# Patient Record
Sex: Female | Born: 1965 | Race: White | Hispanic: No | Marital: Married | Smoking: Former smoker
Health system: Southern US, Community
[De-identification: ages and names within clinical notes are randomized; demographics above are authoritative.]

## PROBLEM LIST (undated history)

## (undated) DIAGNOSIS — Z973 Presence of spectacles and contact lenses: Secondary | ICD-10-CM

## (undated) DIAGNOSIS — G4730 Sleep apnea, unspecified: Secondary | ICD-10-CM

## (undated) DIAGNOSIS — M545 Low back pain: Secondary | ICD-10-CM

## (undated) DIAGNOSIS — N200 Calculus of kidney: Secondary | ICD-10-CM

## (undated) DIAGNOSIS — Z87442 Personal history of urinary calculi: Secondary | ICD-10-CM

## (undated) DIAGNOSIS — K219 Gastro-esophageal reflux disease without esophagitis: Secondary | ICD-10-CM

## (undated) DIAGNOSIS — N209 Urinary calculus, unspecified: Secondary | ICD-10-CM

## (undated) DIAGNOSIS — M1990 Unspecified osteoarthritis, unspecified site: Secondary | ICD-10-CM

## (undated) DIAGNOSIS — K8020 Calculus of gallbladder without cholecystitis without obstruction: Secondary | ICD-10-CM

## (undated) HISTORY — DX: Low back pain: M54.5

## (undated) HISTORY — DX: Calculus of kidney: N20.0

## (undated) HISTORY — PX: DILATION AND CURETTAGE OF UTERUS: SHX78

## (undated) HISTORY — PX: BREAST SURGERY: SHX581

## (undated) HISTORY — PX: TUBAL LIGATION: SHX77

## (undated) HISTORY — DX: Urinary calculus, unspecified: N20.9

---
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
## 1995-05-21 HISTORY — PX: LUMBAR DISC SURGERY: SHX700

## 2000-05-20 HISTORY — PX: OTHER SURGICAL HISTORY: SHX169

## 2004-06-06 ENCOUNTER — Ambulatory Visit: Payer: Self-pay | Admitting: Internal Medicine

## 2004-07-17 ENCOUNTER — Ambulatory Visit: Payer: Self-pay | Admitting: Internal Medicine

## 2004-08-27 ENCOUNTER — Emergency Department: Payer: Self-pay | Admitting: Emergency Medicine

## 2005-02-01 ENCOUNTER — Ambulatory Visit: Payer: Self-pay | Admitting: Family Medicine

## 2005-04-25 ENCOUNTER — Other Ambulatory Visit: Admission: RE | Admit: 2005-04-25 | Discharge: 2005-04-25 | Payer: Self-pay | Admitting: Obstetrics and Gynecology

## 2006-02-17 LAB — HM MAMMOGRAPHY: HM Mammogram: NORMAL

## 2006-03-04 ENCOUNTER — Encounter: Admission: RE | Admit: 2006-03-04 | Discharge: 2006-03-04 | Payer: Self-pay | Admitting: Obstetrics and Gynecology

## 2006-04-17 ENCOUNTER — Other Ambulatory Visit: Admission: RE | Admit: 2006-04-17 | Discharge: 2006-04-17 | Payer: Self-pay | Admitting: Obstetrics and Gynecology

## 2006-05-20 HISTORY — PX: BLADDER SUSPENSION: SHX72

## 2006-08-18 ENCOUNTER — Ambulatory Visit (HOSPITAL_COMMUNITY): Admission: RE | Admit: 2006-08-18 | Discharge: 2006-08-19 | Payer: Self-pay | Admitting: Obstetrics and Gynecology

## 2006-10-15 ENCOUNTER — Encounter: Payer: Self-pay | Admitting: Family Medicine

## 2007-02-25 ENCOUNTER — Ambulatory Visit: Payer: Self-pay | Admitting: Family Medicine

## 2007-02-25 DIAGNOSIS — M542 Cervicalgia: Secondary | ICD-10-CM | POA: Insufficient documentation

## 2007-02-25 DIAGNOSIS — N209 Urinary calculus, unspecified: Secondary | ICD-10-CM | POA: Insufficient documentation

## 2007-02-25 DIAGNOSIS — R0789 Other chest pain: Secondary | ICD-10-CM

## 2007-02-26 ENCOUNTER — Encounter: Payer: Self-pay | Admitting: Family Medicine

## 2007-03-02 ENCOUNTER — Ambulatory Visit: Payer: Self-pay | Admitting: Family Medicine

## 2007-03-03 LAB — CONVERTED CEMR LAB
Bilirubin, Direct: 0.1 mg/dL (ref 0.0–0.3)
Calcium: 9 mg/dL (ref 8.4–10.5)
Cholesterol: 178 mg/dL (ref 0–200)
GFR calc Af Amer: 89 mL/min
GFR calc non Af Amer: 74 mL/min
Glucose, Bld: 88 mg/dL (ref 70–99)
LDL Cholesterol: 101 mg/dL — ABNORMAL HIGH (ref 0–99)
Potassium: 4.7 meq/L (ref 3.5–5.1)
Sodium: 145 meq/L (ref 135–145)
Total CHOL/HDL Ratio: 3.7

## 2007-04-07 ENCOUNTER — Encounter: Admission: RE | Admit: 2007-04-07 | Discharge: 2007-04-07 | Payer: Self-pay | Admitting: Obstetrics and Gynecology

## 2007-04-29 DIAGNOSIS — N3000 Acute cystitis without hematuria: Secondary | ICD-10-CM | POA: Insufficient documentation

## 2007-05-02 ENCOUNTER — Encounter: Payer: Self-pay | Admitting: Family Medicine

## 2007-05-02 ENCOUNTER — Ambulatory Visit: Payer: Self-pay | Admitting: Family Medicine

## 2007-05-02 LAB — CONVERTED CEMR LAB
Bilirubin Urine: NEGATIVE
Blood in Urine, dipstick: NEGATIVE
Glucose, Urine, Semiquant: NEGATIVE
Ketones, urine, test strip: NEGATIVE
Specific Gravity, Urine: <1.005
pH: 7

## 2007-05-30 ENCOUNTER — Ambulatory Visit: Payer: Self-pay | Admitting: Family Medicine

## 2007-05-30 DIAGNOSIS — G4730 Sleep apnea, unspecified: Secondary | ICD-10-CM | POA: Insufficient documentation

## 2007-05-30 DIAGNOSIS — J069 Acute upper respiratory infection, unspecified: Secondary | ICD-10-CM | POA: Insufficient documentation

## 2007-06-03 ENCOUNTER — Ambulatory Visit: Payer: Self-pay | Admitting: Family Medicine

## 2007-10-07 ENCOUNTER — Emergency Department (HOSPITAL_COMMUNITY): Admission: EM | Admit: 2007-10-07 | Discharge: 2007-10-07 | Payer: Self-pay | Admitting: Family Medicine

## 2007-10-07 ENCOUNTER — Encounter: Admission: RE | Admit: 2007-10-07 | Discharge: 2007-10-07 | Payer: Self-pay | Admitting: Obstetrics and Gynecology

## 2008-02-17 ENCOUNTER — Ambulatory Visit: Payer: Self-pay | Admitting: Family Medicine

## 2008-02-17 DIAGNOSIS — S139XXA Sprain of joints and ligaments of unspecified parts of neck, initial encounter: Secondary | ICD-10-CM

## 2008-04-25 ENCOUNTER — Encounter: Admission: RE | Admit: 2008-04-25 | Discharge: 2008-04-25 | Payer: Self-pay | Admitting: Obstetrics and Gynecology

## 2008-05-23 ENCOUNTER — Ambulatory Visit: Payer: Self-pay | Admitting: Family Medicine

## 2008-05-23 DIAGNOSIS — Z87442 Personal history of urinary calculi: Secondary | ICD-10-CM

## 2008-05-23 DIAGNOSIS — R1011 Right upper quadrant pain: Secondary | ICD-10-CM

## 2008-05-23 LAB — CONVERTED CEMR LAB
ALT: 48 units/L — ABNORMAL HIGH (ref 0–35)
AST: 40 units/L — ABNORMAL HIGH (ref 0–37)
Basophils Relative: 0.2 % (ref 0.0–3.0)
Bilirubin, Direct: 0.1 mg/dL (ref 0.0–0.3)
CO2: 27 meq/L (ref 19–32)
Calcium: 9.6 mg/dL (ref 8.4–10.5)
Chloride: 105 meq/L (ref 96–112)
Creatinine, Ser: 0.8 mg/dL (ref 0.4–1.2)
Glucose, Bld: 88 mg/dL (ref 70–99)
Hemoglobin: 15.9 g/dL — ABNORMAL HIGH (ref 12.0–15.0)
Lymphocytes Relative: 25.4 % (ref 12.0–46.0)
Monocytes Relative: 13.9 % — ABNORMAL HIGH (ref 3.0–12.0)
Neutro Abs: 5.8 10*3/uL (ref 1.4–7.7)
Neutrophils Relative %: 58.3 % (ref 43.0–77.0)
RBC: 4.92 M/uL (ref 3.87–5.11)
Sodium: 139 meq/L (ref 135–145)
Total Protein: 7.3 g/dL (ref 6.0–8.3)

## 2008-05-24 ENCOUNTER — Encounter: Payer: Self-pay | Admitting: Family Medicine

## 2008-05-24 ENCOUNTER — Encounter: Admission: RE | Admit: 2008-05-24 | Discharge: 2008-05-24 | Payer: Self-pay | Admitting: Family Medicine

## 2008-05-27 ENCOUNTER — Ambulatory Visit: Payer: Self-pay | Admitting: Family Medicine

## 2008-05-27 DIAGNOSIS — R740 Nonspecific elevation of levels of transaminase and lactic acid dehydrogenase [LDH]: Secondary | ICD-10-CM

## 2008-06-10 ENCOUNTER — Ambulatory Visit: Payer: Self-pay | Admitting: Family Medicine

## 2008-06-14 LAB — CONVERTED CEMR LAB
AST: 15 units/L (ref 0–37)
Albumin: 4.2 g/dL (ref 3.5–5.2)
Alkaline Phosphatase: 91 units/L (ref 39–117)
HCV Ab: NEGATIVE
Hepatitis B Surface Ag: NEGATIVE
Indirect Bilirubin: 0.2 mg/dL (ref 0.0–0.9)
Total Bilirubin: 0.3 mg/dL (ref 0.3–1.2)

## 2009-04-27 ENCOUNTER — Ambulatory Visit: Payer: Self-pay | Admitting: Family Medicine

## 2009-04-27 DIAGNOSIS — R197 Diarrhea, unspecified: Secondary | ICD-10-CM

## 2009-04-27 DIAGNOSIS — R109 Unspecified abdominal pain: Secondary | ICD-10-CM | POA: Insufficient documentation

## 2009-04-27 LAB — CONVERTED CEMR LAB
Ketones, urine, test strip: NEGATIVE
Nitrite: NEGATIVE
Protein, U semiquant: NEGATIVE
Specific Gravity, Urine: 1.01
Urobilinogen, UA: 0.2
WBC Urine, dipstick: NEGATIVE

## 2009-04-28 LAB — CONVERTED CEMR LAB
ALT: 28 units/L (ref 0–35)
BUN: 10 mg/dL (ref 6–23)
Bilirubin, Direct: 0 mg/dL (ref 0.0–0.3)
CO2: 27 meq/L (ref 19–32)
Chloride: 104 meq/L (ref 96–112)
Creatinine, Ser: 0.9 mg/dL (ref 0.4–1.2)
Eosinophils Absolute: 0.2 10*3/uL (ref 0.0–0.7)
Eosinophils Relative: 2 % (ref 0.0–5.0)
Glucose, Bld: 83 mg/dL (ref 70–99)
HCT: 44.1 % (ref 36.0–46.0)
Lipase: 12 units/L (ref 11.0–59.0)
Lymphs Abs: 2.5 10*3/uL (ref 0.7–4.0)
MCHC: 33.9 g/dL (ref 30.0–36.0)
MCV: 94.7 fL (ref 78.0–100.0)
Monocytes Absolute: 1 10*3/uL (ref 0.1–1.0)
Neutrophils Relative %: 60.4 % (ref 43.0–77.0)
Platelets: 340 10*3/uL (ref 150.0–400.0)
Potassium: 4.5 meq/L (ref 3.5–5.1)
TSH: 1.83 microintl units/mL (ref 0.35–5.50)
Total Bilirubin: 0.8 mg/dL (ref 0.3–1.2)
Total Protein: 7 g/dL (ref 6.0–8.3)

## 2009-06-19 ENCOUNTER — Encounter: Admission: RE | Admit: 2009-06-19 | Discharge: 2009-06-19 | Payer: Self-pay | Admitting: Obstetrics and Gynecology

## 2009-11-14 ENCOUNTER — Telehealth: Payer: Self-pay | Admitting: Family Medicine

## 2010-06-09 ENCOUNTER — Other Ambulatory Visit: Payer: Self-pay | Admitting: Obstetrics and Gynecology

## 2010-06-09 DIAGNOSIS — Z1239 Encounter for other screening for malignant neoplasm of breast: Secondary | ICD-10-CM

## 2010-06-19 NOTE — Progress Notes (Signed)
Summary: abdominal pain, nausea  Phone Note Call from Patient Call back at Work Phone [phone]   Caller: Patient Call For: Kerby Nora MD Summary of Call: Patient called asking for an appt for today. She is having abdominal pain that radiates to her side, nausea, and lots of burping. She says that it is the same pain she was having on 04-27-09 when she was seen. Can you see her today? Initial call taken by: Melody Comas,  November 14, 2009 9:36 AM  Follow-up for Phone Call        Add her on to today's schedule..see if she can come early and wait as we try to work her in.  Follow-up by: Kerby Nora MD,  November 14, 2009 1:09 PM  Additional Follow-up for Phone Call Additional follow up Details #1::        Tried calling patient at work number that she left me, they said seh had left for the day. Looked in ID[employer] for another contact, found cell number and got no answer. Will try again later.  Melody Comas  November 14, 2009 1:17 PM   Tried calling again, left message w/ daughter for her to return my call.  Additional Follow-up by: Melody Comas,  November 14, 2009 2:00 PM

## 2010-06-25 ENCOUNTER — Ambulatory Visit
Admission: RE | Admit: 2010-06-25 | Discharge: 2010-06-25 | Disposition: A | Discharge status: Home / Self Care | Payer: 59 | Source: Ambulatory Visit | Attending: Obstetrics and Gynecology | Admitting: Obstetrics and Gynecology

## 2010-06-25 DIAGNOSIS — Z1239 Encounter for other screening for malignant neoplasm of breast: Secondary | ICD-10-CM

## 2010-10-05 NOTE — Op Note (Signed)
Kelsey Carlson, Kelsey Carlson             ACCOUNT NO.:  [id number]   MEDICAL RECORD NO.:  [id number]          PATIENT TYPE:  OIB   LOCATION:  9316                          FACILITY:  [facility]   PHYSICIAN:  Osborn Coho, M.D.   DATE OF BIRTH:  [birth year]   DATE OF PROCEDURE:  08/18/2006  DATE OF DISCHARGE:  08/19/2006                               OPERATIVE REPORT   PREOPERATIVE DIAGNOSIS:  Stress urinary incontinence.   POSTOPERATIVE DIAGNOSIS:  Stress urinary incontinence.   PROCEDURE:  1. TVT.  2. Cystoscopy.   ATTENDING:  Osborn Coho, M.D.   ASSISTANT:  Dois Davenport A. Rivard, M.D.   ANESTHESIA:  General with endotracheal tube.   FLUIDS:  1000 mL.   URINE OUTPUT:  50 mL.   ESTIMATED BLOOD LOSS:  50 mL   COMPLICATIONS:  None.   PROCEDURE:  The patient is taken to the operating room after risks,  benefits, alternatives reviewed with the patient.  The patient  verbalized understanding and consent signed and witnessed.  The patient  was placed under general anesthesia and prepped and draped in normal  sterile fashion in the dorsal lithotomy position.  The anterior vaginal  wall was injected with Pitressin and a weighted speculum placed.  The  dilute Pitressin was injected at the level of the mid urethra in the  anterior vaginal wall.  The mons pubis was identified and two  fingerbreadths from the midline bilaterally at the upper edge of the  symphysis pubis two 5 mm incisions were made.  An incision was made as  well for approximately a centimeter along the mid urethra.  The anterior  vaginal wall was dissected away from the underlying tissue bilaterally  to the lower border of the symphysis pubis.  The bladder was emptied  completely and a rigid urethral catheter guide and an 18-French Foley  was deflected to the patient's right and the transabdominal guide passed  from the mons pubis incision on the ipsilateral side through the  anterior vaginal wall where the dissected path was  created.  The same  was done on the contralateral side.  Cystoscopy was performed and no  inadvertent bladder injury was noted.  The mesh was then going to be  attached to the transabdominal guide on the right however, it was noted  that the guide had punctured through the anterior vaginal wall therefore  the transabdominal guide was pulled back just a little bit and placed  through the appropriate path and the mesh was attached.  The bladder was  emptied completely and again the rigid urethral guide in the 18-French  Foley was deflected to the patient's right and mesh elevated through the  space of Retzius and out through the incision on the mons pubis.  This  was done on the contralateral side as well.  Cystoscopy was performed  once again and no inadvertent bladder injury was noted.  The ureters  were noted to efflux bilaterally after administration of indigo carmine.  A large Jenaveve was placed beneath the urethra and between the urethra and  mesh in order to keep it somewhat slack beneath the  mid urethra and the  plastic sheath was removed bilaterally.  The Foley was then placed into  the bladder; however, prior to leaving the Foley in, cystoscopy was  performed once again and no bladder injury noted.  The Foley was then  placed in the bladder and attached to the Foley bag to gravity.  Each  time there was entry into the bladder with either the catheter or  cystoscope, the catheter and cystoscope was prepped with Betadine as  well as the urethra.  The mesh was then cut flush with the incision  bilaterally at the mons pubis and the anterior vaginal wall was repaired  with 2-0 Vicryl via a running interlocking stitch.  The puncture site in  the right anterior vaginal wall was repaired with 2-0 Vicryl via a  running interlocking stitch as well.  The vagina was packed with a 1-  inch packing soaked with estrogen and the mons pubis incisions were  repaired with Dermabond.  Sponge, lap and  needle count was correct.  The  patient tolerated procedure well and was returned to recovery room in  good condition.      Osborn Coho, M.D.  Electronically Signed     AR/MEDQ  D:  08/20/2006  T:  08/20/2006  Job:  [id number]

## 2010-10-05 NOTE — Discharge Summary (Signed)
Kelsey Carlson, Kelsey Carlson             ACCOUNT NO.:  [id number]   MEDICAL RECORD NO.:  [id number]          PATIENT TYPE:  OIB   LOCATION:  9316                          FACILITY:  [facility]   PHYSICIAN:  Osborn Coho, M.D.   DATE OF BIRTH:  [birth year]   DATE OF ADMISSION:  08/18/2006  DATE OF DISCHARGE:  08/19/2006                               DISCHARGE SUMMARY   DISCHARGE DIAGNOSIS:  Stress urinary incontinence.   OPERATION:  On the day of admission the patient underwent the placement  of tension-free vaginal tape and cystoscopy tolerating procedure well.   HISTORY OF PRESENT ILLNESS:  Kelsey Carlson is a 45 year old female  gravida 3, para 2 who presents with symptoms of stress urinary  incontinence requesting surgical intervention.  Please see the patient's  dictated history and physical examination for details.  General exam is  within normal limits.  Pelvic exam external genitalia within normal  limits.  There is no obvious urethrocele however, a small cystocele is  beginning.  There is no rectocele.  Uterus is normal size, nontender  without any palpable adnexal masses.   HOSPITAL COURSE:  On the day of admission the patient underwent  aforementioned procedures tolerating them all well.  By postop day #1  the patient had resumed bowel and bladder function and was therefore  deemed ready for discharge home.   DISCHARGE MEDICATIONS:  Motrin (over-the-counter) as needed and as  directed.  Vicodin 1-2 tablets every 4 hours as needed for breakthrough  bleeding, amoxicillin 300 mg three times daily for 5 days.   FOLLOW UP:  The patient is scheduled for 6 weeks postoperative visit  with Dr. Osborn Coho.  She is to call with [facility] at  [phone] to schedule that appointment.   DISCHARGE INSTRUCTIONS:  The patient was given a copy of [facility] postoperative instruction sheet.  She was further  advised to avoid driving for 24 hours, lifting for 6 weeks  anything  greater than 10-15 pounds, intercourse for 6 weeks, that she may shower.  She should increase her activities slowly.  Her diet was without  restriction.      Kelsey Carlson.      Osborn Coho, M.D.  Electronically Signed    EJP/MEDQ  D:  09/19/2006  T:  09/19/2006  Job:  [id number]

## 2010-10-05 NOTE — H&P (Signed)
Kelsey Carlson, Kelsey Carlson             ACCOUNT NO.:  [id number]   MEDICAL RECORD NO.:  [id number]          PATIENT TYPE:  OIB   LOCATION:  9316                          FACILITY:  [facility]   PHYSICIAN:  Osborn Coho, M.D.   DATE OF BIRTH:  1965/07/05   DATE OF ADMISSION:  08/18/2006  DATE OF DISCHARGE:  08/19/2006                              HISTORY & PHYSICAL   CHIEF COMPLAINT:  Leaking urine with activity, coughing, laughing and  sneezing.   HISTORY:  Ms. Maul is a 45 year old gravida 3, para 2, with last  menstrual period of August 06, 2006, with complaints of leaking urine  with activity, coughing, laughing and sneezing.  Options were discussed  and the patient would like to proceed with surgical management.   PAST OBSTETRICAL HISTORY:  1. Normal spontaneous vaginal delivery full-term x2.  2. Spontaneous abortion x1, first trimester.   PAST GYNECOLOGIC HISTORY:  History of regular menses, denies gonorrhea,  chlamydia.  Reports a positive history of yeast infections and UTIs.  Denies history of abnormal Pap smear.   PAST MEDICAL HISTORY:  Denies.   PAST SURGICAL HISTORY:  1. 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  2. Back surgery secondary to herniated disk.  3. 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  4. Wisdom teeth extraction.  5. D&C.   MEDICATIONS:  Mircette and Vicodin p.r.n.   ALLERGIES:  BACTRIM (upset stomach).   SOCIAL HISTORY:  Denies cigarette use, rare alcohol use, and no drug  use.  Lives with husband and two children and brother.   FAMILY HISTORY:  Mother died of breast cancer at 41 years old.   REVIEW OF SYSTEMS:  Denies chest pain or shortness of breath with  activity.  Denies any fever, chills, nausea or vomiting, GI or GU  problems except for what was mentioned above.   PHYSICAL EXAMINATION:  HEENT:  Within normal limits.  NECK:  Thyroid not enlarged.  CARDIAC:  Heart rate and rhythm are regular.  CHEST:  Clear to auscultation  bilaterally.  BREASTS:  No masses, discharge, skin changes or nipple retraction.  BACK:  No CVA tenderness.  ABDOMEN:  Soft and nontender.  EXTREMITIES:  Within normal limits.  PELVIC:  External genitalia within normal limits.  No obvious  urethrocele, however a small cystocele beginning.  No rectocele.  Uterus  normal size, nontender, no palpable adnexal masses.   Office cystometrics:  PVR less than 5 mL.  Q-Tip baseline 30 mL and  going to 60 degrees in addition to the 30, leak with cough at 60 mL.  Bladder capacity greater than 300 mL.   ASSESSMENT AND PLAN:  Ms. Buxbaum is a 45 year old gravida 3, para 2,  with stress urinary incontinence desiring surgical management.  The  risks, benefits, and alternatives have been discussed with the patient  including but not limited to bleeding, infection, injury to adjacent  organs, erosion of mesh, and urinary retention.  The patient verbalized  understanding, questions have been answered, and consent to be signed  and witnessed the day of procedure in hospital.      Osborn Coho,  M.D.  Electronically Signed     AR/MEDQ  D:  08/20/2006  T:  08/20/2006  Job:  [id number]

## 2010-10-22 ENCOUNTER — Ambulatory Visit (INDEPENDENT_AMBULATORY_CARE_PROVIDER_SITE_OTHER): Payer: Self-pay | Admitting: Family Medicine

## 2010-10-22 ENCOUNTER — Encounter: Payer: Self-pay | Admitting: Family Medicine

## 2010-10-22 DIAGNOSIS — R0789 Other chest pain: Secondary | ICD-10-CM

## 2010-10-22 DIAGNOSIS — R079 Chest pain, unspecified: Secondary | ICD-10-CM

## 2010-10-22 DIAGNOSIS — R5383 Other fatigue: Secondary | ICD-10-CM

## 2010-10-22 DIAGNOSIS — Z136 Encounter for screening for cardiovascular disorders: Secondary | ICD-10-CM

## 2010-10-22 LAB — COMPREHENSIVE METABOLIC PANEL
ALT: 18 U/L (ref 0–35)
AST: 17 U/L (ref 0–37)
Alkaline Phosphatase: 61 U/L (ref 39–117)
BUN: 11 mg/dL (ref 6–23)
Calcium: 9.3 mg/dL (ref 8.4–10.5)
Chloride: 107 mEq/L (ref 96–112)
Creatinine, Ser: 0.9 mg/dL (ref 0.4–1.2)
GFR: 69.56 mL/min (ref >60.00)
Potassium: 4.6 mEq/L (ref 3.5–5.1)

## 2010-10-22 LAB — CBC WITH DIFFERENTIAL/PLATELET
Basophils Absolute: 0 10*3/uL (ref 0.0–0.1)
Hemoglobin: 14.8 g/dL (ref 12.0–15.0)
Lymphocytes Relative: 27 % (ref 12.0–46.0)
Monocytes Relative: 9.6 % (ref 3.0–12.0)
Neutrophils Relative %: 61.2 % (ref 43.0–77.0)
Platelets: 350 10*3/uL (ref 150.0–400.0)
RDW: 12.3 % (ref 11.5–14.6)

## 2010-10-22 LAB — TSH: TSH: 1.96 u[IU]/mL (ref 0.35–5.50)

## 2010-10-22 LAB — VITAMIN B12: Vitamin B-12: 193 pg/mL — ABNORMAL LOW (ref 211–911)

## 2010-10-22 NOTE — Progress Notes (Signed)
Subjective:    Patient ID: Kelsey Carlson, female    DOB: [birth year], 45 y.o.   MRN: [id number]  Chest Pain  This is a new (HAs been tired for 3 weeks, had noticed lid puffiness of ankles) problem. The current episode started yesterday. The onset quality is gradual (left chest achiness). The problem occurs intermittently. The problem has been unchanged. The pain is present in the substernal region. The pain is mild. Quality: achiness lasts several hours. The pain radiates to the left arm (feels pressure). Associated symptoms include back pain, dizziness, nausea and shortness of breath. Pertinent negatives include no cough, exertional chest pressure, fever, headaches, near-syncope, numbness, orthopnea, palpitations, syncope, vomiting or weakness. Associated symptoms comments: Has recently been on estrogen patch  No association with exertion or eating. Woke up this AM with achiness in chest already present Feels at little anxious today, but no recent anxiety or depression. The pain is aggravated by nothing. She has tried nothing for the symptoms. Risk factors include obesity, smoking/tobacco exposure, sedentary lifestyle and oral contraceptive use (was on estrogen patch for menstrual migrane last week).  Pertinent negatives for past medical history include no CAD, no COPD, no diabetes, no hyperlipidemia, no hypertension, no MI, no PE, no PVD, no thyroid problem and no TIA.  Her family medical history is significant for hyperlipidemia in family and hypertension in family.  Pertinent negatives for family medical history include: no CAD in family, no diabetes in family, no early MI in family, no PE in family and no PVD in family.      Review of Systems  Constitutional: Negative for fever.  HENT: Negative for congestion and postnasal drip.   Respiratory: Positive for shortness of breath. Negative for cough.   Cardiovascular: Positive for chest pain. Negative for palpitations, orthopnea, syncope and  near-syncope.  Gastrointestinal: Positive for nausea. Negative for vomiting, diarrhea and constipation.       No GERD, occ has gallbladder pain  Genitourinary: Negative for dysuria.  Musculoskeletal: Positive for back pain.  Neurological: Positive for dizziness. Negative for weakness, numbness and headaches.       Objective:   Physical Exam  Constitutional: Vital signs are normal. She appears well-developed and well-nourished. She is cooperative.  Non-toxic appearance. She does not appear ill. No distress.  HENT:  Head: Normocephalic.  Right Ear: Hearing, tympanic membrane, external ear and ear canal normal. Tympanic membrane is not erythematous, not retracted and not bulging.  Left Ear: Hearing, tympanic membrane, external ear and ear canal normal. Tympanic membrane is not erythematous, not retracted and not bulging.  Nose: No mucosal edema or rhinorrhea. Right sinus exhibits no maxillary sinus tenderness and no frontal sinus tenderness. Left sinus exhibits no maxillary sinus tenderness and no frontal sinus tenderness.  Mouth/Throat: Uvula is midline, oropharynx is clear and moist and mucous membranes are normal.  Eyes: Conjunctivae, EOM and lids are normal. Pupils are equal, round, and reactive to light. No foreign bodies found.  Neck: Trachea normal and normal range of motion. Neck supple. Carotid bruit is not present. No mass and no thyromegaly present.  Cardiovascular: Normal rate, regular rhythm, S1 normal, S2 normal, normal heart sounds, intact distal pulses and normal pulses.  Exam reveals no gallop and no friction rub.   No murmur heard. Pulmonary/Chest: Effort normal and breath sounds normal. Not tachypneic. No respiratory distress. She has no decreased breath sounds. She has no wheezes. She has no rhonchi. She has no rales.  Abdominal: Soft. Normal appearance and bowel sounds are  normal. There is no splenomegaly or hepatomegaly. There is tenderness in the right upper quadrant and  epigastric area.  Neurological: She is alert.  Skin: Skin is warm, dry and intact. No rash noted.  Psychiatric: Her speech is normal and behavior is normal. Judgment and thought content normal. Her mood appears not anxious. Cognition and memory are normal. She does not exhibit a depressed mood.          Assessment & Plan:

## 2010-10-22 NOTE — Patient Instructions (Signed)
If severe shortness of breath or chest pain, go to ER. We will call you with lab results.  Start prilosec 40 mg daily for possible GI symptoms. Call if symptoms not improving in next 1-2 weeks for further evaluation.

## 2010-10-22 NOTE — Assessment & Plan Note (Addendum)
Lower risk for CAD (no fam hx MI, no DM, no high chol, former smoker, no HTN, overweight) Description of pain is non exertional, but she is also having some shortness of breath with exertion and fatigue in last few weeks. On exam only finding is mild epigastric ttp. EKG showed NSR, no ST changes, no Q waves, no LVH.  Will eval fatigue/SOB with labs.  Start on prilosec 40 mg daily for possible GI source of symptoms.

## 2011-01-03 ENCOUNTER — Other Ambulatory Visit: Payer: Self-pay | Admitting: Obstetrics and Gynecology

## 2011-01-03 DIAGNOSIS — N63 Unspecified lump in unspecified breast: Secondary | ICD-10-CM

## 2011-01-09 ENCOUNTER — Ambulatory Visit
Admission: RE | Admit: 2011-01-09 | Discharge: 2011-01-09 | Disposition: A | Discharge status: Home / Self Care | Payer: 59 | Source: Ambulatory Visit | Attending: Obstetrics and Gynecology | Admitting: Obstetrics and Gynecology

## 2011-01-09 DIAGNOSIS — N63 Unspecified lump in unspecified breast: Secondary | ICD-10-CM

## 2011-02-13 LAB — POCT URINALYSIS DIP (DEVICE)
Bilirubin Urine: NEGATIVE
Glucose, UA: NEGATIVE
Nitrite: POSITIVE — AB
Operator id: 247071
Urobilinogen, UA: 0.2

## 2011-02-13 LAB — URINE CULTURE: Colony Count: >=100000

## 2011-07-11 ENCOUNTER — Other Ambulatory Visit: Payer: Self-pay | Admitting: Obstetrics and Gynecology

## 2011-07-11 DIAGNOSIS — Z1231 Encounter for screening mammogram for malignant neoplasm of breast: Secondary | ICD-10-CM

## 2011-07-24 ENCOUNTER — Ambulatory Visit
Admission: RE | Admit: 2011-07-24 | Discharge: 2011-07-24 | Disposition: A | Discharge status: Home / Self Care | Payer: 59 | Source: Ambulatory Visit | Attending: Obstetrics and Gynecology | Admitting: Obstetrics and Gynecology

## 2011-07-24 DIAGNOSIS — Z1231 Encounter for screening mammogram for malignant neoplasm of breast: Secondary | ICD-10-CM

## 2011-09-19 ENCOUNTER — Ambulatory Visit: Payer: Self-pay | Admitting: Obstetrics and Gynecology

## 2011-10-02 ENCOUNTER — Encounter: Payer: Self-pay | Admitting: Family Medicine

## 2011-10-02 ENCOUNTER — Ambulatory Visit (INDEPENDENT_AMBULATORY_CARE_PROVIDER_SITE_OTHER): Payer: 59 | Admitting: Family Medicine

## 2011-10-02 VITALS — BP 110/70 | HR 90 | Temp 98.3°F | Ht 64.5 in | Wt 187.8 lb

## 2011-10-02 DIAGNOSIS — J069 Acute upper respiratory infection, unspecified: Secondary | ICD-10-CM

## 2011-10-02 MED ORDER — HYDROCODONE-HOMATROPINE 5-1.5 MG/5ML PO SYRP
ORAL_SOLUTION | ORAL | Status: AC
Start: 2011-10-02 — End: 2011-10-12

## 2011-10-02 NOTE — Progress Notes (Signed)
  Patient Name: Kelsey Carlson Date of Birth: 1965-07-27 Age: 46 y.o. Medical Record Number: [id number] Gender: female Date of Encounter: 10/02/2011  History of Present Illness:  Kelsey Carlson is a 46 y.o. very pleasant female patient who presents with the following:  Chest is tight and cannot breathe. Nonproductive   No asthma history. No smoker. Quit smoking in 20's Sick for almost a week  Pleasant lady with a history of a nonproductive cough. Some chest tightness. No significant fever. Minimal rhinorrhea. No significant sore throat or earache. No nausea, vomiting, or diarrhea.  Past Medical History, Surgical History, Social History, Family History, Problem List, Medications, and Allergies have been reviewed and updated if relevant.  Review of Systems: ROS: GEN: Acute illness details above GI: Tolerating PO intake GU: maintaining adequate hydration and urination Pulm: No SOB Interactive and getting along well at home.  Otherwise, ROS is as per the HPI.   Physical Examination: Filed Vitals:   10/02/11 1144  BP: 110/70  Pulse: 90  Temp: 98.3 F (36.8 C)  TempSrc: Oral  Height: 5' 4.5" (1.638 m)  Weight: 187 lb 12.8 oz (85.186 kg)  SpO2: 90%   Pulse ox 97%  Kelsey Schwall, MD 10/02/2011, 12:31 PM   Body mass index is 31.74 kg/(m^2).   Gen: WDWN, NAD; A & O x3, cooperative. Pleasant.Globally Non-toxic HEENT: Normocephalic and atraumatic. Throat clear, w/o exudate, R TM clear, L TM - good landmarks, No fluid present. rhinnorhea.  MMM Frontal sinuses: NT Max sinuses: NT NECK: Anterior cervical  LAD is absent CV: RRR, No M/G/R, cap refill <2 sec PULM: Breathing comfortably in no respiratory distress. no wheezing, crackles, rhonchi EXT: No c/c/e PSYCH: Friendly, good eye contact MSK: Nml gait    Assessment and Plan:  URI I discussed upper respiratory tract infections with the patient and explained viral infections in general.  Recommended plenty of  sleep. Symptomatic care with pushing fluids. Oral acetaminophen or NSAIDs as tolerated for body aches, chills, fevers.  follow-up if acutely worsens  Hycodan prn at night

## 2011-10-08 ENCOUNTER — Encounter: Payer: Self-pay | Admitting: Obstetrics and Gynecology

## 2011-10-08 ENCOUNTER — Ambulatory Visit (INDEPENDENT_AMBULATORY_CARE_PROVIDER_SITE_OTHER): Payer: 59 | Admitting: Obstetrics and Gynecology

## 2011-10-08 VITALS — BP 110/58 | Ht 64.0 in | Wt 189.0 lb

## 2011-10-08 DIAGNOSIS — Z124 Encounter for screening for malignant neoplasm of cervix: Secondary | ICD-10-CM

## 2011-10-08 DIAGNOSIS — Z01419 Encounter for gynecological examination (general) (routine) without abnormal findings: Secondary | ICD-10-CM

## 2011-10-08 NOTE — Progress Notes (Signed)
The patient reports:normal menses, no abnormal bleeding, pelvic pain or discharge  Contraception:bilateral tubal ligation  Last mammogram: was normal March 2013 Last pap: was normal May  2012  GC/Chlamydia cultures offered: declined HIV/RPR/HbsAg offered:  declined HSV 1 and 2 glycoprotein offered: declined  Menstrual cycle regular and monthly: Yes Menstrual flow normal: Yes  Urinary symptoms: none Normal bowel movements: Yes Reports abuse at home: No  Subjective:    Kelsey Carlson is a 46 y.o. female, G3P3, who presents for an annual exam. Reports menstrual migraines are well-controlled with Climara on menstrual week. Patient has no complaints today.    History   Social History  . Marital Status: Married    Spouse Name: N/A    Number of Children: N/A  . Years of Education: N/A   Occupational History  .  [employer]    Mailroom   Social History Main Topics  . Smoking status: Former Games developer  . Smokeless tobacco: Never Used  . Alcohol Use: Yes  . Drug Use: No  . Sexually Active: Yes    Birth Control/ Protection: Surgical     Essure BTL   Other Topics Concern  . None   Social History Narrative   Regular exercise- yes 3 x a weekDiet- fruits and veggies    Menstrual cycle:   LMP: Patient's last menstrual period was 09/15/2011.           Cycle: normal  The following portions of the patient's history were reviewed and updated as appropriate: allergies, current medications, past family history, past medical history, past social history, past surgical history and problem list.  Review of Systems Pertinent items are noted in HPI. Breast:Negative for breast lump,nipple discharge or nipple retraction Gastrointestinal: Negative for abdominal pain, change in bowel habits or rectal bleeding Urinary:negative   Objective:    BP 110/58  Ht 5\' 4"  (1.626 m)  Wt 189 lb (85.73 kg)  BMI 32.44 kg/m2  LMP 09/15/2011    Weight:  Wt Readings from Last 1 Encounters:    10/08/11 189 lb (85.73 kg)          BMI: Body mass index is 32.44 kg/(m^2).  General Appearance: Alert, appropriate appearance for age. No acute distress HEENT: Grossly normal Neck / Thyroid: Supple, no masses, nodes or enlargement Lungs: clear to auscultation bilaterally Back: No CVA tenderness Breast Exam: No masses or nodes.No dimpling, nipple retraction or discharge. Cardiovascular: Regular rate and rhythm. S1, S2, no murmur Gastrointestinal: Soft, non-tender, no masses or organomegaly Pelvic Exam: Vulva and vagina appear normal. Bimanual exam reveals normal uterus and adnexa. Rectovaginal: normal rectal, no masses Lymphatic Exam: Non-palpable nodes in neck, clavicular, axillary, or inguinal regions Skin: no rash or abnormalities Neurologic: Normal gait and speech, no tremor  Psychiatric: Alert and oriented, appropriate affect.     Assessment:    Normal gyn exam    Plan:    mammogram pap smear return annually or prn STD screening: declined Contraception:Essure      Crystian Frith AMD

## 2011-10-11 LAB — PAP IG W/ RFLX HPV ASCU

## 2012-01-09 ENCOUNTER — Other Ambulatory Visit (INDEPENDENT_AMBULATORY_CARE_PROVIDER_SITE_OTHER): Payer: 59

## 2012-01-09 ENCOUNTER — Telehealth: Payer: Self-pay | Admitting: Family Medicine

## 2012-01-09 DIAGNOSIS — Z13220 Encounter for screening for lipoid disorders: Secondary | ICD-10-CM

## 2012-01-09 LAB — COMPREHENSIVE METABOLIC PANEL
ALT: 17 U/L (ref 0–35)
Albumin: 4 g/dL (ref 3.5–5.2)
Alkaline Phosphatase: 59 U/L (ref 39–117)
CO2: 26 mEq/L (ref 19–32)
Glucose, Bld: 81 mg/dL (ref 70–99)
Potassium: 4.1 mEq/L (ref 3.5–5.1)
Sodium: 139 mEq/L (ref 135–145)
Total Protein: 7.1 g/dL (ref 6.0–8.3)

## 2012-01-09 LAB — LIPID PANEL
Cholesterol: 200 mg/dL (ref 0–200)
LDL Cholesterol: 121 mg/dL — ABNORMAL HIGH (ref 0–99)
VLDL: 24.4 mg/dL (ref 0.0–40.0)

## 2012-01-09 NOTE — Telephone Encounter (Signed)
Message copied by Excell Seltzer on Thu Jan 09, 2012  1:12 AM ------      Message from: Alvina Chou      Created: Fri Jan 03, 2012  9:59 AM      Regarding: Lab orders for Thursday, 8.22.13       Patient is scheduled for CPX labs, please order future labs, Thanks , Camelia Eng

## 2012-01-16 ENCOUNTER — Encounter: Payer: Self-pay | Admitting: Family Medicine

## 2012-01-16 ENCOUNTER — Ambulatory Visit (INDEPENDENT_AMBULATORY_CARE_PROVIDER_SITE_OTHER): Payer: 59 | Admitting: Family Medicine

## 2012-01-16 VITALS — BP 120/76 | HR 82 | Temp 98.5°F | Ht 64.0 in | Wt 190.5 lb

## 2012-01-16 DIAGNOSIS — R1030 Lower abdominal pain, unspecified: Secondary | ICD-10-CM | POA: Insufficient documentation

## 2012-01-16 DIAGNOSIS — R109 Unspecified abdominal pain: Secondary | ICD-10-CM

## 2012-01-16 DIAGNOSIS — F329 Major depressive disorder, single episode, unspecified: Secondary | ICD-10-CM

## 2012-01-16 DIAGNOSIS — F3340 Major depressive disorder, recurrent, in remission, unspecified: Secondary | ICD-10-CM | POA: Insufficient documentation

## 2012-01-16 DIAGNOSIS — Z23 Encounter for immunization: Secondary | ICD-10-CM

## 2012-01-16 DIAGNOSIS — H60399 Other infective otitis externa, unspecified ear: Secondary | ICD-10-CM

## 2012-01-16 LAB — POCT URINALYSIS DIPSTICK
Bilirubin, UA: NEGATIVE
Glucose, UA: NEGATIVE
Ketones, UA: NEGATIVE
Protein, UA: NEGATIVE
Spec Grav, UA: 1.015

## 2012-01-16 LAB — POCT UA - MICROSCOPIC ONLY

## 2012-01-16 MED ORDER — CIPROFLOXACIN-HYDROCORTISONE 0.2-1 % OT SUSP: 3.0000 [drp] | Freq: Two times a day (BID) | OTIC | Status: AC

## 2012-01-16 NOTE — Patient Instructions (Addendum)
Ear drops for right external ear infection. Call if not improving. Do not use q tips. We will call with lab results. Start to decrease fatty foods, decrease stress, increase slowly fiber in diet and water.   Stop at front desk to set up referral.   Follow up in 3-4 weeks  30 min OV to discuss mood and possible IBS.

## 2012-01-16 NOTE — Assessment & Plan Note (Addendum)
UA contaminated.. But send for culture to determine if infection.. Doubt given no specific urinary symtpoms. 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  Will eval cbc and TSH, CMET nml. Start increasing fiber and water.  If not improving consider further eval with imaging vs referral to GI.  May need to eval HIDA scan given some element of RUQ pain as well. nml RUQ [place] in pastper pt.

## 2012-01-16 NOTE — Assessment & Plan Note (Signed)
Treat with antibiotic drops.

## 2012-01-16 NOTE — Progress Notes (Signed)
Subjective:    Patient ID: Kelsey Carlson, female    DOB: 1965-09-12, 46 y.o.   MRN: [id number]  HPI  46 year old female presents for annual evaluation of chronic medical problems.  She had her CPX, pelvic and breast exam done at [facility] in 09/2011.  She wants to mention the following:  Abdominal pain B, low: ongoing for past few years, worse in last few months After meals, relief with BM, gurgling a lot. Lasts 1 hour at a time. Alternate constipation and diarrhea off and on in last few weeks. No blood in stool.  On no meds. Tried align in past. No diet change, does not eat greasy foods. Some increase in stress lately.   Family Hx: no bowel issues.  Has been having some increase in stress. Has felt depressed x 1 year.One panic attack few months ago. Good sleep at night.  Taking Yoga.  No SI, no HI. Mother with history of depression.  Seen at Urgent care for right shoulder pain, intermittant. Better with ibuprofen, has not taken the vicodin, muscle relaxant.  Right ear pain and itchy x 24 hours.  Recently tubing on [facility].  Reviewed labs in detail.  Cholesterol at goal. Lab Results  Component Value Date   CHOL 200 01/09/2012   HDL 54.40 01/09/2012   LDLCALC 121* 01/09/2012   TRIG 122.0 01/09/2012   CHOLHDL 4 01/09/2012    Review of Systems  Constitutional: Negative for fever, fatigue and unexpected weight change.  HENT: Negative for ear pain, congestion, sore throat, sneezing, trouble swallowing and sinus pressure.   Eyes: Negative for pain and itching.  Respiratory: Negative for cough, shortness of breath and wheezing.   Cardiovascular: Negative for chest pain, palpitations and leg swelling.  Gastrointestinal: Positive for abdominal pain, diarrhea and constipation. Negative for nausea and blood in stool.  Genitourinary: Negative for dysuria, hematuria, vaginal discharge, difficulty urinating and menstrual problem.  Skin: Negative for rash.  Neurological: Negative for  syncope, weakness, light-headedness, numbness and headaches.  Psychiatric/Behavioral: Positive for dysphoric mood. Negative for confusion. The patient is not nervous/anxious.        Objective:   Physical Exam  Constitutional: Vital signs are normal. She appears well-developed and well-nourished. She is cooperative.  Non-toxic appearance. She does not appear ill. No distress.  HENT:  Head: Normocephalic.  Right Ear: Hearing, tympanic membrane, external ear and ear canal normal. Tympanic membrane is not erythematous, not retracted and not bulging.  Left Ear: Hearing, tympanic membrane, external ear and ear canal normal. Tympanic membrane is not erythematous, not retracted and not bulging.  Nose: No mucosal edema or rhinorrhea. Right sinus exhibits no maxillary sinus tenderness and no frontal sinus tenderness. Left sinus exhibits no maxillary sinus tenderness and no frontal sinus tenderness.  Mouth/Throat: Uvula is midline, oropharynx is clear and moist and mucous membranes are normal.  Eyes: Conjunctivae, EOM and lids are normal. Pupils are equal, round, and reactive to light. No foreign bodies found.  Neck: Trachea normal and normal range of motion. Neck supple. Carotid bruit is not present. No mass and no thyromegaly present.  Cardiovascular: Normal rate, regular rhythm, S1 normal, S2 normal, normal heart sounds, intact distal pulses and normal pulses.  Exam reveals no gallop and no friction rub.   No murmur heard. Pulmonary/Chest: Effort normal and breath sounds normal. Not tachypneic. No respiratory distress. She has no decreased breath sounds. She has no wheezes. She has no rhonchi. She has no rales.  Abdominal: Soft. Normal appearance and  bowel sounds are normal. There is no hepatosplenomegaly. There is tenderness in the right upper quadrant, right lower quadrant and left lower quadrant. There is positive Murphy's sign. There is no CVA tenderness. No hernia. Hernia confirmed negative in the  ventral area.  Neurological: She is alert.  Skin: Skin is warm, dry and intact. No rash noted.  Psychiatric: Her speech is normal and behavior is normal. Judgment and thought content normal. Her mood appears not anxious. Cognition and memory are normal. She does not exhibit a depressed mood.          Assessment & Plan:  The patient's preventative maintenance and recommended screening tests for an annual wellness exam were reviewed in full today. Brought up to date unless services declined.  Counselled on the importance of diet, exercise, and its role in overall health and mortality. The patient's FH and SH was reviewed, including their home life, tobacco status, and drug and alcohol status.   Pap/DVE, breast: nml 5/2013at [facility]YN Mammo: nml 09/2011 Vaccines: td due today, will do TDap

## 2012-01-16 NOTE — Assessment & Plan Note (Signed)
Mild. Discussed medication vs. Counseling. Will start with counseling . Follow up in 3-4 weeks.

## 2012-01-17 LAB — CBC WITH DIFFERENTIAL/PLATELET
Basophils Relative: 1 % (ref 0.0–3.0)
Eosinophils Absolute: 0.2 10*3/uL (ref 0.0–0.7)
Eosinophils Relative: 1.9 % (ref 0.0–5.0)
Hemoglobin: 14.6 g/dL (ref 12.0–15.0)
Lymphocytes Relative: 23.8 % (ref 12.0–46.0)
MCHC: 33.3 g/dL (ref 30.0–36.0)
Monocytes Relative: 9.5 % (ref 3.0–12.0)
Neutro Abs: 6.2 10*3/uL (ref 1.4–7.7)
Neutrophils Relative %: 63.8 % (ref 43.0–77.0)
RBC: 4.63 Mil/uL (ref 3.87–5.11)
WBC: 9.8 10*3/uL (ref 4.5–10.5)

## 2012-01-19 LAB — URINE CULTURE: Colony Count: >=100000

## 2012-01-29 ENCOUNTER — Ambulatory Visit (INDEPENDENT_AMBULATORY_CARE_PROVIDER_SITE_OTHER): Payer: 59 | Admitting: Psychology

## 2012-01-29 DIAGNOSIS — F331 Major depressive disorder, recurrent, moderate: Secondary | ICD-10-CM

## 2012-02-06 ENCOUNTER — Ambulatory Visit (INDEPENDENT_AMBULATORY_CARE_PROVIDER_SITE_OTHER): Payer: 59 | Admitting: Psychology

## 2012-02-06 DIAGNOSIS — F4323 Adjustment disorder with mixed anxiety and depressed mood: Secondary | ICD-10-CM

## 2012-02-13 ENCOUNTER — Ambulatory Visit: Payer: 59 | Admitting: Family Medicine

## 2012-02-18 ENCOUNTER — Ambulatory Visit (INDEPENDENT_AMBULATORY_CARE_PROVIDER_SITE_OTHER): Payer: 59 | Admitting: Family Medicine

## 2012-02-18 ENCOUNTER — Encounter: Payer: Self-pay | Admitting: Family Medicine

## 2012-02-18 VITALS — BP 110/72 | HR 70 | Temp 98.3°F | Resp 20 | Ht 64.0 in | Wt 191.5 lb

## 2012-02-18 DIAGNOSIS — F329 Major depressive disorder, single episode, unspecified: Secondary | ICD-10-CM

## 2012-02-18 DIAGNOSIS — G4700 Insomnia, unspecified: Secondary | ICD-10-CM | POA: Insufficient documentation

## 2012-02-18 MED ORDER — TRAZODONE HCL 50 MG PO TABS: 25.0000 mg | ORAL_TABLET | Freq: Every evening | ORAL | Status: DC | PRN

## 2012-02-18 NOTE — Assessment & Plan Note (Signed)
Early morning waking may be due to depression. Discussed healthy sleep hygeine. Trial of trazodone prn.

## 2012-02-18 NOTE — Assessment & Plan Note (Signed)
Improved with counseling. No medication needed.

## 2012-02-18 NOTE — Patient Instructions (Addendum)
Trial of trazodone for sleep. Also increase exercise.  Do not skip meals.  Work on lower carb diet: try [employer] or [employer].

## 2012-02-18 NOTE — Progress Notes (Signed)
  Subjective:    Patient ID: Kelsey Carlson, female    DOB: 30-Jun-1965, 46 y.o.   MRN: [id number]  HPI 46 year old female presents for 1 month follow up mood.  She had been trying counseling.Marland Kitchen Has seen counselor twice and has pastor support.  No SI.  Stress is improved. Does not need a medication at this time. She is able to fall asleep but wakes up at 2-3 AM. Mind is not racing. No nocturia. Nothing else  Waking her up.  Has not tried any medication in past.  She is concerned today because of weight gain and difficulty sleeping.  Nutrition not covered by insurance.  Working on walking 3 tinmes a week. Has "get moving " program at work. Often skips meals, snacks a lot. Review of Systems  Constitutional: Negative for fever and fatigue.  HENT: Negative for ear pain.   Eyes: Negative for pain.  Respiratory: Negative for chest tightness and shortness of breath.   Cardiovascular: Negative for chest pain, palpitations and leg swelling.  Gastrointestinal: Negative for abdominal pain.  Genitourinary: Negative for dysuria.       Objective:   Physical Exam  Constitutional: Vital signs are normal. She appears well-developed and well-nourished. She is cooperative.  Non-toxic appearance. She does not appear ill. No distress.  HENT:  Head: Normocephalic.  Right Ear: Hearing, tympanic membrane, external ear and ear canal normal. Tympanic membrane is not erythematous, not retracted and not bulging.  Left Ear: Hearing, tympanic membrane, external ear and ear canal normal. Tympanic membrane is not erythematous, not retracted and not bulging.  Nose: No mucosal edema or rhinorrhea. Right sinus exhibits no maxillary sinus tenderness and no frontal sinus tenderness. Left sinus exhibits no maxillary sinus tenderness and no frontal sinus tenderness.  Mouth/Throat: Uvula is midline, oropharynx is clear and moist and mucous membranes are normal.  Eyes: Conjunctivae normal, EOM and lids are normal. Pupils  are equal, round, and reactive to light. No foreign bodies found.  Neck: Trachea normal and normal range of motion. Neck supple. Carotid bruit is not present. No mass and no thyromegaly present.  Cardiovascular: Normal rate, regular rhythm, S1 normal, S2 normal, normal heart sounds, intact distal pulses and normal pulses.  Exam reveals no gallop and no friction rub.   No murmur heard. Pulmonary/Chest: Effort normal and breath sounds normal. Not tachypneic. No respiratory distress. She has no decreased breath sounds. She has no wheezes. She has no rhonchi. She has no rales.  Abdominal: Soft. Normal appearance and bowel sounds are normal. There is no tenderness.  Neurological: She is alert.  Skin: Skin is warm, dry and intact. No rash noted.  Psychiatric: Her speech is normal and behavior is normal. Judgment and thought content normal. Her mood appears not anxious. Cognition and memory are normal. She does not exhibit a depressed mood.          Assessment & Plan:   Spent 10 min counseling on weight loss and healthy eating. Recommended exercise increase.

## 2012-02-20 ENCOUNTER — Ambulatory Visit (INDEPENDENT_AMBULATORY_CARE_PROVIDER_SITE_OTHER): Payer: 59 | Admitting: Psychology

## 2012-02-20 DIAGNOSIS — F4323 Adjustment disorder with mixed anxiety and depressed mood: Secondary | ICD-10-CM

## 2012-03-26 ENCOUNTER — Ambulatory Visit: Payer: Self-pay | Admitting: Psychology

## 2012-04-04 ENCOUNTER — Ambulatory Visit: Payer: Self-pay | Admitting: Psychology

## 2012-04-04 ENCOUNTER — Ambulatory Visit (INDEPENDENT_AMBULATORY_CARE_PROVIDER_SITE_OTHER): Payer: 59 | Admitting: Psychology

## 2012-04-04 DIAGNOSIS — F4323 Adjustment disorder with mixed anxiety and depressed mood: Secondary | ICD-10-CM

## 2012-04-11 ENCOUNTER — Ambulatory Visit: Payer: 59 | Admitting: Psychology

## 2012-05-20 HISTORY — PX: BREAST EXCISIONAL BIOPSY: SUR124

## 2012-06-08 ENCOUNTER — Encounter: Payer: Self-pay | Admitting: Family Medicine

## 2012-06-08 ENCOUNTER — Ambulatory Visit (INDEPENDENT_AMBULATORY_CARE_PROVIDER_SITE_OTHER): Payer: 59 | Admitting: Family Medicine

## 2012-06-08 VITALS — BP 120/64 | HR 73 | Temp 98.1°F | Ht 64.0 in | Wt 195.0 lb

## 2012-06-08 DIAGNOSIS — M549 Dorsalgia, unspecified: Secondary | ICD-10-CM

## 2012-06-08 MED ORDER — TRAMADOL HCL 50 MG PO TABS: 50.0000 mg | ORAL_TABLET | Freq: Four times a day (QID) | ORAL | Status: DC | PRN

## 2012-06-08 MED ORDER — DICLOFENAC SODIUM 75 MG PO TBEC: 75.0000 mg | DELAYED_RELEASE_TABLET | Freq: Two times a day (BID) | ORAL | Status: DC

## 2012-06-08 NOTE — Progress Notes (Signed)
    Patient Name: Kelsey Carlson Date of Birth: 13-Apr-1966 Medical Record Number: [id number] Gender: female  PCP: Kerby Nora, MD  History of Present Illness:  Kelsey Carlson is a 47 y.o. very pleasant female patient who presents with the following: Back Pain  ongoing for approximately: 4-5 days The patient has had back pain before. The back pain is localized into the lumbar spine area on the L. They also describe occ L radiculopathy. Low back, now different and now on the left side. Feels swollen and hurting to touch. Usually 4-5 days and will get better. Took some pain meds.   Lumbar surgery, 1996. Partial discectomy.  No bowel or bladder incontinence. No focal weakness. Prior interventions: above, motrin, vicodin Physical therapy: No Chiropractic manipulations: No Acupuncture: No Osteopathic manipulation: No Heat or cold: Minimal effect  Past Medical History, Surgical History, Family History, Medications, Allergies have been reviewed and updated if relevant.  Review of Systems  GEN: No fevers, chills. Nontoxic. Primarily MSK c/o today. MSK: Detailed in the HPI GI: tolerating PO intake without difficulty Neuro: As above  Otherwise the pertinent positives of the ROS are noted above.    Physical Exam  Filed Vitals:   06/08/12 1500  BP: 120/64  Pulse: 73  Temp: 98.1 F (36.7 C)  TempSrc: Oral  Height: 5\' 4"  (1.626 m)  Weight: 195 lb (88.451 kg)  SpO2: 98%    Gen: Well-developed,well-nourished,in no acute distress; alert,appropriate and cooperative throughout examination HEENT: Normocephalic and atraumatic without obvious abnormalities.  Ears, externally no deformities Pulm: Breathing comfortably in no respiratory distress Range of motion at  the waist: Flexion, rotation and lateral bending: difficulty ext, normal flexion and lateral bending  No echymosis or edema Rises to examination table with no difficulty Gait: minimally antalgic  Inspection/Deformity:  No abnormality Paraspinus T:  Mild-mod around L5 on L  B Ankle Dorsiflexion (L5,4): 5/5 B Great Toe Dorsiflexion (L5,4): 5/5 Heel Walk (L5): WNL Toe Walk (S1): WNL Rise/Squat (L4): WNL, mild pain  SENSORY B Medial Foot (L4): WNL B Dorsum (L5): WNL B Lateral (S1): WNL Light Touch: WNL Pinprick: WNL  REFLEXES Knee (L4): 3+ b Ankle (S1): 2+  B SLR, seated: neg B SLR, supine: neg B FABER: neg B Reverse FABER: neg B Greater Troch: NT B Log Roll: neg B Stork: NT B Sciatic Notch: NT   Assessment and Plan:  Back pain  I reviewed with the patient the structures involved and how they related to diagnosis.   Conservative algorithms for acute back pain generally begin with the following: NSAIDS Muscle Relaxants Mild pain medication Start with medications, core rehab, and progress from there following low back pain algorithm. No red flags are present. [facility] back program  Meds ordered this encounter  Medications  . diclofenac (VOLTAREN) 75 MG EC tablet    Sig: Take 1 tablet (75 mg total) by mouth 2 (two) times daily.    Dispense:  60 tablet    Refill:  3  . traMADol (ULTRAM) 50 MG tablet    Sig: Take 1 tablet (50 mg total) by mouth every 6 (six) hours as needed for pain.    Dispense:  50 tablet    Refill:  2

## 2012-06-10 ENCOUNTER — Other Ambulatory Visit: Payer: Self-pay | Admitting: Obstetrics and Gynecology

## 2012-06-10 MED ORDER — ESTRADIOL 0.025 MG/24HR TD PTWK: 1.0000 | MEDICATED_PATCH | TRANSDERMAL | Status: DC

## 2012-09-11 ENCOUNTER — Other Ambulatory Visit: Payer: Self-pay

## 2012-09-11 DIAGNOSIS — Z1231 Encounter for screening mammogram for malignant neoplasm of breast: Secondary | ICD-10-CM

## 2012-09-28 ENCOUNTER — Telehealth: Payer: Self-pay

## 2012-09-28 NOTE — Telephone Encounter (Signed)
Pt request dates of OV for back pain; advised 06/08/12. Pt voiced understanding.

## 2012-09-29 ENCOUNTER — Ambulatory Visit
Admission: RE | Admit: 2012-09-29 | Discharge: 2012-09-29 | Disposition: A | Discharge status: Home / Self Care | Payer: 59 | Source: Ambulatory Visit

## 2012-09-29 DIAGNOSIS — Z1231 Encounter for screening mammogram for malignant neoplasm of breast: Secondary | ICD-10-CM

## 2013-01-22 ENCOUNTER — Encounter (INDEPENDENT_AMBULATORY_CARE_PROVIDER_SITE_OTHER): Payer: Self-pay | Admitting: Surgery

## 2013-01-22 ENCOUNTER — Ambulatory Visit (INDEPENDENT_AMBULATORY_CARE_PROVIDER_SITE_OTHER): Payer: 59 | Admitting: Surgery

## 2013-01-22 VITALS — BP 114/66 | HR 80 | Temp 98.4°F | Resp 14 | Ht 64.0 in | Wt 188.2 lb

## 2013-01-22 DIAGNOSIS — N6320 Unspecified lump in the left breast, unspecified quadrant: Secondary | ICD-10-CM

## 2013-01-22 DIAGNOSIS — N63 Unspecified lump in unspecified breast: Secondary | ICD-10-CM

## 2013-01-22 NOTE — Progress Notes (Signed)
Patient ID: Kelsey Carlson, female   DOB: 1965-05-28, 47 y.o.   MRN: [id number]  Chief Complaint  Patient presents with  . New Evaluation    eval LFT breast mass    HPI Kelsey Carlson is a 47 y.o. female.  Patient sent at request of Dr.Rivard for left breast mass. It has been present for 2 years and is getting larger. Mammogram in May of this year was normal. Mass is located in the lateral left breast. It is not tender. It is increasing in size slowly. No evidence of nipple drainage or other mass lesion. Family history of breast cancer. Previous breast biopsies in the past which were all benign. HPI  Past Medical History  Diagnosis Date  . Nephrolithiasis   . Urolithiasis     Past Surgical History  Procedure Laterality Date  . Lumbar disc surgery    . Breast biopsy    . Bladder suspension    . Tubal ligation      Essure  . Breast surgery      Family History  Problem Relation Age of Onset  . Cancer Mother 26    breast  . Hypertension Father     Social History History  Substance Use Topics  . Smoking status: Former Smoker    Quit date: 05/20/1994  . Smokeless tobacco: Never Used  . Alcohol Use: Yes     Comment: socially    No Known Allergies  Current Outpatient Prescriptions  Medication Sig Dispense Refill  . acetaminophen (TYLENOL) 325 MG tablet Take 650 mg by mouth every 6 (six) hours as needed for pain (prn).      Marland Kitchen estradiol (CLIMARA) 0.025 mg/24hr Place 1 patch (0.025 mg total) onto the skin every 30 (thirty) days.  4 patch  1  . ibuprofen (ADVIL,MOTRIN) 200 MG tablet Take 200 mg by mouth every 6 (six) hours as needed for pain (prn).       No current facility-administered medications for this visit.    Review of Systems Review of Systems  Constitutional: Negative for fever, chills and unexpected weight change.  HENT: Negative for hearing loss, congestion, sore throat, trouble swallowing and voice change.   Eyes: Negative for visual disturbance.    Respiratory: Negative for cough and wheezing.   Cardiovascular: Negative for chest pain, palpitations and leg swelling.  Gastrointestinal: Negative for nausea, vomiting, abdominal pain, diarrhea, constipation, blood in stool, abdominal distention and anal bleeding.  Genitourinary: Negative for hematuria, vaginal bleeding and difficulty urinating.  Musculoskeletal: Negative for arthralgias.  Skin: Negative for rash and wound.  Neurological: Negative for seizures, syncope and headaches.  Hematological: Negative for adenopathy. Does not bruise/bleed easily.  Psychiatric/Behavioral: Negative for confusion.    Blood pressure 114/66, pulse 80, temperature 98.4 F (36.9 C), temperature source Temporal, resp. rate 14, height 5\' 4"  (1.626 m), weight 188 lb 3.2 oz (85.367 kg).  Physical Exam Physical Exam  Constitutional: She is oriented to person, place, and time. She appears well-developed and well-nourished.  HENT:  Head: Normocephalic and atraumatic.  Eyes: EOM are normal. Pupils are equal, round, and reactive to light.  Neck: Normal range of motion. Neck supple.  Pulmonary/Chest: Right breast exhibits no inverted nipple, no mass, no nipple discharge, no skin change and no tenderness. Left breast exhibits mass. Left breast exhibits no inverted nipple, no nipple discharge, no skin change and no tenderness. Breasts are symmetrical.    Musculoskeletal: Normal range of motion.  Neurological: She is alert and oriented to person, place, and  time.  Skin: Skin is warm and dry.  Psychiatric: She has a normal mood and affect. Her behavior is normal. Judgment and thought content normal.    Data Reviewed Clinical Data: Screening.  DIGITAL BILATERAL SCREENING MAMMOGRAM WITH CAD  DIGITAL BREAST TOMOSYNTHESIS  Digital breast tomosynthesis images are acquired in two  projections. These images are reviewed in combination with the  digital mammogram, confirming the findings below.  Comparison:  03/04/2006  FINDINGS:  ACR Breast Density Category 2: There are scattered fibroglandular  densities.  There is no suspicious dominant mass, architectural distortion, or  calcification to suggest malignancy. A fluctuating oval  circumscribed mass in the left upper outer quadrant posteriorly  that has been shown to represent a cyst on prior studies is again  noted.  Images were processed with CAD.  IMPRESSION:  No mammographic evidence of malignancy.  A result letter of this screening mammogram will be mailed directly  to the patient.  RECOMMENDATION:  Screening mammogram in one year. (Code:SM-B-01Y)  BI-RADS CATEGORY 1: Negative.  Original Report Authenticated By: Cain Saupe, M.D.    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 I also discussed observation with close followup as well. She wants  proceed with left breast excision biopsy of mass in tail of left breast.The procedure has been discussed with the patient. Alternatives to surgery have been discussed with the patient.  Risks of surgery include bleeding,  Infection,  Seroma formation, death,  and the need for further surgery.   The patient understands and wishes to proceed.       Mirta Mally A. 01/22/2013, 12:29 PM

## 2013-01-22 NOTE — Patient Instructions (Signed)
Lumpectomy, Breast Conserving Surgery A lumpectomy is breast surgery that removes only part of the breast. Another name used may be partial mastectomy. The amount removed varies. Make sure you understand how much of your breast will be removed. Reasons for a lumpectomy:  Any solid breast mass.  Grouped significant nodularity that may be confused with a solitary breast mass. Lumpectomy is the most common form of breast cancer surgery today. The surgeon removes the portion of your breast which contains the tumor (cancer). This is the lump. Some normal tissue around the lump is also removed to be sure that all the tumor has been removed.  If cancer cells are found in the margins where the breast tissue was removed, your surgeon will do more surgery to remove the remaining cancer tissue. This is called re-excision surgery. Radiation and/or chemotherapy treatments are often given following a lumpectomy to kill any cancer cells that could possibly remain.  REASONS YOU MAY NOT BE ABLE TO HAVE BREAST CONSERVING SURGERY:  The tumor is located in more than one place.  Your breast is small and the tumor is large so the breast would be disfigured.  The entire tumor removal is not successful with a lumpectomy.  You cannot commit to a full course of chemotherapy, radiation therapy or are pregnant and cannot have radiation.  You have previously had radiation to the breast to treat cancer. HOW A LUMPECTOMY IS PERFORMED If overnight nursing is not required following a biopsy, a lumpectomy can be performed as a same-day surgery. This can be done in a hospital, clinic, or surgical center. The anesthesia used will depend on your surgeon. They will discuss this with you. A general anesthetic keeps you sleeping through the procedure. LET YOUR CAREGIVERS KNOW ABOUT THE FOLLOWING:  Allergies  Medications taken including herbs, eye drops, over the counter medications, and creams.  Use of steroids (by mouth or  creams)  Previous problems with anesthetics or Novocaine.  Possibility of pregnancy, if this applies  History of blood clots (thrombophlebitis)  History of bleeding or blood problems.  Previous surgery  Other health problems BEFORE THE PROCEDURE You should be present one hour prior to your procedure unless directed otherwise.  AFTER THE PROCEDURE  After surgery, you will be taken to the recovery area where a nurse will watch and check your progress. Once you're awake, stable, and taking fluids well, barring other problems you will be allowed to go home.  Ice packs applied to your operative site may help with discomfort and keep the swelling down.  A small rubber drain may be placed in the breast for a couple of days to prevent a hematoma from developing in the breast.  A pressure dressing may be applied for 24 to 48 hours to prevent bleeding.  Keep the wound dry.  You may resume a normal diet and activities as directed. Avoid strenuous activities affecting the arm on the side of the biopsy site such as tennis, swimming, heavy lifting (more than 10 pounds) or pulling.  Bruising in the breast is normal following this procedure.  Wearing a bra - even to bed - may be more comfortable and also help keep the dressing on.  Change dressings as directed.  Only take over-the-counter or prescription medicines for pain, discomfort, or fever as directed by your caregiver. Call for your results as instructed by your surgeon. Remember it is your responsibility to get the results of your lumpectomy if your surgeon asked you to follow-up. Do not assume   everything is fine if you have not heard from your caregiver. SEEK MEDICAL CARE IF:   There is increased bleeding (more than a small spot) from the wound.  You notice redness, swelling, or increasing pain in the wound.  Pus is coming from wound.  An unexplained oral temperature above 102 F (38.9 C) develops.  You notice a foul smell  coming from the wound or dressing. SEEK IMMEDIATE MEDICAL CARE IF:   You develop a rash.  You have difficulty breathing.  You have any allergic problems. Document Released: 06/17/2006 Document Revised: 07/29/2011 Document Reviewed: 09/18/2006 [employer] Patient Information 2014 [employer].  

## 2013-02-12 ENCOUNTER — Encounter (HOSPITAL_BASED_OUTPATIENT_CLINIC_OR_DEPARTMENT_OTHER): Payer: Self-pay | Admitting: *Deleted

## 2013-02-12 NOTE — Progress Notes (Signed)
No labs needed

## 2013-02-19 ENCOUNTER — Encounter (HOSPITAL_BASED_OUTPATIENT_CLINIC_OR_DEPARTMENT_OTHER)
Admission: RE | Disposition: A | Discharge status: Home / Self Care | Payer: Self-pay | Source: Ambulatory Visit | Attending: Surgery

## 2013-02-19 ENCOUNTER — Ambulatory Visit (HOSPITAL_BASED_OUTPATIENT_CLINIC_OR_DEPARTMENT_OTHER): Payer: 59 | Admitting: Anesthesiology

## 2013-02-19 ENCOUNTER — Ambulatory Visit (HOSPITAL_BASED_OUTPATIENT_CLINIC_OR_DEPARTMENT_OTHER)
Admission: RE | Admit: 2013-02-19 | Discharge: 2013-02-19 | Disposition: A | Discharge status: Home / Self Care | Payer: 59 | Source: Ambulatory Visit | Attending: Surgery | Admitting: Surgery

## 2013-02-19 ENCOUNTER — Encounter (HOSPITAL_BASED_OUTPATIENT_CLINIC_OR_DEPARTMENT_OTHER): Payer: Self-pay | Admitting: Anesthesiology

## 2013-02-19 ENCOUNTER — Encounter (HOSPITAL_BASED_OUTPATIENT_CLINIC_OR_DEPARTMENT_OTHER): Payer: Self-pay | Admitting: *Deleted

## 2013-02-19 DIAGNOSIS — M542 Cervicalgia: Secondary | ICD-10-CM

## 2013-02-19 DIAGNOSIS — S139XXS Sprain of joints and ligaments of unspecified parts of neck, sequela: Secondary | ICD-10-CM

## 2013-02-19 DIAGNOSIS — D249 Benign neoplasm of unspecified breast: Secondary | ICD-10-CM

## 2013-02-19 DIAGNOSIS — N209 Urinary calculus, unspecified: Secondary | ICD-10-CM

## 2013-02-19 DIAGNOSIS — G4700 Insomnia, unspecified: Secondary | ICD-10-CM

## 2013-02-19 DIAGNOSIS — Z87891 Personal history of nicotine dependence: Secondary | ICD-10-CM | POA: Insufficient documentation

## 2013-02-19 DIAGNOSIS — F329 Major depressive disorder, single episode, unspecified: Secondary | ICD-10-CM

## 2013-02-19 DIAGNOSIS — N6320 Unspecified lump in the left breast, unspecified quadrant: Secondary | ICD-10-CM

## 2013-02-19 DIAGNOSIS — Z87442 Personal history of urinary calculi: Secondary | ICD-10-CM

## 2013-02-19 DIAGNOSIS — G4730 Sleep apnea, unspecified: Secondary | ICD-10-CM

## 2013-02-19 HISTORY — PX: BREAST CYST EXCISION: SHX579

## 2013-02-19 HISTORY — DX: Presence of spectacles and contact lenses: Z97.3

## 2013-02-19 LAB — POCT HEMOGLOBIN-HEMACUE: Hemoglobin: 14.8 g/dL (ref 12.0–15.0)

## 2013-02-19 SURGERY — EXCISION, CYST, BREAST
Anesthesia: General | Site: Breast | Laterality: Left | Wound class: Clean

## 2013-02-19 MED ORDER — KETOROLAC TROMETHAMINE 30 MG/ML IJ SOLN
INTRAMUSCULAR | Status: DC | PRN
  Administered 2013-02-19: 30 mg via INTRAVENOUS

## 2013-02-19 MED ORDER — OXYCODONE HCL 5 MG PO TABS
5.0000 mg | ORAL_TABLET | Freq: Once | ORAL | Status: AC | PRN
  Administered 2013-02-19: 5 mg via ORAL

## 2013-02-19 MED ORDER — DEXTROSE 5 % IV SOLN
3.0000 g | INTRAVENOUS | Status: AC
  Administered 2013-02-19: 2 g via INTRAVENOUS

## 2013-02-19 MED ORDER — CHLORHEXIDINE GLUCONATE 4 % EX LIQD: 1.0000 "application " | Freq: Once | CUTANEOUS | Status: DC

## 2013-02-19 MED ORDER — MIDAZOLAM HCL 5 MG/5ML IJ SOLN
INTRAMUSCULAR | Status: DC | PRN
  Administered 2013-02-19: 2 mg via INTRAVENOUS

## 2013-02-19 MED ORDER — OXYCODONE-ACETAMINOPHEN 5-325 MG PO TABS: 1.0000 | ORAL_TABLET | ORAL | Status: DC | PRN

## 2013-02-19 MED ORDER — DEXAMETHASONE SODIUM PHOSPHATE 4 MG/ML IJ SOLN
INTRAMUSCULAR | Status: DC | PRN
  Administered 2013-02-19: 10 mg via INTRAVENOUS

## 2013-02-19 MED ORDER — OXYCODONE HCL 5 MG/5ML PO SOLN: 5.0000 mg | Freq: Once | ORAL | Status: AC | PRN

## 2013-02-19 MED ORDER — ONDANSETRON HCL 4 MG/2ML IJ SOLN
INTRAMUSCULAR | Status: DC | PRN
  Administered 2013-02-19: 4 mg via INTRAVENOUS

## 2013-02-19 MED ORDER — BUPIVACAINE-EPINEPHRINE 0.25% -1:200000 IJ SOLN
INTRAMUSCULAR | Status: DC | PRN
  Administered 2013-02-19: 20 mL

## 2013-02-19 MED ORDER — PROPOFOL 10 MG/ML IV BOLUS
INTRAVENOUS | Status: DC | PRN
  Administered 2013-02-19: 200 mg via INTRAVENOUS

## 2013-02-19 MED ORDER — LACTATED RINGERS IV SOLN
INTRAVENOUS | Status: DC
  Administered 2013-02-19: 20 mL/h via INTRAVENOUS
  Administered 2013-02-19: 14:00:00 via INTRAVENOUS

## 2013-02-19 MED ORDER — HYDROMORPHONE HCL PF 1 MG/ML IJ SOLN
0.2500 mg | INTRAMUSCULAR | Status: DC | PRN
  Administered 2013-02-19 (×2): 0.5 mg via INTRAVENOUS

## 2013-02-19 MED ORDER — LIDOCAINE HCL (CARDIAC) 20 MG/ML IV SOLN
INTRAVENOUS | Status: DC | PRN
  Administered 2013-02-19: 80 mg via INTRAVENOUS

## 2013-02-19 MED ORDER — PROMETHAZINE HCL 25 MG/ML IJ SOLN: 6.2500 mg | INTRAMUSCULAR | Status: DC | PRN

## 2013-02-19 MED ORDER — FENTANYL CITRATE 0.05 MG/ML IJ SOLN
INTRAMUSCULAR | Status: DC | PRN
  Administered 2013-02-19 (×2): 50 ug via INTRAVENOUS

## 2013-02-19 SURGICAL SUPPLY — 47 items
ADH SKN CLS APL DERMABOND .7 (GAUZE/BANDAGES/DRESSINGS) ×1
BINDER BREAST LRG (GAUZE/BANDAGES/DRESSINGS) IMPLANT
BINDER BREAST MEDIUM (GAUZE/BANDAGES/DRESSINGS) IMPLANT
BINDER BREAST XLRG (GAUZE/BANDAGES/DRESSINGS) IMPLANT
BINDER BREAST XXLRG (GAUZE/BANDAGES/DRESSINGS) IMPLANT
BLADE SURG 15 STRL LF DISP TIS (BLADE) ×1 IMPLANT
BLADE SURG 15 STRL SS (BLADE) ×2
CANISTER SUCTION 1200CC (MISCELLANEOUS) ×2 IMPLANT
CHLORAPREP W/TINT 26ML (MISCELLANEOUS) ×2 IMPLANT
CLIP TI WIDE RED SMALL 6 (CLIP) IMPLANT
CLOTH BEACON ORANGE TIMEOUT ST (SAFETY) ×2 IMPLANT
COVER MAYO STAND STRL (DRAPES) ×2 IMPLANT
COVER TABLE BACK 60X90 (DRAPES) ×2 IMPLANT
DECANTER SPIKE VIAL GLASS SM (MISCELLANEOUS) IMPLANT
DERMABOND ADVANCED (GAUZE/BANDAGES/DRESSINGS) ×1
DERMABOND ADVANCED .7 DNX12 (GAUZE/BANDAGES/DRESSINGS) ×1 IMPLANT
DEVICE DUBIN W/COMP PLATE 8390 (MISCELLANEOUS) IMPLANT
DRAPE LAPAROSCOPIC ABDOMINAL (DRAPES) IMPLANT
DRAPE PED LAPAROTOMY (DRAPES) ×2 IMPLANT
DRAPE UTILITY XL STRL (DRAPES) ×2 IMPLANT
ELECT COATED BLADE 2.86 ST (ELECTRODE) ×2 IMPLANT
ELECT REM PT RETURN 9FT ADLT (ELECTROSURGICAL) ×2
ELECTRODE REM PT RTRN 9FT ADLT (ELECTROSURGICAL) ×1 IMPLANT
GLOVE BIOGEL PI IND STRL 7.0 (GLOVE) ×2 IMPLANT
GLOVE BIOGEL PI IND STRL 8 (GLOVE) ×1 IMPLANT
GLOVE BIOGEL PI INDICATOR 7.0 (GLOVE) ×2
GLOVE BIOGEL PI INDICATOR 8 (GLOVE) ×1
GLOVE ECLIPSE 6.5 STRL STRAW (GLOVE) ×4 IMPLANT
GLOVE ECLIPSE 8.0 STRL XLNG CF (GLOVE) ×2 IMPLANT
GOWN PREVENTION PLUS XLARGE (GOWN DISPOSABLE) ×6 IMPLANT
KIT MARKER MARGIN INK (KITS) IMPLANT
NEEDLE HYPO 25X1 1.5 SAFETY (NEEDLE) ×2 IMPLANT
NS IRRIG 1000ML POUR BTL (IV SOLUTION) ×2 IMPLANT
PACK BASIN DAY SURGERY FS (CUSTOM PROCEDURE TRAY) ×2 IMPLANT
PENCIL BUTTON HOLSTER BLD 10FT (ELECTRODE) ×2 IMPLANT
SLEEVE SCD COMPRESS KNEE MED (MISCELLANEOUS) ×2 IMPLANT
SPONGE LAP 4X18 X RAY DECT (DISPOSABLE) ×2 IMPLANT
STAPLER VISISTAT 35W (STAPLE) IMPLANT
SUT MON AB 4-0 PC3 18 (SUTURE) ×2 IMPLANT
SUT SILK 2 0 SH (SUTURE) IMPLANT
SUT VIC AB 3-0 SH 27 (SUTURE) ×4
SUT VIC AB 3-0 SH 27X BRD (SUTURE) ×2 IMPLANT
SYR CONTROL 10ML LL (SYRINGE) ×2 IMPLANT
TOWEL OR 17X24 6PK STRL BLUE (TOWEL DISPOSABLE) ×2 IMPLANT
TOWEL OR NON WOVEN STRL DISP B (DISPOSABLE) ×2 IMPLANT
TUBE CONNECTING 20X1/4 (TUBING) ×2 IMPLANT
YANKAUER SUCT BULB TIP NO VENT (SUCTIONS) ×2 IMPLANT

## 2013-02-19 NOTE — Interval H&P Note (Signed)
History and Physical Interval Note:  02/19/2013 2:14 PM  Keith Rake  has presented today for surgery, with the diagnosis of left breast mass  The various methods of treatment have been discussed with the patient and family. After consideration of risks, benefits and other options for treatment, the patient has consented to  Procedure(s):  EXCISION BREAST MASS (Left) as a surgical intervention .  The patient's history has been reviewed, patient examined, no change in status, stable for surgery.  I have reviewed the patient's chart and labs.  Questions were answered to the patient's satisfaction.     Esgar Barnick A.

## 2013-02-19 NOTE — Anesthesia Procedure Notes (Signed)
Procedure Name: LMA Insertion Date/Time: 02/19/2013 2:41 PM Performed by: Maris Berger T Pre-anesthesia Checklist: Patient identified, Emergency Drugs available, Suction available and Patient being monitored Patient Re-evaluated:Patient Re-evaluated prior to inductionOxygen Delivery Method: Circle System Utilized Preoxygenation: Pre-oxygenation with 100% oxygen Intubation Type: IV induction Ventilation: Mask ventilation without difficulty LMA: LMA inserted LMA Size: 4.0 Number of attempts: 1 Airway Equipment and Method: bite block Placement Confirmation: positive ETCO2 Dental Injury: Teeth and Oropharynx as per pre-operative assessment

## 2013-02-19 NOTE — H&P (View-Only) (Signed)
Patient ID: Kelsey Carlson, female   DOB: 12-13-1965, 47 y.o.   MRN: [id number]  Chief Complaint  Patient presents with  . New Evaluation    eval LFT breast mass    HPI Kelsey Carlson is a 47 y.o. female.  Patient sent at request of Dr.Rivard for left breast mass. It has been present for 2 years and is getting larger. Mammogram in May of this year was normal. Mass is located in the lateral left breast. It is not tender. It is increasing in size slowly. No evidence of nipple drainage or other mass lesion. Family history of breast cancer. Previous breast biopsies in the past which were all benign. HPI  Past Medical History  Diagnosis Date  . Nephrolithiasis   . Urolithiasis     Past Surgical History  Procedure Laterality Date  . Lumbar disc surgery    . Breast biopsy    . Bladder suspension    . Tubal ligation      Essure  . Breast surgery      Family History  Problem Relation Age of Onset  . Cancer Mother 30    breast  . Hypertension Father     Social History History  Substance Use Topics  . Smoking status: Former Smoker    Quit date: 05/20/1994  . Smokeless tobacco: Never Used  . Alcohol Use: Yes     Comment: socially    No Known Allergies  Current Outpatient Prescriptions  Medication Sig Dispense Refill  . acetaminophen (TYLENOL) 325 MG tablet Take 650 mg by mouth every 6 (six) hours as needed for pain (prn).      Marland Kitchen estradiol (CLIMARA) 0.025 mg/24hr Place 1 patch (0.025 mg total) onto the skin every 30 (thirty) days.  4 patch  1  . ibuprofen (ADVIL,MOTRIN) 200 MG tablet Take 200 mg by mouth every 6 (six) hours as needed for pain (prn).       No current facility-administered medications for this visit.    Review of Systems Review of Systems  Constitutional: Negative for fever, chills and unexpected weight change.  HENT: Negative for hearing loss, congestion, sore throat, trouble swallowing and voice change.   Eyes: Negative for visual disturbance.    Respiratory: Negative for cough and wheezing.   Cardiovascular: Negative for chest pain, palpitations and leg swelling.  Gastrointestinal: Negative for nausea, vomiting, abdominal pain, diarrhea, constipation, blood in stool, abdominal distention and anal bleeding.  Genitourinary: Negative for hematuria, vaginal bleeding and difficulty urinating.  Musculoskeletal: Negative for arthralgias.  Skin: Negative for rash and wound.  Neurological: Negative for seizures, syncope and headaches.  Hematological: Negative for adenopathy. Does not bruise/bleed easily.  Psychiatric/Behavioral: Negative for confusion.    Blood pressure 114/66, pulse 80, temperature 98.4 F (36.9 C), temperature source Temporal, resp. rate 14, height 5\' 4"  (1.626 m), weight 188 lb 3.2 oz (85.367 kg).  Physical Exam Physical Exam  Constitutional: She is oriented to person, place, and time. She appears well-developed and well-nourished.  HENT:  Head: Normocephalic and atraumatic.  Eyes: EOM are normal. Pupils are equal, round, and reactive to light.  Neck: Normal range of motion. Neck supple.  Pulmonary/Chest: Right breast exhibits no inverted nipple, no mass, no nipple discharge, no skin change and no tenderness. Left breast exhibits mass. Left breast exhibits no inverted nipple, no nipple discharge, no skin change and no tenderness. Breasts are symmetrical.    Musculoskeletal: Normal range of motion.  Neurological: She is alert and oriented to person, place, and  time.  Skin: Skin is warm and dry.  Psychiatric: She has a normal mood and affect. Her behavior is normal. Judgment and thought content normal.    Data Reviewed Clinical Data: Screening.  DIGITAL BILATERAL SCREENING MAMMOGRAM WITH CAD  DIGITAL BREAST TOMOSYNTHESIS  Digital breast tomosynthesis images are acquired in two  projections. These images are reviewed in combination with the  digital mammogram, confirming the findings below.  Comparison:  03/04/2006  FINDINGS:  ACR Breast Density Category 2: There are scattered fibroglandular  densities.  There is no suspicious dominant mass, architectural distortion, or  calcification to suggest malignancy. A fluctuating oval  circumscribed mass in the left upper outer quadrant posteriorly  that has been shown to represent a cyst on prior studies is again  noted.  Images were processed with CAD.  IMPRESSION:  No mammographic evidence of malignancy.  A result letter of this screening mammogram will be mailed directly  to the patient.  RECOMMENDATION:  Screening mammogram in one year. (Code:SM-B-01Y)  BI-RADS CATEGORY 1: Negative.  Original Report Authenticated By: Cain Saupe, M.D.    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 I also discussed observation with close followup as well. She wants  proceed with left breast excision biopsy of mass in tail of left breast.The procedure has been discussed with the patient. Alternatives to surgery have been discussed with the patient.  Risks of surgery include bleeding,  Infection,  Seroma formation, death,  and the need for further surgery.   The patient understands and wishes to proceed.       Morene Cecilio A. 01/22/2013, 12:29 PM

## 2013-02-19 NOTE — Transfer of Care (Signed)
Immediate Anesthesia Transfer of Care Note  Patient: Kelsey Carlson  Procedure(s) Performed: Procedure(s):  EXCISION BREAST MASS (Left)  Patient Location: PACU  Anesthesia Type:General  Level of Consciousness: awake, alert  and oriented  Airway & Oxygen Therapy: Patient Spontanous Breathing  Post-op Assessment: Report given to PACU RN and Post -op Vital signs reviewed and stable  Post vital signs: Reviewed and stable  Complications: No apparent anesthesia complications

## 2013-02-19 NOTE — Op Note (Signed)
Preoperative diagnosis: Left breast mass  Postoperative diagnosis: Same  Procedure: Left breast lumpectomy  Surgeon: Harriette Bouillon M.D.  Anesthesia: LMA with 0.25% Sensorcaine local with epinephrine 20 cc used  Specimen: Left breast tissue to pathology  Drains: None  IV fluids: 800 cc crystalloid  EBL: 20 cc  Indications for procedure: Patient presents to to left breast mass. Mammogram and ultrasound were normal. On examination there is a 3 cm mass in the left breast in the upper outer quadrant within the tail of Spence. The patient was concerned wished to have this removed despite negative workup.The procedure has been discussed with the patient. Alternatives to surgery have been discussed with the patient.  Risks of surgery include bleeding,  Infection,  Seroma formation, death,  and the need for further surgery.   The patient understands and wishes to proceed.  Description of procedure: Patient met in the holding area and questions are answered. The left breast mass was palpated and marked with the help of the patient. She's taken back to the operating room after questions were answered. She's placed supine where general anesthesia was initiated. Left breast is prepped and draped in a sterile fashion. Timeout was done and she received preoperative antibiotics. Curvilinear incision was made in the left rest upper quadrant over the mass. Dissection was carried down in a white fibrous mass identified and excised in its entirety. This was either a fibroadenoma or fibrocystic change look like grossly. The cavity is irrigated made hemostatic with cautery. It was closed deep layer of 3-0 Vicryl and 4-0 Monocryl subcuticular stitch. Dermabond applied. All final counts sponge, needle instrument and found to be correct this portion the case. Patient awoke extubated taken to recovery in satisfactory condition.

## 2013-02-19 NOTE — Anesthesia Preprocedure Evaluation (Signed)
Anesthesia Evaluation   Patient awake    Reviewed: Allergy & Precautions, H&P , NPO status   History of Anesthesia Complications Negative for: history of anesthetic complications  Airway   Neck ROM: Full    Dental  (+) Teeth Intact   Pulmonary sleep apnea ,  breath sounds clear to auscultation        Cardiovascular Rhythm:Regular Rate:Normal     Neuro/Psych Depression    GI/Hepatic negative GI ROS, Hx of ^ LFTs   Endo/Other  negative endocrine ROS  Renal/GU      Musculoskeletal negative musculoskeletal ROS (+)   Abdominal   Peds  Hematology   Anesthesia Other Findings   Reproductive/Obstetrics                           Anesthesia Physical Anesthesia Plan  ASA: II  Anesthesia Plan: General   Post-op Pain Management:    Induction: Intravenous  Airway Management Planned: LMA  Additional Equipment:   Intra-op Plan:   Post-operative Plan: Extubation in OR  Informed Consent: I have reviewed the patients History and Physical, chart, labs and discussed the procedure including the risks, benefits and alternatives for the proposed anesthesia with the patient or authorized representative who has indicated his/her understanding and acceptance.   Dental advisory given  Plan Discussed with: CRNA and Surgeon  Anesthesia Plan Comments:         Anesthesia Quick Evaluation

## 2013-02-19 NOTE — Anesthesia Postprocedure Evaluation (Signed)
  Anesthesia Post-op Note  Patient: Kelsey Carlson  Procedure(s) Performed: Procedure(s):  EXCISION BREAST MASS (Left)  Patient Location: PACU  Anesthesia Type:General  Level of Consciousness: awake  Airway and Oxygen Therapy: Patient Spontanous Breathing  Post-op Pain: mild  Post-op Assessment: Post-op Vital signs reviewed  Post-op Vital Signs: stable  Complications: No apparent anesthesia complications

## 2013-02-22 ENCOUNTER — Encounter (HOSPITAL_BASED_OUTPATIENT_CLINIC_OR_DEPARTMENT_OTHER): Payer: Self-pay | Admitting: Surgery

## 2013-02-23 ENCOUNTER — Telehealth (INDEPENDENT_AMBULATORY_CARE_PROVIDER_SITE_OTHER): Payer: Self-pay

## 2013-02-23 NOTE — Telephone Encounter (Signed)
Message copied by Brennan Bailey on Tue Feb 23, 2013  4:33 PM ------      Message from: Harriette Bouillon A      Created: Tue Feb 23, 2013  8:07 AM       benign ------

## 2013-02-23 NOTE — Telephone Encounter (Signed)
LMOM> path benign fibroadenoma

## 2013-02-24 NOTE — Telephone Encounter (Signed)
Patient called back and was given below message.  Patient states understanding. 

## 2013-03-08 ENCOUNTER — Encounter (INDEPENDENT_AMBULATORY_CARE_PROVIDER_SITE_OTHER): Payer: Self-pay | Admitting: Surgery

## 2013-03-08 ENCOUNTER — Encounter (INDEPENDENT_AMBULATORY_CARE_PROVIDER_SITE_OTHER): Payer: Self-pay

## 2013-03-08 ENCOUNTER — Ambulatory Visit (INDEPENDENT_AMBULATORY_CARE_PROVIDER_SITE_OTHER): Payer: 59 | Admitting: Surgery

## 2013-03-08 VITALS — BP 118/70 | HR 68 | Temp 98.2°F | Resp 14 | Ht 64.0 in | Wt 189.6 lb

## 2013-03-08 DIAGNOSIS — Z9889 Other specified postprocedural states: Secondary | ICD-10-CM

## 2013-03-08 NOTE — Patient Instructions (Signed)
Return as needed

## 2013-03-08 NOTE — Progress Notes (Signed)
Patient returns after left breast lumpectomy. 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  Return prn

## 2013-06-28 ENCOUNTER — Encounter: Payer: Self-pay | Admitting: Internal Medicine

## 2013-06-28 ENCOUNTER — Ambulatory Visit (INDEPENDENT_AMBULATORY_CARE_PROVIDER_SITE_OTHER): Payer: 59 | Admitting: Internal Medicine

## 2013-06-28 VITALS — BP 108/60 | HR 80 | Temp 98.1°F | Wt 192.0 lb

## 2013-06-28 DIAGNOSIS — M545 Low back pain, unspecified: Secondary | ICD-10-CM | POA: Insufficient documentation

## 2013-06-28 HISTORY — DX: Low back pain, unspecified: M54.50

## 2013-06-28 NOTE — Progress Notes (Signed)
   Subjective:    Patient ID: Kelsey Carlson, female    DOB: [birth year], 48 y.o.   MRN: [id number]  HPI Noticed some low back pain--with radiation to low abdomen--pelvis/ groin area Has regular back pain but this is different Awoke with the pain at 3:30AM-- or independently  Just got new bed-- this has helped the chronic pain Has pressure feeling both in back and groin  No trouble walking No weakness in legs No radiation into legs No recent injury or new activity  Hasn't used any meds It is a lot better now then this morning  Current Outpatient Prescriptions on File Prior to Visit  Medication Sig Dispense Refill  . acetaminophen (TYLENOL) 325 MG tablet Take 650 mg by mouth every 6 (six) hours as needed for pain (prn).      Marland Kitchen estradiol (CLIMARA) 0.025 mg/24hr Place 1 patch (0.025 mg total) onto the skin every 30 (thirty) days.  4 patch  1  . ibuprofen (ADVIL,MOTRIN) 200 MG tablet Take 200 mg by mouth every 6 (six) hours as needed for pain (prn).       No current facility-administered medications on file prior to visit.    No Known Allergies  Past Medical History  Diagnosis Date  . Nephrolithiasis   . Urolithiasis   . Wears glasses     Past Surgical History  Procedure Laterality Date  . Lumbar disc surgery  1997    lumb lam  . Breast biopsy  2002    lt br bx  . Bladder suspension  2008    sling-cysto  . Tubal ligation      Essure  . Breast surgery    . 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 Dilation and curettage of uterus    . Breast cyst excision Left 02/19/2013    Procedure:  EXCISION BREAST MASS;  Surgeon: Marcello Moores A. Cornett, MD;  Location: [facility];  Service: General;  Laterality: Left;    Family History  Problem Relation Age of Onset  . Cancer Mother 56    breast  . Hypertension Father     History   Social History  . Marital Status: Married    Spouse Name: N/A    Number of Children: N/A  . Years of Education: N/A    Occupational History  .  [employer] [place] History Main Topics  . Smoking status: Former Smoker    Quit date: 05/20/1994  . Smokeless tobacco: Never Used  . Alcohol Use: Yes     Comment: socially  . Drug Use: No  . Sexual Activity: Yes    Birth Control/ Protection: Surgical     Comment: Essure BTL   Other Topics Concern  . Not on file   Social History Narrative   Regular exercise- yes 3 x a week      Diet- fruits and veggies   Review of Systems No dysuria or urinary urgency No fever Troubles with IBS lately--gets severe pain and bloating and urgency. No blood. Pain is resolved after moving her bowels    Objective:   Physical Exam  Constitutional: She appears well-developed and well-nourished. No distress.  Musculoskeletal:  Tenderness laterally ~L2-3  No spine tenderness Normal back flexion  Neurological:  Normal gait Normal leg strength Reflexes are brisk but symmetric          Assessment & Plan:

## 2013-06-28 NOTE — Progress Notes (Signed)
Pre-visit discussion using our clinic review tool. No additional management support is needed unless otherwise documented below in the visit note.  

## 2013-06-28 NOTE — Assessment & Plan Note (Signed)
Seems to be muscular Reassured Discussed back extension exercises

## 2013-06-28 NOTE — Patient Instructions (Signed)
Back Exercises Back exercises help treat and prevent back injuries. The goal of back exercises is to increase the strength of your abdominal and back muscles and the flexibility of your back. These exercises should be started when you no longer have back pain. Back exercises include:  Pelvic Tilt. Lie on your back with your knees bent. Tilt your pelvis until the lower part of your back is against the floor. Hold this position 5 to 10 sec and repeat 5 to 10 times.  Knee to Chest. Pull first 1 knee up against your chest and hold for 20 to 30 seconds, repeat this with the other knee, and then both knees. This may be done with the other leg straight or bent, whichever feels better.  Sit-Ups or Curl-Ups. Bend your knees 90 degrees. Start with tilting your pelvis, and do a partial, slow sit-up, lifting your trunk only 30 to 45 degrees off the floor. Take at least 2 to 3 seconds for each sit-up. Do not do sit-ups with your knees out straight. If partial sit-ups are difficult, simply do the above but with only tightening your abdominal muscles and holding it as directed.  Hip-Lift. Lie on your back with your knees flexed 90 degrees. Push down with your feet and shoulders as you raise your hips a couple inches off the floor; hold for 10 seconds, repeat 5 to 10 times.  Back arches. Lie on your stomach, propping yourself up on bent elbows. Slowly press on your hands, causing an arch in your low back. Repeat 3 to 5 times. Any initial stiffness and discomfort should lessen with repetition over time.  Shoulder-Lifts. Lie face down with arms beside your body. Keep hips and torso pressed to floor as you slowly lift your head and shoulders off the floor. Do not overdo your exercises, especially in the beginning. Exercises may cause you some mild back discomfort which lasts for a few minutes; however, if the pain is more severe, or lasts for more than 15 minutes, do not continue exercises until you see your caregiver.  Improvement with exercise therapy for back problems is slow.  See your caregivers for assistance with developing a proper back exercise program. Document Released: 06/13/2004 Document Revised: 07/29/2011 Document Reviewed: 03/07/2011 [employer] Patient Information 2014 [employer].  

## 2013-07-13 ENCOUNTER — Encounter: Payer: Self-pay | Admitting: Family Medicine

## 2013-07-13 ENCOUNTER — Ambulatory Visit (INDEPENDENT_AMBULATORY_CARE_PROVIDER_SITE_OTHER): Payer: 59 | Admitting: Family Medicine

## 2013-07-13 VITALS — BP 112/84 | HR 76 | Temp 97.8°F | Wt 197.0 lb

## 2013-07-13 DIAGNOSIS — J209 Acute bronchitis, unspecified: Secondary | ICD-10-CM

## 2013-07-13 MED ORDER — GUAIFENESIN-CODEINE 100-10 MG/5ML PO SYRP: 5.0000 mL | ORAL_SOLUTION | Freq: Two times a day (BID) | ORAL | Status: DC | PRN

## 2013-07-13 MED ORDER — AZITHROMYCIN 250 MG PO TABS: ORAL_TABLET | ORAL | Status: DC

## 2013-07-13 NOTE — Assessment & Plan Note (Signed)
Given duration and progression of sxs, will treat with azithromycin with codeine cough syrup at night time. See pt instructions for further supportive care. Out of work note provided or today and tomorrow.

## 2013-07-13 NOTE — Progress Notes (Signed)
   BP 112/84  Pulse 76  Temp(Src) 97.8 F (36.6 C) (Oral)  Wt 197 lb (89.359 kg)  SpO2 97%  LMP 06/22/2013   CC: cough  Subjective:    Patient ID: Kelsey Carlson, female    DOB: August 01, 1965, 48 y.o.   MRN: [id number]  HPI: Kelsey Carlson is a 48 y.o. female presenting on 07/13/2013 with Cough   2 wk h/o cough and congestion.  Over last 2 days having purulent phlegm.  Some sweats at night.  Head and chest congestion.  + ST.  Post tussive emesis.  + pressure in top teeth.  + PNdrainage.  + dyspneic with walking up stairs.    No fevers/chills, ear pain.    Has tried OTC night time meds for cough with no improvement  No h/o PNA. Brother sick at home as well. Did not receive flu shot this year. No h/o asthma. exsmoker - quit 20 yrs ago.  Relevant past medical, surgical, family and social history reviewed and updated as indicated.  Allergies and medications reviewed and updated. Current Outpatient Prescriptions on File Prior to Visit  Medication Sig  . acetaminophen (TYLENOL) 325 MG tablet Take 650 mg by mouth every 6 (six) hours as needed for pain (prn).  Marland Kitchen estradiol (CLIMARA) 0.025 mg/24hr Place 1 patch (0.025 mg total) onto the skin every 30 (thirty) days.  Marland Kitchen ibuprofen (ADVIL,MOTRIN) 200 MG tablet Take 200 mg by mouth every 6 (six) hours as needed for pain (prn).   No current facility-administered medications on file prior to visit.    Review of Systems Per HPI unless specifically indicated above    Objective:    BP 112/84  Pulse 76  Temp(Src) 97.8 F (36.6 C) (Oral)  Wt 197 lb (89.359 kg)  SpO2 97%  LMP 06/22/2013  Physical Exam  Nursing note and vitals reviewed. Constitutional: She appears well-developed and well-nourished. No distress.  HENT:  Head: Normocephalic and atraumatic.  Right Ear: Hearing, tympanic membrane, external ear and ear canal normal.  Left Ear: Hearing, tympanic membrane, external ear and ear canal normal.  Nose: Mucosal edema and rhinorrhea  present. Right sinus exhibits no maxillary sinus tenderness and no frontal sinus tenderness. Left sinus exhibits no maxillary sinus tenderness and no frontal sinus tenderness.  Mouth/Throat: Uvula is midline and mucous membranes are normal. Posterior oropharyngeal erythema present. No oropharyngeal exudate, posterior oropharyngeal edema or tonsillar abscesses.  Nasal mucosal irritation with raw nares  Eyes: Conjunctivae and EOM are normal. Pupils are equal, round, and reactive to light. No scleral icterus.  Neck: Normal range of motion. Neck supple.  Cardiovascular: Normal rate, regular rhythm, normal heart sounds and intact distal pulses.   No murmur heard. Pulmonary/Chest: Effort normal and breath sounds normal. No respiratory distress. She has no wheezes. She has no rales.  Few rhonchi right upper lobe o/w clear lungs  Lymphadenopathy:    She has no cervical adenopathy.  Skin: Skin is warm and dry. No rash noted.      Assessment & Plan:   Problem List Items Addressed This Visit   Acute bronchitis - Primary     Given duration and progression of sxs, will treat with azithromycin with codeine cough syrup at night time. See pt instructions for further supportive care. Out of work note provided or today and tomorrow.        Follow up plan: Return if symptoms worsen or fail to improve.

## 2013-07-13 NOTE — Patient Instructions (Signed)
I think you have bronchitis - treat with zpack and codeine cough syrup for night time. Start plain mucinex or immediate release guaifenesin with plenty of water to help mobilize mucous. Out of work for next 1-2 days. Let us know if fever >101, worsening productive cough, or not improving as expected.

## 2013-07-13 NOTE — Progress Notes (Signed)
Pre visit review using our clinic review tool, if applicable. No additional management support is needed unless otherwise documented below in the visit note. 

## 2013-08-04 ENCOUNTER — Ambulatory Visit (INDEPENDENT_AMBULATORY_CARE_PROVIDER_SITE_OTHER): Payer: 59 | Admitting: Family Medicine

## 2013-08-04 ENCOUNTER — Encounter: Payer: Self-pay | Admitting: Family Medicine

## 2013-08-04 VITALS — BP 108/68 | HR 86 | Temp 98.3°F | Wt 198.0 lb

## 2013-08-04 DIAGNOSIS — B9789 Other viral agents as the cause of diseases classified elsewhere: Secondary | ICD-10-CM

## 2013-08-04 DIAGNOSIS — B349 Viral infection, unspecified: Secondary | ICD-10-CM | POA: Insufficient documentation

## 2013-08-04 MED ORDER — BENZONATATE 200 MG PO CAPS: 200.0000 mg | ORAL_CAPSULE | Freq: Three times a day (TID) | ORAL | Status: DC | PRN

## 2013-08-04 NOTE — Patient Instructions (Signed)
Use tessalon for cough, drink sips of fluids and try to get some rest.   Take care.   Glad to see you.

## 2013-08-04 NOTE — Assessment & Plan Note (Signed)
Likely viral process, likely note related to prev illness. D/w pt.  Okay for outpatient f/u.  Nontoxic. Tessalon for cough, rest and fluids, f/u prn.  She agrees.

## 2013-08-04 NOTE — Progress Notes (Signed)
Pre visit review using our clinic review tool, if applicable. No additional management support is needed unless otherwise documented below in the visit note.  Cough started about 6 weeks ago.  ~4 wks ago, started on zmax and cough syrup.  Cough got a lot better at that point.  Then it started getting worse in the last 4-5 days.  Fever on Monday night.  Chills.  Out of work Tuesday and today.   Some sputum, dec in oral solids today, no appetite.  Vomited from coughing today.  No diarrhea.  No ear pain.  ST, irritated.  Some rhinorrhea, less than at the also OV.  Former smoker.   Meds, vitals, and allergies reviewed.   ROS: See HPI.  Otherwise, noncontributory.  GEN: nad, alert and oriented HEENT: mucous membranes moist, tm w/o erythema, nasal exam w/o erythema, clear discharge noted,  OP with cobblestoning NECK: supple w/o LA CV: rrr.   PULM: ctab, no inc wob EXT: no edema SKIN: blanching rash on chest and upper back, developed today per patient report.

## 2013-08-25 ENCOUNTER — Other Ambulatory Visit: Payer: Self-pay

## 2013-08-25 DIAGNOSIS — Z1231 Encounter for screening mammogram for malignant neoplasm of breast: Secondary | ICD-10-CM

## 2013-09-30 ENCOUNTER — Ambulatory Visit
Admission: RE | Admit: 2013-09-30 | Discharge: 2013-09-30 | Disposition: A | Discharge status: Home / Self Care | Payer: 59 | Source: Ambulatory Visit

## 2013-09-30 ENCOUNTER — Encounter (INDEPENDENT_AMBULATORY_CARE_PROVIDER_SITE_OTHER): Payer: Self-pay

## 2013-09-30 DIAGNOSIS — Z1231 Encounter for screening mammogram for malignant neoplasm of breast: Secondary | ICD-10-CM

## 2013-10-04 ENCOUNTER — Other Ambulatory Visit: Payer: Self-pay | Admitting: Obstetrics and Gynecology

## 2013-10-04 DIAGNOSIS — R928 Other abnormal and inconclusive findings on diagnostic imaging of breast: Secondary | ICD-10-CM

## 2013-10-14 ENCOUNTER — Ambulatory Visit
Admission: RE | Admit: 2013-10-14 | Discharge: 2013-10-14 | Disposition: A | Discharge status: Home / Self Care | Payer: 59 | Source: Ambulatory Visit | Attending: Obstetrics and Gynecology | Admitting: Obstetrics and Gynecology

## 2013-10-14 ENCOUNTER — Other Ambulatory Visit: Payer: Self-pay | Admitting: Obstetrics and Gynecology

## 2013-10-14 DIAGNOSIS — R928 Other abnormal and inconclusive findings on diagnostic imaging of breast: Secondary | ICD-10-CM

## 2013-11-09 ENCOUNTER — Ambulatory Visit (INDEPENDENT_AMBULATORY_CARE_PROVIDER_SITE_OTHER): Payer: 59 | Admitting: Family Medicine

## 2013-11-09 ENCOUNTER — Encounter: Payer: Self-pay | Admitting: Family Medicine

## 2013-11-09 VITALS — BP 110/72 | HR 80 | Temp 98.3°F | Ht 64.0 in | Wt 190.5 lb

## 2013-11-09 DIAGNOSIS — R1013 Epigastric pain: Secondary | ICD-10-CM

## 2013-11-09 LAB — COMPREHENSIVE METABOLIC PANEL
ALBUMIN: 3.9 g/dL (ref 3.5–5.2)
ALT: 23 U/L (ref 0–35)
AST: 17 U/L (ref 0–37)
Alkaline Phosphatase: 69 U/L (ref 39–117)
BILIRUBIN TOTAL: 0.4 mg/dL (ref 0.2–1.2)
BUN: 11 mg/dL (ref 6–23)
CALCIUM: 9.1 mg/dL (ref 8.4–10.5)
CO2: 30 mEq/L (ref 19–32)
Chloride: 104 mEq/L (ref 96–112)
Creatinine, Ser: 0.9 mg/dL (ref 0.4–1.2)
GFR: 68.62 mL/min (ref >60.00)
GLUCOSE: 90 mg/dL (ref 70–99)
Potassium: 4.2 mEq/L (ref 3.5–5.1)
SODIUM: 137 meq/L (ref 135–145)
TOTAL PROTEIN: 7.1 g/dL (ref 6.0–8.3)

## 2013-11-09 LAB — CBC WITH DIFFERENTIAL/PLATELET
Basophils Absolute: 0 10*3/uL (ref 0.0–0.1)
Basophils Relative: 0.3 % (ref 0.0–3.0)
EOS PCT: 2.4 % (ref 0.0–5.0)
Eosinophils Absolute: 0.2 10*3/uL (ref 0.0–0.7)
HCT: 43.6 % (ref 36.0–46.0)
Hemoglobin: 14.7 g/dL (ref 12.0–15.0)
LYMPHS PCT: 24 % (ref 12.0–46.0)
Lymphs Abs: 2.3 10*3/uL (ref 0.7–4.0)
MCHC: 33.8 g/dL (ref 30.0–36.0)
MCV: 92.6 fl (ref 78.0–100.0)
MONOS PCT: 10.4 % (ref 3.0–12.0)
Monocytes Absolute: 1 10*3/uL (ref 0.1–1.0)
NEUTROS PCT: 62.9 % (ref 43.0–77.0)
Neutro Abs: 6.1 10*3/uL (ref 1.4–7.7)
PLATELETS: 403 10*3/uL — AB (ref 150.0–400.0)
RBC: 4.71 Mil/uL (ref 3.87–5.11)
RDW: 12.3 % (ref 11.5–15.5)
WBC: 9.7 10*3/uL (ref 4.0–10.5)

## 2013-11-09 LAB — LIPASE: LIPASE: 29 U/L (ref 11.0–59.0)

## 2013-11-09 NOTE — Patient Instructions (Addendum)
Stop at lab on way out.  Start prilosec 2 tabs of 20 mg daily plan 2-4 weeks. Can use tylenol for pain. Call if pain is worsening, if severe pain and cannot keep down liquids go to ER. Push fluids, avoid acid triggers.

## 2013-11-09 NOTE — Progress Notes (Signed)
   Subjective:    Patient ID: Kelsey Carlson, female    DOB: March 12, 1966, 48 y.o.   MRN: [id number]  Abdominal Pain Pertinent negatives include no dysuria or fever.    48 year old female presents with  new onset pain epigastric pain  after eating hot wings 3 days ago. Since then constant pain (4-8/10 on pain scale, some  Improvement) PAin radiates to back and to right side)  She has had some nausea and central chest pain prior after eating last week. No exertional pain, no SOB.  She is nauseous.  No fever, no diarrhea, no constipation. No blood in stool.  She feels better if she eats small amounts.  She uses ibuprofen only occasionally. She took TUMs on day pain started with no relief.    She has history of ? gallbladder disease in 2010,  She has been treating symptomatically. Pain had not been this bad in a long time.    Review of Systems  Constitutional: Negative for fever and fatigue.  HENT: Negative for ear pain.   Eyes: Negative for pain.  Respiratory: Negative for chest tightness and shortness of breath.   Cardiovascular: Negative for chest pain, palpitations and leg swelling.  Gastrointestinal: Positive for abdominal pain.  Genitourinary: Negative for dysuria.       Objective:   Physical Exam  Constitutional: Vital signs are normal. She appears well-developed and well-nourished. She is cooperative.  Non-toxic appearance. She does not appear ill. No distress.  HENT:  Head: Normocephalic.  Right Ear: Hearing, tympanic membrane, external ear and ear canal normal. Tympanic membrane is not erythematous, not retracted and not bulging.  Left Ear: Hearing, tympanic membrane, external ear and ear canal normal. Tympanic membrane is not erythematous, not retracted and not bulging.  Nose: No mucosal edema or rhinorrhea. Right sinus exhibits no maxillary sinus tenderness and no frontal sinus tenderness. Left sinus exhibits no maxillary sinus tenderness and no frontal sinus tenderness.   Mouth/Throat: Uvula is midline, oropharynx is clear and moist and mucous membranes are normal.  Eyes: Conjunctivae, EOM and lids are normal. Pupils are equal, round, and reactive to light. Lids are everted and swept, no foreign bodies found.  Neck: Trachea normal and normal range of motion. Neck supple. Carotid bruit is not present. No mass and no thyromegaly present.  Cardiovascular: Normal rate, regular rhythm, S1 normal, S2 normal, normal heart sounds, intact distal pulses and normal pulses.  Exam reveals no gallop and no friction rub.   No murmur heard. Pulmonary/Chest: Effort normal and breath sounds normal. Not tachypneic. No respiratory distress. She has no decreased breath sounds. She has no wheezes. She has no rhonchi. She has no rales. She exhibits no tenderness.  Abdominal: Soft. Normal appearance and bowel sounds are normal. There is no hepatosplenomegaly. There is tenderness in the right upper quadrant and epigastric area. There is no rebound and no CVA tenderness. No hernia.  Neurological: She is alert.  Skin: Skin is warm, dry and intact. No rash noted.  Psychiatric: Her speech is normal and behavior is normal. Judgment and thought content normal. Her mood appears not anxious. Cognition and memory are normal. She does not exhibit a depressed mood.          Assessment & Plan:

## 2013-11-09 NOTE — Assessment & Plan Note (Signed)
Gastritis vs PUD vs gallstone vs pnancreatitis.  Eval with labs.  Start PPI, tylenol for pain.  Consider [place] RUQ.

## 2013-11-09 NOTE — Progress Notes (Signed)
Pre visit review using our clinic review tool, if applicable. No additional management support is needed unless otherwise documented below in the visit note. 

## 2013-12-29 ENCOUNTER — Encounter: Payer: Self-pay | Admitting: Family Medicine

## 2013-12-29 ENCOUNTER — Ambulatory Visit (INDEPENDENT_AMBULATORY_CARE_PROVIDER_SITE_OTHER): Payer: 59 | Admitting: Family Medicine

## 2013-12-29 VITALS — BP 104/70 | HR 63 | Temp 98.1°F | Ht 64.0 in | Wt 189.8 lb

## 2013-12-29 DIAGNOSIS — M25562 Pain in left knee: Secondary | ICD-10-CM

## 2013-12-29 DIAGNOSIS — M25569 Pain in unspecified knee: Secondary | ICD-10-CM

## 2013-12-29 MED ORDER — DICLOFENAC SODIUM 75 MG PO TBEC: 75.0000 mg | DELAYED_RELEASE_TABLET | Freq: Two times a day (BID) | ORAL | Status: DC

## 2013-12-29 NOTE — Progress Notes (Signed)
Pre visit review using our clinic review tool, if applicable. No additional management support is needed unless otherwise documented below in the visit note. 

## 2013-12-29 NOTE — Progress Notes (Signed)
.     12/29/2013    ID: Kelsey Carlson   MRN: [id number]  DOB: [birth year]  Primary Physician:  Eliezer Lofts, MD  Chief Complaint: Joint Swelling  Subjective:   History of Present Illness:  This 48 y.o. female patient presents with 3 week h/o sided knee pain after bowling 3 games successively. No audible pop was heard. The patient has not had an effusion. No symptomatic giving-way. No mechanical clicking. Joint has not locked up. Patient has been able to walk but is limping. The patient does not have pain going up and down stairs or rising from a seated position.   3 weeks ago, tried to bowl 3 times. Fast Med a couple of weeks ago.  Took some nsaids, hurt with twisting.   Pain location: anteromedial Current physical activity: minimal Prior Knee Surgery: none Bracing: none Occupation or school level:  The PMH, PSH, Social History, Family History, Medications, and allergies have been reviewed in [facility], and have been updated if relevant.  ROS: no acute illness or fever MSK: above GI: tol po intake without nausea or vomitting. Neuro: no numbness, tingling, or radiculopathy O/w per hpi  Objective:   PHYSICAL EXAM  Blood pressure 104/70, pulse 63, temperature 98.1 F (36.7 C), temperature source Oral, height 5\' 4"  (1.626 m), weight 189 lb 12 oz (86.07 kg), last menstrual period 12/27/2013.  GEN: Well-developed,well-nourished,in no acute distress; alert,appropriate and cooperative throughout examination HEENT: Normocephalic and atraumatic without obvious abnormalities. Ears, externally no deformities PULM: Breathing comfortably in no respiratory distress EXT: No clubbing, cyanosis, or edema PSYCH: Normally interactive. Cooperative during the interview. Pleasant. Friendly and conversant. Not anxious or depressed appearing. Normal, full affect.  Left knee pain Gait: Normal heel toe pattern ROM: 0-110 Effusion: neg Echymosis or edema: none Patellar tendon NT Painful PLICA:  neg Patellar grind: negative Medial and lateral patellar facet loading: negative medial and lateral joint lines: medial joint line ttp Mcmurray's pos for pain Flexion-pinch pain Varus and valgus stress: stable Lachman: neg Ant and Post drawer: neg Hip abduction, IR, ER: WNL Hip flexion str: 5/5 Hip abd: 5/5 Quad: 5/5 VMO atrophy:No Hamstring concentric and eccentric: 5/5  Radiology: No results found.  Assessment & Plan:   Left knee pain  Highly suggestive of meniscal pathology vs possibly oa flare in a 48 yo. She wants to be conservative, which is reasonable. NSAID trial. RICE.  We discussed injection, advanced imaging - not interested.  Follow-up: No Follow-up on file.  New Prescriptions   DICLOFENAC (VOLTAREN) 75 MG EC TABLET    Take 1 tablet (75 mg total) by mouth 2 (two) times daily.   No orders of the defined types were placed in this encounter.    Signed,  Maud Deed. [name], MD, [facility] [place], [address] Phone: [phone] Fax: [phone]   Discontinued Medications   No medications on file   Modified Medications   No medications on file

## 2014-03-21 ENCOUNTER — Encounter: Payer: Self-pay | Admitting: Family Medicine

## 2014-08-29 ENCOUNTER — Encounter: Payer: Self-pay | Admitting: Primary Care

## 2014-08-29 ENCOUNTER — Ambulatory Visit (INDEPENDENT_AMBULATORY_CARE_PROVIDER_SITE_OTHER): Payer: Managed Care, Other (non HMO) | Admitting: Primary Care

## 2014-08-29 VITALS — BP 118/84 | HR 74 | Temp 97.5°F | Ht 64.0 in | Wt 193.4 lb

## 2014-08-29 DIAGNOSIS — M545 Low back pain, unspecified: Secondary | ICD-10-CM

## 2014-08-29 MED ORDER — CYCLOBENZAPRINE HCL 5 MG PO TABS: ORAL_TABLET | ORAL | Status: DC

## 2014-08-29 NOTE — Patient Instructions (Signed)
You may take 1/2 to 1 tablet of flexeril three times daily as needed for muscle spasms. You will be contacted regarding your referral to orthopedics. Continue ibuprofen for pain and inflammation.  Follow up with either myself or your PCP in 6 weeks.

## 2014-08-29 NOTE — Progress Notes (Signed)
Pre visit review using our clinic review tool, if applicable. No additional management support is needed unless otherwise documented below in the visit note. 

## 2014-08-29 NOTE — Progress Notes (Signed)
Subjective:    Patient ID: Kelsey Carlson, female    DOB: 1966/04/15, 49 y.o.   MRN: [id number]  HPI  Mr. Gergely is a 49 year old female who presents today with a chief complaint of chronic right and left lower back pain with a "flare up" of pain starting Saturday night this past weekend. She describes her pain as constant without radiculopathy. She did experience numbness/tingling to the plantar surface of bilateral feet on Saturday after her daughter accidentally ran into her with a shopping cart hitting her achilles tendons. The tingling to her feet quickly dissipated and her back pain does feel better today after taking ibuprofen. She also reports a "pinching" sensation to the posterior aspect of the right side of her neck since Saturday as well. Denies recent injury/trauma.  She does have a history of herniated disc with discectomy 20 years ago and a second bulging disc that (per patient) never underwent operation. Her pain is typically bilateral and to the lower back and appears when she "does too much around the house". Her symptoms will typically will go away with position change. Sitting makes her pain worse. She denies radiculopathy and will typically take advil for her pain. She will experience one flare up a week and reports it's getting to the point where she can't do certain house keeping chores without pain.   Review of Systems  Constitutional: Negative for fever and chills.  Respiratory: Negative for shortness of breath.   Cardiovascular: Negative for chest pain.  Gastrointestinal: Negative for diarrhea and constipation.  Genitourinary: Negative for difficulty urinating.       No loss of bladder control.  Musculoskeletal: Positive for myalgias, back pain and neck pain.  Neurological: Negative for dizziness and weakness.       Numbness/tingling to plantar feet Sunday. None since. No radiculopathy.       Past Medical History  Diagnosis Date  . Nephrolithiasis   .  Urolithiasis   . Wears glasses     History   Social History  . Marital Status: Married    Spouse Name: N/A  . Number of Children: N/A  . Years of Education: N/A   Occupational History  .  [employer] History Main Topics  . Smoking status: Former Smoker    Quit date: 05/20/1994  . Smokeless tobacco: Never Used  . Alcohol Use: Yes     Comment: socially  . Drug Use: No  . Sexual Activity: Yes    Birth Control/ Protection: Surgical     Comment: [facility] BTL   Other Topics Concern  . Not on file   Social History Narrative   Regular exercise- yes 3 x a week      Diet- fruits and veggies    Past Surgical History  Procedure Laterality Date  . Lumbar disc surgery  1997    lumb lam  . Breast biopsy  2002    lt br bx  . Bladder suspension  2008    sling-cysto  . Tubal ligation      Essure  . Breast surgery    . 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 Dilation and curettage of uterus    . Breast cyst excision Left 02/19/2013    Procedure:  EXCISION BREAST MASS;  Surgeon: Marcello Moores A. Cornett, MD;  Location: [facility];  Service: General;  Laterality: Left;    Family History  Problem  Relation Age of Onset  . Cancer Mother 6    breast  . Hypertension Father     No Known Allergies  Current Outpatient Prescriptions on File Prior to Visit  Medication Sig Dispense Refill  . ibuprofen (ADVIL,MOTRIN) 200 MG tablet Take 200 mg by mouth every 6 (six) hours as needed for pain (prn).    . diclofenac (VOLTAREN) 75 MG EC tablet Take 1 tablet (75 mg total) by mouth 2 (two) times daily. (Patient not taking: Reported on 08/29/2014) 60 tablet 3  . estradiol (CLIMARA) 0.025 mg/24hr Place 1 patch (0.025 mg total) onto the skin every 30 (thirty) days. (Patient not taking: Reported on 08/29/2014) 4 patch 1   No current facility-administered medications on file prior to visit.    BP 118/84 mmHg  Pulse 74  Temp(Src) 97.5 F (36.4 C)  (Oral)  Ht 5\' 4"  (1.626 m)  Wt 193 lb 6.4 oz (87.726 kg)  BMI 33.18 kg/m2  SpO2 98%  LMP 08/18/2014    Objective:   Physical Exam  Constitutional: She is oriented to person, place, and time. She appears well-developed.  Neck: Neck supple.  Cardiovascular: Normal rate and regular rhythm.   Pulmonary/Chest: Effort normal and breath sounds normal.  Musculoskeletal:       Lumbar back: She exhibits decreased range of motion and pain.  Pain with straigt leg raise on right. No radiculopathy. Pain with forward flexion and right lateral flexion. No pain upon left straight leg raise or left lateral flexion. No pain upon hip flexion bilaterally.   Lymphadenopathy:    She has no cervical adenopathy.  Neurological: She is alert and oriented to person, place, and time. She has normal reflexes. No cranial nerve deficit.  Skin: Skin is warm and dry.  Psychiatric: She has a normal mood and affect.          Assessment & Plan:

## 2014-08-29 NOTE — Assessment & Plan Note (Signed)
Appears to MSK related; however, due to longevity of symptoms, worsening of pain over the years, and inability to complete daily household chores, will refer to orthopedics for further evaluation. Ibuprofen for pain and inflammation, flexeril at bedtime for muscle spasm of right neck and lower back. Follow up in 6 weeks for re-evaluation.

## 2014-09-21 ENCOUNTER — Other Ambulatory Visit: Payer: Self-pay | Admitting: Specialist

## 2014-09-21 DIAGNOSIS — M545 Low back pain: Secondary | ICD-10-CM

## 2014-09-23 ENCOUNTER — Ambulatory Visit (INDEPENDENT_AMBULATORY_CARE_PROVIDER_SITE_OTHER): Payer: Managed Care, Other (non HMO) | Admitting: Primary Care

## 2014-09-23 ENCOUNTER — Encounter: Payer: Self-pay | Admitting: Primary Care

## 2014-09-23 VITALS — BP 116/86 | HR 94 | Temp 97.8°F | Ht 64.0 in | Wt 195.4 lb

## 2014-09-23 DIAGNOSIS — R1013 Epigastric pain: Secondary | ICD-10-CM

## 2014-09-23 LAB — CBC WITH DIFFERENTIAL/PLATELET
Basophils Absolute: 0.1 10*3/uL (ref 0.0–0.1)
Basophils Relative: 0.4 % (ref 0.0–3.0)
EOS PCT: 2.5 % (ref 0.0–5.0)
Eosinophils Absolute: 0.3 10*3/uL (ref 0.0–0.7)
HEMATOCRIT: 42.6 % (ref 36.0–46.0)
HEMOGLOBIN: 14.8 g/dL (ref 12.0–15.0)
LYMPHS ABS: 2.2 10*3/uL (ref 0.7–4.0)
Lymphocytes Relative: 17.8 % (ref 12.0–46.0)
MCHC: 34.8 g/dL (ref 30.0–36.0)
MCV: 89.5 fl (ref 78.0–100.0)
Monocytes Absolute: 1.1 10*3/uL — ABNORMAL HIGH (ref 0.1–1.0)
Monocytes Relative: 8.7 % (ref 3.0–12.0)
NEUTROS ABS: 8.7 10*3/uL — AB (ref 1.4–7.7)
Neutrophils Relative %: 70.6 % (ref 43.0–77.0)
Platelets: 369 10*3/uL (ref 150.0–400.0)
RBC: 4.76 Mil/uL (ref 3.87–5.11)
RDW: 12.4 % (ref 11.5–15.5)
WBC: 12.3 10*3/uL — ABNORMAL HIGH (ref 4.0–10.5)

## 2014-09-23 LAB — COMPREHENSIVE METABOLIC PANEL
ALT: 26 U/L (ref 0–35)
AST: 18 U/L (ref 0–37)
Albumin: 4 g/dL (ref 3.5–5.2)
Alkaline Phosphatase: 76 U/L (ref 39–117)
BUN: 10 mg/dL (ref 6–23)
CALCIUM: 9.6 mg/dL (ref 8.4–10.5)
CHLORIDE: 103 meq/L (ref 96–112)
CO2: 31 mEq/L (ref 19–32)
Creatinine, Ser: 0.82 mg/dL (ref 0.40–1.20)
GFR: 79.06 mL/min (ref >60.00)
Glucose, Bld: 105 mg/dL — ABNORMAL HIGH (ref 70–99)
Potassium: 4.1 mEq/L (ref 3.5–5.1)
SODIUM: 136 meq/L (ref 135–145)
Total Bilirubin: 0.7 mg/dL (ref 0.2–1.2)
Total Protein: 7.3 g/dL (ref 6.0–8.3)

## 2014-09-23 LAB — LIPASE: Lipase: 18 U/L (ref 11.0–59.0)

## 2014-09-23 MED ORDER — OMEPRAZOLE 20 MG PO CPDR: 20.0000 mg | DELAYED_RELEASE_CAPSULE | Freq: Every day | ORAL | Status: DC

## 2014-09-23 NOTE — Progress Notes (Signed)
Pre visit review using our clinic review tool, if applicable. No additional management support is needed unless otherwise documented below in the visit note. 

## 2014-09-23 NOTE — Assessment & Plan Note (Addendum)
Epigastric and LUQ pain. ECG unremarkable. NSR. Suspect GERD with gastritis due to symptoms, but will rule out cholelithiasis, liver involvement and pancreatitis.  CMP, CBC, Lipase today. Will call patient with results. Start omeprazole 20 mg daily. Go to ER if symptoms worsen over weekend.

## 2014-09-23 NOTE — Patient Instructions (Addendum)
Complete lab work prior to leaving today. I will notify you of your results. Your ECG does not show any cardiac abnormalities. Stop taking ibuprofen, diclofenac, motrin, advil as it will cause irritation to your stomach.  You will need to stop spicy, greasy, acidic foods for a few weeks. Start omeprazole tablets. Take one tablet by mouth daily. If you experience worsening pain, fevers, bloody stools over the weekend please go to the emergency department.  Gastritis, Adult Gastritis is soreness and swelling (inflammation) of the lining of the stomach. Gastritis can develop as a sudden onset (acute) or long-term (chronic) condition. If gastritis is not treated, it can lead to stomach bleeding and ulcers. CAUSES  Gastritis occurs when the stomach lining is weak or damaged. Digestive juices from the stomach then inflame the weakened stomach lining. The stomach lining may be weak or damaged due to viral or bacterial infections. One common bacterial infection is the Helicobacter pylori infection. Gastritis can also result from excessive alcohol consumption, taking certain medicines, or having too much acid in the stomach.  SYMPTOMS  In some cases, there are no symptoms. When symptoms are present, they may include:  Pain or a burning sensation in the upper abdomen.  Nausea.  Vomiting.  An uncomfortable feeling of fullness after eating. DIAGNOSIS  Your caregiver may suspect you have gastritis based on your symptoms and a physical exam. To determine the cause of your gastritis, your caregiver may perform the following:  Blood or stool tests to check for the H pylori bacterium.  Gastroscopy. A thin, flexible tube (endoscope) is passed down the esophagus and into the stomach. The endoscope has a light and camera on the end. Your caregiver uses the endoscope to view the inside of the stomach.  Taking a tissue sample (biopsy) from the stomach to examine under a microscope. TREATMENT  Depending on  the cause of your gastritis, medicines may be prescribed. If you have a bacterial infection, such as an H pylori infection, antibiotics may be given. If your gastritis is caused by too much acid in the stomach, H2 blockers or antacids may be given. Your caregiver may recommend that you stop taking aspirin, ibuprofen, or other nonsteroidal anti-inflammatory drugs (NSAIDs). HOME CARE INSTRUCTIONS  Only take over-the-counter or prescription medicines as directed by your caregiver.  If you were given antibiotic medicines, take them as directed. Finish them even if you start to feel better.  Drink enough fluids to keep your urine clear or pale yellow.  Avoid foods and drinks that make your symptoms worse, such as:  Caffeine or alcoholic drinks.  Chocolate.  Peppermint or mint flavorings.  Garlic and onions.  Spicy foods.  Citrus fruits, such as oranges, lemons, or limes.  Tomato-based foods such as sauce, chili, salsa, and pizza.  Fried and fatty foods.  Eat small, frequent meals instead of large meals. SEEK IMMEDIATE MEDICAL CARE IF:   You have black or dark red stools.  You vomit blood or material that looks like coffee grounds.  You are unable to keep fluids down.  Your abdominal pain gets worse.  You have a fever.  You do not feel better after 1 week.  You have any other questions or concerns. MAKE SURE YOU:  Understand these instructions.  Will watch your condition.  Will get help right away if you are not doing well or get worse. Document Released: 04/30/2001 Document Revised: 11/05/2011 Document Reviewed: 06/19/2011 [facility] Patient Information 2015 [place], [place]. This information is not intended to replace  advice given to you by your health care provider. Make sure you discuss any questions you have with your health care provider.  

## 2014-09-23 NOTE — Progress Notes (Signed)
Subjective:    Patient ID: Kelsey Carlson, female    DOB: April 01, 1966, 49 y.o.   MRN: [id number]  HPI  Kelsey Carlson is a 49 year old female who presents today with a chief complaint of epigastric pain with radiaiton through back and to the LUQ for the past 2 days. She noticed some "heartburn" 2 days ago with some epigastric pain. Yesterday she had a taco salad and experienced worsening pain and now cannot eat much without experiencing pain. Denies pain to RUQ, nausea, vomiting, fevers, chills. She also reports feeling chest tightness substernally 2 days ago which dissipated. She's been taking ibuprofen for her back pain, but also noticed GI upset when taking.   Review of Systems  Constitutional: Negative for fever and chills.  Respiratory: Negative for shortness of breath.   Cardiovascular: Positive for chest pain. Negative for palpitations.  Gastrointestinal: Positive for abdominal pain. Negative for nausea, vomiting, diarrhea, constipation and blood in stool.  Neurological: Negative for dizziness and weakness.       Past Medical History  Diagnosis Date  . Nephrolithiasis   . Urolithiasis   . Wears glasses     History   Social History  . Marital Status: Married    Spouse Name: N/A  . Number of Children: N/A  . Years of Education: N/A   Occupational History  .  [employer] History Main Topics  . Smoking status: Former Smoker    Quit date: 05/20/1994  . Smokeless tobacco: Never Used  . Alcohol Use: Yes     Comment: socially  . Drug Use: No  . Sexual Activity: Yes    Birth Control/ Protection: Surgical     Comment: Essure BTL   Other Topics Concern  . Not on file   Social History Narrative   Regular exercise- yes 3 x a week      Diet- fruits and veggies    Past Surgical History  Procedure Laterality Date  . Lumbar disc surgery  1997    lumb lam  . Breast biopsy  2002    lt br bx  . Bladder suspension  2008    sling-cysto  . Tubal  ligation      Essure  . Breast surgery    . 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 Dilation and curettage of uterus    . Breast cyst excision Left 02/19/2013    Procedure:  EXCISION BREAST MASS;  Surgeon: Marcello Moores A. Cornett, MD;  Location: [facility];  Service: General;  Laterality: Left;    Family History  Problem Relation Age of Onset  . Cancer Mother 74    breast  . Hypertension Father     No Known Allergies  Current Outpatient Prescriptions on File Prior to Visit  Medication Sig Dispense Refill  . cyclobenzaprine (FLEXERIL) 5 MG tablet Take 1/2 to 1 tablet by mouth three times daily as needed for muscle spasms. (Patient not taking: Reported on 09/23/2014) 30 tablet 0  . diclofenac (VOLTAREN) 75 MG EC tablet Take 1 tablet (75 mg total) by mouth 2 (two) times daily. (Patient not taking: Reported on 08/29/2014) 60 tablet 3  . estradiol (CLIMARA) 0.025 mg/24hr Place 1 patch (0.025 mg total) onto the skin every 30 (thirty) days. (Patient not taking: Reported on 08/29/2014) 4 patch 1  . ibuprofen (ADVIL,MOTRIN) 200 MG tablet Take 200 mg by mouth every 6 (six) hours as needed  for pain (prn).     No current facility-administered medications on file prior to visit.    BP 116/86 mmHg  Pulse 94  Temp(Src) 97.8 F (36.6 C) (Oral)  Ht 5\' 4"  (1.626 m)  Wt 195 lb 6.4 oz (88.633 kg)  BMI 33.52 kg/m2  SpO2 97%  LMP 09/09/2014    Objective:   Physical Exam  Constitutional: She is oriented to person, place, and time. She does not appear ill.  Neck: Neck supple.  Cardiovascular: Normal rate and regular rhythm.   Pulmonary/Chest: Effort normal and breath sounds normal.  Abdominal: Soft. Bowel sounds are normal. She exhibits no distension. There is no splenomegaly or hepatomegaly. There is tenderness in the epigastric area and left upper quadrant. There is no rebound, no guarding, no tenderness at McBurney's point and negative Murphy's sign.  Lymphadenopathy:     She has no cervical adenopathy.  Neurological: She is alert and oriented to person, place, and time.  Skin: Skin is warm and dry.          Assessment & Plan:  ECG: Unremarkable Regular rate and rhythm (NSR), no t-wave inversion, ST elevation.

## 2014-10-04 ENCOUNTER — Other Ambulatory Visit: Payer: Self-pay

## 2014-10-10 ENCOUNTER — Ambulatory Visit: Payer: Self-pay | Admitting: Family Medicine

## 2014-12-08 ENCOUNTER — Ambulatory Visit (INDEPENDENT_AMBULATORY_CARE_PROVIDER_SITE_OTHER): Payer: Managed Care, Other (non HMO) | Admitting: Family Medicine

## 2014-12-08 ENCOUNTER — Encounter: Payer: Self-pay | Admitting: Family Medicine

## 2014-12-08 VITALS — BP 102/74 | HR 101 | Temp 98.5°F | Ht 64.0 in | Wt 197.0 lb

## 2014-12-08 DIAGNOSIS — I83893 Varicose veins of bilateral lower extremities with other complications: Secondary | ICD-10-CM

## 2014-12-08 DIAGNOSIS — E669 Obesity, unspecified: Secondary | ICD-10-CM | POA: Insufficient documentation

## 2014-12-08 DIAGNOSIS — Z6834 Body mass index (BMI) 34.0-34.9, adult: Secondary | ICD-10-CM | POA: Insufficient documentation

## 2014-12-08 DIAGNOSIS — Z6833 Body mass index (BMI) 33.0-33.9, adult: Secondary | ICD-10-CM

## 2014-12-08 DIAGNOSIS — R609 Edema, unspecified: Secondary | ICD-10-CM

## 2014-12-08 DIAGNOSIS — R600 Localized edema: Secondary | ICD-10-CM

## 2014-12-08 NOTE — Progress Notes (Signed)
   Subjective:    Patient ID: Kelsey Carlson, female    DOB: 1966/02/18, 49 y.o.   MRN: [id number]  HPI  49 year old female presents with new onset swelling in legs.  She reports that she noted swelling in both legs, left greater than right.  Feet aching , throbbing. She has been wearing dress shoes all this week. Sitting more this week.  No increase in salt in diet.  No recent travel. 4 lbs weight gain in last 2 months. Wt Readings from Last 3 Encounters:  12/08/14 197 lb (89.359 kg)  09/23/14 195 lb 6.4 oz (88.633 kg)  08/29/14 193 lb 6.4 oz (87.726 kg)   BP Readings from Last 3 Encounters:  12/08/14 102/74  09/23/14 116/86  08/29/14 118/84     She has been tired all the time. No cold intolerance.  She has not started and new meds. No longer on NSAIDs.   Occ sharp pains in chest.. eval included Nml EKG in 09/2014. Better now, no exertional chest pain.   Social History /Family History/Past Medical History reviewed and updated if needed. Has  history of varicose vein, mild, spider veins.  Body mass index is 33.8 kg/(m^2).   Review of Systems  Constitutional: Positive for fatigue.  Respiratory: Negative for shortness of breath.   Cardiovascular: Positive for leg swelling. Negative for chest pain and palpitations.       No exertional chest pain       Objective:   Physical Exam  Constitutional: Vital signs are normal. She appears well-developed and well-nourished. She is cooperative.  Non-toxic appearance. She does not appear ill. No distress.  HENT:  Head: Normocephalic.  Right Ear: Hearing, tympanic membrane, external ear and ear canal normal. Tympanic membrane is not erythematous, not retracted and not bulging.  Left Ear: Hearing, tympanic membrane, external ear and ear canal normal. Tympanic membrane is not erythematous, not retracted and not bulging.  Nose: No mucosal edema or rhinorrhea. Right sinus exhibits no maxillary sinus tenderness and no frontal sinus  tenderness. Left sinus exhibits no maxillary sinus tenderness and no frontal sinus tenderness.  Mouth/Throat: Uvula is midline, oropharynx is clear and moist and mucous membranes are normal.  Eyes: Conjunctivae, EOM and lids are normal. Pupils are equal, round, and reactive to light. Lids are everted and swept, no foreign bodies found.  Neck: Trachea normal and normal range of motion. Neck supple. Carotid bruit is not present. No thyroid mass and no thyromegaly present.  Cardiovascular: Normal rate, regular rhythm, S1 normal, S2 normal, normal heart sounds, intact distal pulses and normal pulses.  Exam reveals no gallop and no friction rub.   No murmur heard. 1 plus pitting edema bilateral ankles  spider veins on calf and behind left knee  Pulmonary/Chest: Effort normal and breath sounds normal. No tachypnea. No respiratory distress. She has no decreased breath sounds. She has no wheezes. She has no rhonchi. She has no rales.  Abdominal: Soft. Normal appearance and bowel sounds are normal. There is no tenderness.  Neurological: She is alert.  Skin: Skin is warm, dry and intact. No rash noted.  Psychiatric: Her speech is normal and behavior is normal. Judgment and thought content normal. Her mood appears not anxious. Cognition and memory are normal. She does not exhibit a depressed mood.          Assessment & Plan:

## 2014-12-08 NOTE — Assessment & Plan Note (Signed)
Encouraged exercise, weight loss, healthy eating habits. ? ?

## 2014-12-08 NOTE — Progress Notes (Signed)
Pre visit review using our clinic review tool, if applicable. No additional management support is needed unless otherwise documented below in the visit note. 

## 2014-12-08 NOTE — Assessment & Plan Note (Signed)
Likely multifactorial due to obesity, varicose veins, heat and inactivity.  Will rule out thyroid issue. Recent EKG and CMET were normal. Elevate, work on weight loss, exercise and healthy eating.  Aoid salt.  If not improving call for diuretic or compression hose ( pt refused compression hose today)

## 2014-12-08 NOTE — Patient Instructions (Addendum)
Stop at lab on way out.  Elevate legs above heart when sitting.  Work on regular exercise, walking 30 min 3 times a week.  Work on weight loss and healthy eating.

## 2014-12-09 ENCOUNTER — Encounter: Payer: Self-pay | Admitting: *Deleted

## 2014-12-09 LAB — TSH: TSH: 2.24 u[IU]/mL (ref 0.35–4.50)

## 2016-01-04 ENCOUNTER — Telehealth: Payer: Self-pay | Admitting: Family Medicine

## 2016-01-04 ENCOUNTER — Ambulatory Visit (INDEPENDENT_AMBULATORY_CARE_PROVIDER_SITE_OTHER): Payer: Managed Care, Other (non HMO) | Admitting: Adult Health

## 2016-01-04 ENCOUNTER — Encounter: Payer: Self-pay | Admitting: Adult Health

## 2016-01-04 VITALS — BP 102/60 | Temp 98.1°F | Ht 64.0 in | Wt 187.0 lb

## 2016-01-04 DIAGNOSIS — R1013 Epigastric pain: Secondary | ICD-10-CM | POA: Diagnosis not present

## 2016-01-04 LAB — URINALYSIS, ROUTINE W REFLEX MICROSCOPIC
BILIRUBIN URINE: NEGATIVE
Ketones, ur: NEGATIVE
LEUKOCYTES UA: NEGATIVE
NITRITE: NEGATIVE
Specific Gravity, Urine: <=1.005 — AB (ref 1.000–1.030)
Total Protein, Urine: NEGATIVE
UROBILINOGEN UA: 0.2 (ref 0.0–1.0)
Urine Glucose: NEGATIVE
WBC UA: NONE SEEN (ref 0–?)
pH: 7 (ref 5.0–8.0)

## 2016-01-04 LAB — CBC WITH DIFFERENTIAL/PLATELET
Basophils Absolute: 0 10*3/uL (ref 0.0–0.1)
Basophils Relative: 0.3 % (ref 0.0–3.0)
EOS PCT: 1.4 % (ref 0.0–5.0)
Eosinophils Absolute: 0.2 10*3/uL (ref 0.0–0.7)
HCT: 41.3 % (ref 36.0–46.0)
Hemoglobin: 14.4 g/dL (ref 12.0–15.0)
Lymphocytes Relative: 14.4 % (ref 12.0–46.0)
Lymphs Abs: 1.8 10*3/uL (ref 0.7–4.0)
MCHC: 34.9 g/dL (ref 30.0–36.0)
MCV: 91.2 fl (ref 78.0–100.0)
MONO ABS: 1.1 10*3/uL — AB (ref 0.1–1.0)
Monocytes Relative: 8.7 % (ref 3.0–12.0)
Neutro Abs: 9.3 10*3/uL — ABNORMAL HIGH (ref 1.4–7.7)
Neutrophils Relative %: 75.2 % (ref 43.0–77.0)
PLATELETS: 380 10*3/uL (ref 150.0–400.0)
RBC: 4.53 Mil/uL (ref 3.87–5.11)
RDW: 12.5 % (ref 11.5–15.5)
WBC: 12.3 10*3/uL — ABNORMAL HIGH (ref 4.0–10.5)

## 2016-01-04 LAB — COMPREHENSIVE METABOLIC PANEL
ALT: 12 U/L (ref 0–35)
AST: 11 U/L (ref 0–37)
Albumin: 4.3 g/dL (ref 3.5–5.2)
Alkaline Phosphatase: 63 U/L (ref 39–117)
BUN: 6 mg/dL (ref 6–23)
CALCIUM: 9.9 mg/dL (ref 8.4–10.5)
CHLORIDE: 105 meq/L (ref 96–112)
CO2: 27 meq/L (ref 19–32)
Creatinine, Ser: 0.77 mg/dL (ref 0.40–1.20)
GFR: 84.56 mL/min (ref >60.00)
Glucose, Bld: 120 mg/dL — ABNORMAL HIGH (ref 70–99)
POTASSIUM: 3.9 meq/L (ref 3.5–5.1)
Sodium: 139 mEq/L (ref 135–145)
Total Bilirubin: 0.5 mg/dL (ref 0.2–1.2)
Total Protein: 6.8 g/dL (ref 6.0–8.3)

## 2016-01-04 LAB — H. PYLORI ANTIBODY, IGG: H PYLORI IGG: NEGATIVE

## 2016-01-04 LAB — LIPASE: Lipase: 13 U/L (ref 11.0–59.0)

## 2016-01-04 NOTE — Patient Instructions (Addendum)
It was great meeting you today   I will follow up with you regarding your blood work and imaging studies  I have included foods to avoid  Start taking prilosec daily   Foods to Avoid  While your body adjusts, it's a good idea to avoid high-fat foods for a few weeks after you have this surgery.  High-fat foods include:  Foods that are fried, like french fries and potato chips High-fat meats, such as bacon, bologna, sausage, ground beef, and ribs High-fat dairy products, such as cheese, ice cream, cream, whole milk, and sour cream Pizza Foods made with lard or butter Creamy soups or sauces Meat gravies Chocolate Oils such as palm and coconut oil Skin of chicken or [place] High-fiber and gas-producing foods can also cause some people discomfort after gallbladder surgery, so you may want to introduce them slowly back into your diet.  These include:   Cereals Whole-grain breads Nuts Seeds Legumes Brussels sprouts Broccoli Cauliflower Cabbage Spicy foods may also cause some gastrointestinal symptoms for a short time after gallbladder removal.

## 2016-01-04 NOTE — Telephone Encounter (Signed)
Patient Name: Kelsey Carlson  DOB: [birth year]    Initial Comment caller states she has sharp abd pain and nausea    Nurse Assessment  Nurse: Mallie Mussel, RN, Alveta Heimlich Date/Time Eilene Ghazi Time): 01/04/2016 11:44:03 AM  Confirm and document reason for call. If symptomatic, describe symptoms. You must click the next button to save text entered. ---Caller states that she has upper abdominal pain which began Tuesday. She rates her pain as 5 on 0-10 scale at present, but gets worse with eating. The pain is constant and has been constant for more than 4 hours. She denies vomiting and diarrhea. Denies fever.  Has the patient traveled out of the country within the last 30 days? ---No  Does the patient have any new or worsening symptoms? ---Yes  Will a triage be completed? ---Yes  Related visit to physician within the last 2 weeks? ---No  Does the PT have any chronic conditions? (i.e. diabetes, asthma, etc.) ---No  Is the patient pregnant or possibly pregnant? (Ask all females between the ages of 86-55) ---No  Is this a behavioral health or substance abuse call? ---No     Guidelines    Guideline Title Affirmed Question Affirmed Notes  Abdominal Pain - Upper [1] MILD-MODERATE pain AND [2] constant AND [3] present > 2 hours    Final Disposition User   See Physician within 4 Hours (or PCP triage) Mallie Mussel, RN, [place]    Comments  Dr. Diona Browner is not in today. No appointments at [facility]. She states that she lives between [place] and [place]. I was able to schedule her to be seen by Humberto Seals FNP at [facility] today at 2:15pm. She verbalized understanding.   Referrals  REFERRED TO PCP OFFICE   Disagree/Comply: Comply

## 2016-01-04 NOTE — Telephone Encounter (Signed)
Pt has appt to see Dorothyann Peng NP08/17/17 at 2:15.

## 2016-01-04 NOTE — Progress Notes (Signed)
Subjective:    Patient ID: Catarina Hartshorn, female    DOB: May 04, 1966, 50 y.o.   MRN: [id number]  HPI  Ms Reisdorf is a 50 year old female who presents today with a chief complaint of epigastric pain with radiaiton through back and to the RUQ. The pain is described as a " pressure like pain with episodes of shooting pain." Her symptoms include " severe" abdominal pain, especially after eating, nausea without vomiting and abdominal distension. She also reports fatigue and loss of appetite. She has had multiple instances of this pain in the past, states " I always have been given nausea medication and sent home."   She denies constipation, diarrhea or blood in stool.   She does not take prilosec daily, " only when I feel like I am going to have heart burn."    Review of Systems  Constitutional: Positive for activity change, appetite change and fatigue. Negative for chills, diaphoresis and unexpected weight change.  Respiratory: Negative.   Cardiovascular: Negative.   Gastrointestinal: Positive for abdominal distention, abdominal pain and nausea. Negative for anal bleeding, blood in stool, constipation, diarrhea, rectal pain and vomiting.  Genitourinary: Negative.   All other systems reviewed and are negative.  Past Medical History:  Diagnosis Date  . Nephrolithiasis   . Urolithiasis   . Wears glasses     Social History   Social History  . Marital status: Married    Spouse name: N/A  . Number of children: N/A  . Years of education: N/A   Occupational History  .  [employer] History Main Topics  . Smoking status: Former Smoker    Quit date: 05/20/1994  . Smokeless tobacco: Never Used  . Alcohol use Yes     Comment: socially  . Drug use: No  . Sexual activity: Yes    Birth control/ protection: Surgical     Comment: [facility]   Other Topics Concern  . Not on file   Social History Narrative   Regular exercise- yes 3 x a week      Diet-  fruits and veggies    Past Surgical History:  Procedure Laterality Date  . 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 BLADDER SUSPENSION  2008   sling-cysto  . breast biopsy  2002   lt br bx  . BREAST CYST EXCISION Left 02/19/2013   Procedure:  EXCISION BREAST MASS;  Surgeon: Marcello Moores A. Cornett, MD;  Location: [facility];  Service: General;  Laterality: Left;  . BREAST SURGERY    . DILATION AND CURETTAGE OF UTERUS    . LUMBAR DISC SURGERY  1997   lumb lam  . TUBAL LIGATION     [facility]    Family History  Problem Relation Age of Onset  . Cancer Mother 33    breast  . Hypertension Father     No Known Allergies  Current Outpatient Prescriptions on File Prior to Visit  Medication Sig Dispense Refill  . acetaminophen (TYLENOL) 325 MG tablet Take 650 mg by mouth every 6 (six) hours as needed.    Marland Kitchen omeprazole (PRILOSEC) 20 MG capsule Take 1 capsule (20 mg total) by mouth daily. 30 capsule 3   No current facility-administered medications on file prior to visit.     BP 102/60   Temp 98.1 F (36.7 C) (Oral)   Ht 5\' 4"  (1.626 m)   Wt 187 lb (84.8  kg)   BMI 32.10 kg/m       Objective:   Physical Exam  Constitutional: She is oriented to person, place, and time. She appears well-developed and well-nourished. No distress.  Cardiovascular: Normal rate, regular rhythm, normal heart sounds and intact distal pulses.  Exam reveals no gallop and no friction rub.   No murmur heard. Pulmonary/Chest: Effort normal and breath sounds normal. No respiratory distress. She has no wheezes. She has no rales. She exhibits no tenderness.  Abdominal: Soft. Normal appearance and bowel sounds are normal. She exhibits no mass. There is hepatomegaly. There is no hepatosplenomegaly or splenomegaly. There is tenderness in the right upper quadrant and epigastric area. There is no rigidity, no rebound, no guarding, no tenderness at McBurney's point and negative Murphy's sign.    Neurological: She is alert and oriented to person, place, and time.  Skin: Skin is warm and dry. No rash noted. She is not diaphoretic. No erythema. No pallor.  Psychiatric: She has a normal mood and affect. Her behavior is normal. Judgment and thought content normal.  Nursing note and vitals reviewed.     Assessment & Plan:  1. Abdominal pain, epigastric - Need to r/o  cholecystitis, gallstones, pancreatitis, and h.pylori infection.  - CBC with Differential/Platelet - H. pylori antibody, IgG - Lipase - Comprehensive metabolic panel - Urinalysis, Routine w reflex microscopic (not at [facility]) - EKG 12-Lead- NSR, Rate 90 - US Abdomen Limited RUQ; Future  Dorothyann Peng, NP

## 2016-01-08 ENCOUNTER — Telehealth: Payer: Self-pay | Admitting: Family Medicine

## 2016-01-08 NOTE — Telephone Encounter (Signed)
[facility] is calling to let Kelsey Carlson know the pt no longer needs ultrasound stat. Pt was called by [facility] and is doing better. Pt will kept the ultrasound appt and if she continue to do better will cancel

## 2016-01-12 ENCOUNTER — Emergency Department (HOSPITAL_COMMUNITY)
Admission: EM | Admit: 2016-01-12 | Discharge: 2016-01-12 | Disposition: A | Discharge status: Home / Self Care | Payer: Managed Care, Other (non HMO) | Attending: Emergency Medicine | Admitting: Emergency Medicine

## 2016-01-12 ENCOUNTER — Encounter (HOSPITAL_COMMUNITY): Payer: Self-pay

## 2016-01-12 DIAGNOSIS — Z87891 Personal history of nicotine dependence: Secondary | ICD-10-CM | POA: Insufficient documentation

## 2016-01-12 DIAGNOSIS — Z79899 Other long term (current) drug therapy: Secondary | ICD-10-CM | POA: Diagnosis not present

## 2016-01-12 DIAGNOSIS — K0889 Other specified disorders of teeth and supporting structures: Secondary | ICD-10-CM | POA: Diagnosis present

## 2016-01-12 DIAGNOSIS — K047 Periapical abscess without sinus: Secondary | ICD-10-CM | POA: Insufficient documentation

## 2016-01-12 NOTE — ED Triage Notes (Signed)
Per pT, Pt is coming from home with complaints of dental pain that started on Monday. Pt saw dentist and was sent home with pain medication and antibiotics. Pt reports no change and pain/swelling worsening.

## 2016-01-12 NOTE — Discharge Instructions (Signed)
Please continue using clindamycin, previously prescribed pain medication as needed. Please follow-up with oral surgeon as previously scheduled. Please return to the emergency room or your dentist if you experience any new or worsening signs or symptoms.

## 2016-01-12 NOTE — ED Notes (Signed)
Waiting for discharge instructions. 

## 2016-01-12 NOTE — ED Provider Notes (Signed)
[facility] DEPT Provider Note   CSN: [id number] Arrival date & time: 01/12/16  [id number]   By signing my name below, I, Shanna Cisco, attest that this documentation has been prepared under the direction and in the presence of [employer], PA-C. Electronically signed by: Shanna Cisco, ED Scribe. 01/12/16. 9:48 AM.   History   Chief Complaint Chief Complaint  Patient presents with  . Oral Swelling   The history is provided by the patient. No language interpreter was used.   HPI Comments:  Kelsey Carlson is a 50 y.o. female who presents to the Emergency Department complaining of gradually worsening right lower dental pain, which started 4 days ago. Pt reports that she saw the dentist and was prescribed pain medication and antibiotics. She was given a referral to an oral surgeon for a root canal. Associated symptoms per pt include facial swelling, difficultly eating and swallowing and abscess in mouth. Pt has taken Motrin, Tylenol and Hydrocodone, with little relief. Denies fever.   Past Medical History:  Diagnosis Date  . Nephrolithiasis   . Urolithiasis   . Wears glasses     Patient Active Problem List   Diagnosis Date Noted  . BMI 33.0-33.9,adult 12/08/2014  . Varicose veins of both legs with edema 12/08/2014  . Peripheral edema 12/08/2014  . Lumbar back pain 06/28/2013  . Insomnia 02/18/2012  . Depression 01/16/2012  . TRANSAMINASES, SERUM, ELEVATED 05/27/2008  . NEPHROLITHIASIS, HX OF 05/23/2008  . SLEEP APNEA 05/30/2007  . NECK PAIN, CHRONIC 02/25/2007    Past Surgical History:  Procedure Laterality Date  . 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 BLADDER SUSPENSION  2008   sling-cysto  . breast biopsy  2002   lt br bx  . BREAST CYST EXCISION Left 02/19/2013   Procedure:  EXCISION BREAST MASS;  Surgeon: Marcello Moores A. Cornett, MD;  Location: [facility];  Service: General;  Laterality: Left;  . BREAST SURGERY    . DILATION AND CURETTAGE OF  UTERUS    . LUMBAR DISC SURGERY  1997   lumb lam  . TUBAL LIGATION     [facility]    OB History    Gravida Para Term Preterm AB Living   3 3           SAB TAB Ectopic Multiple Live Births                   Home Medications    Prior to Admission medications   Medication Sig Start Date End Date Taking? Authorizing Provider  acetaminophen (TYLENOL) 325 MG tablet Take 650 mg by mouth every 6 (six) hours as needed.   Yes Historical Provider, MD  clindamycin (CLEOCIN) 150 MG capsule Take 150 mg by mouth 4 (four) times daily. For 10 days 01/09/16  Yes Historical Provider, MD  HYDROcodone-acetaminophen (NORCO) 7.5-325 MG tablet Take 1 tablet by mouth every 6 (six) hours as needed for moderate pain.   Yes Historical Provider, MD  ibuprofen (ADVIL,MOTRIN) 800 MG tablet Take 800 mg by mouth every 8 (eight) hours as needed for moderate pain.   Yes Historical Provider, MD  omeprazole (PRILOSEC) 20 MG capsule Take 1 capsule (20 mg total) by mouth daily. Patient taking differently: Take 20 mg by mouth daily as needed (for acid reflux).  09/23/14  Yes Pleas Koch, NP    Family History Family History  Problem Relation Age of Onset  . Cancer Mother 31    breast  .  Hypertension Father     Social History Social History  Substance Use Topics  . Smoking status: Former Smoker    Quit date: 05/20/1994  . Smokeless tobacco: Never Used  . Alcohol use Yes     Comment: socially     Allergies   Review of patient's allergies indicates no known allergies.   Review of Systems Review of Systems  Constitutional: Negative for fever.  HENT: Positive for dental problem, facial swelling and trouble swallowing.   All other systems reviewed and are negative.    Physical Exam Updated Vital Signs BP 125/77 (BP Location: Left Arm)   Pulse 88   Temp 98.4 F (36.9 C) (Oral)   Resp 14   Ht 5\' 4"  (1.626 m)   Wt 83.9 kg   SpO2 99%   BMI 31.76 kg/m   Physical Exam  Constitutional: She is  oriented to person, place, and time. She appears well-developed and well-nourished. No distress.  HENT:  Head: Normocephalic.  Mouth/Throat: Uvula is midline, oropharynx is clear and moist and mucous membranes are normal. No oropharyngeal exudate, posterior oropharyngeal edema, posterior oropharyngeal erythema or tonsillar abscesses.  Obvious swelling to right jaw. Internal exam shows area of fluctuance over the first molar in right lower jaw. Floor of the mouth is soft.  Small amount of blood coming from first molar.  Full active range of motion of the jaw. Neck is supple with full active range of motion, no tenderness to palpation of the soft tissues  Posterior oropharynx clear with no signs of infection, uvula is midline and rises with phonation, tonsils present and normal in size, symmetrical bilateral, tongue is normal soft touch with full active range of motion, floor mouth is soft nontender.  Eyes: Conjunctivae are normal. Pupils are equal, round, and reactive to light. Right eye exhibits no discharge. Left eye exhibits no discharge.  Neck: Normal range of motion. Neck supple. No JVD present. No tracheal deviation present. No thyromegaly present.  Pulmonary/Chest: No stridor.  Lymphadenopathy:    She has no cervical adenopathy.  Neurological: She is alert and oriented to person, place, and time.  Skin: Skin is warm and dry. No rash noted. She is not diaphoretic. No erythema. No pallor.  Psychiatric: She has a normal mood and affect. Her behavior is normal. Judgment and thought content normal.  Nursing note and vitals reviewed.    ED Treatments / Results  DIAGNOSTIC STUDIES:  Oxygen Saturation is 99% on room air, normal by my interpretation.    COORDINATION OF CARE:  9:09 AM Discussed treatment plan with pt at bedside, which includes a dental block, and pt agreed to plan. Advised pt to follow up with oral surgeon to proceed with root canal.   Labs (all labs ordered are listed,  but only abnormal results are displayed) Labs Reviewed - No data to display  EKG  EKG Interpretation None       Radiology No results found.  Procedures Procedures (including critical care time)  Medications Ordered in ED Medications - No data to display   Initial Impression / Assessment and Plan / ED Course  I have reviewed the triage vital signs and the nursing notes.  Pertinent labs & imaging results that were available during my care of the patient were reviewed by me and considered in my medical decision making (see chart for details).  Clinical Course    Labs:   Imaging:   Consults:   Therapeutics:   Discharge Meds:  Assessment/Plan: She presents  with abscess. Originally she had fluctuance, after palpation of the abscess drainage noted, this was purulent with blood. Patient notes that she did have some draining prior to arrival. Abscess reduced here no more fluctuance, she does have surrounding inflammation. She has no concerning signs of deep space infection, systemic illness.   Continue using Clindamycin and previously prescribed pain medication for pain. Pt has a follow up appointment with oral surgeon in 6 days. She's instructed to return to er or dentist if new or worsening symptoms emerge. Pt verbalized understanding and agreement to today's plan and had no further questions.   Final Clinical Impressions(s) / ED Diagnoses   Final diagnoses:  Dental abscess    New Prescriptions New Prescriptions   No medications on file  I personally performed the services described in this documentation, which was scribed in my presence. The recorded information has been reviewed and is accurate.     Okey Regal, PA-C 01/12/16 [id number]    Julianne Rice, MD 01/17/16 [phone]

## 2016-01-15 ENCOUNTER — Other Ambulatory Visit: Payer: Self-pay

## 2016-05-09 ENCOUNTER — Encounter: Payer: Self-pay | Admitting: Family Medicine

## 2016-05-09 ENCOUNTER — Ambulatory Visit (INDEPENDENT_AMBULATORY_CARE_PROVIDER_SITE_OTHER): Payer: Managed Care, Other (non HMO) | Admitting: Family Medicine

## 2016-05-09 VITALS — BP 120/88 | HR 69 | Wt 186.0 lb

## 2016-05-09 DIAGNOSIS — M62838 Other muscle spasm: Secondary | ICD-10-CM

## 2016-05-09 MED ORDER — CYCLOBENZAPRINE HCL 5 MG PO TABS: 5.0000 mg | ORAL_TABLET | Freq: Every day | ORAL | 1 refills | Status: DC

## 2016-05-09 NOTE — Progress Notes (Signed)
Dr. Frederico Hamman T. Jerriyah Louis, MD, [facility] [place], [address] Phone: [phone] Fax: [phone]  05/09/2016  Patient: Kelsey Carlson, MRN: [id number], DOB: 1965/07/14, 50 y.o.  Primary Physician:  Eliezer Lofts, MD   Chief Complaint  Patient presents with  . Back Pain   Subjective:   Derrika Koshy is a 50 y.o. very pleasant female patient who presents with the following:  L sided shoulder blade pain Daughter can feel a knot - spreading up her neck.  Taking some advil and hurting some.  sshort-term at a desk, she is having some pain in her middle trap and rhomboid region.  There is been some muscle spasm and tightness, and her daughters been trying to give her a massage to relieve this.  She also is been taking some NSAIDs.  Past Medical History, Surgical History, Social History, Family History, Problem List, Medications, and Allergies have been reviewed and updated if relevant.  Patient Active Problem List   Diagnosis Date Noted  . BMI 33.0-33.9,adult 12/08/2014  . Varicose veins of both legs with edema 12/08/2014  . Peripheral edema 12/08/2014  . Lumbar back pain 06/28/2013  . Insomnia 02/18/2012  . Depression 01/16/2012  . TRANSAMINASES, SERUM, ELEVATED 05/27/2008  . NEPHROLITHIASIS, HX OF 05/23/2008  . SLEEP APNEA 05/30/2007  . NECK PAIN, CHRONIC 02/25/2007    Past Medical History:  Diagnosis Date  . Nephrolithiasis   . Urolithiasis   . Wears glasses     Past Surgical History:  Procedure Laterality Date  . 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 BLADDER SUSPENSION  2008   sling-cysto  . breast biopsy  2002   lt br bx  . BREAST CYST EXCISION Left 02/19/2013   Procedure:  EXCISION BREAST MASS;  Surgeon: Marcello Moores A. Cornett, MD;  Location: [facility];  Service: General;  Laterality: Left;  . BREAST SURGERY    . DILATION AND CURETTAGE OF UTERUS    . LUMBAR DISC SURGERY   1997   lumb lam  . TUBAL LIGATION     [facility]    Social History   Social History  . Marital status: Married    Spouse name: N/A  . Number of children: N/A  . Years of education: N/A   Occupational History  .  [employer] History Main Topics  . Smoking status: Former Smoker    Quit date: 05/20/1994  . Smokeless tobacco: Never Used  . Alcohol use Yes     Comment: socially  . Drug use: No  . Sexual activity: Yes    Birth control/ protection: Surgical     Comment: [facility] B[facility]   Other Topics Concern  . Not on file   Social History Narrative   Regular exercise- yes 3 x a week      Diet- fruits and veggies    Family History  Problem Relation Age of Onset  . Cancer Mother 29    breast  . Hypertension Father     No Known Allergies  Medication list reviewed and updated in full in [facility].  GEN: no acute illness or fever CV: No chest pain or shortness of breath MSK: detailed above Neuro: neurological signs are described above ROS O/w per HPI  Objective:   BP 120/88   Pulse 69   Wt 186 lb (84.4 kg)   SpO2 96%  BMI 31.93 kg/m    GEN: Well-developed,well-nourished,in no acute distress; alert,appropriate and cooperative throughout examination HEENT: Normocephalic and atraumatic without obvious abnormalities. Ears, externally no deformities PULM: Breathing comfortably in no respiratory distress EXT: No clubbing, cyanosis, or edema PSYCH: Normally interactive. Cooperative during the interview. Pleasant. Friendly and conversant. Not anxious or depressed appearing. Normal, full affect.  CERVICAL SPINE EXAM Range of motion: Flexion, extension, lateral bending, and rotation: minimal loss Pain with terminal motion: mild Spinous Processes: NT SCM: NT Upper paracervical muscles: minimal Upper traps: upper - mid on the l ttp C5-T1 intact, sensation and motor   Radiology: No results found.  Assessment and Plan:   Trapezius  muscle spasm  rreviewed basic rehabilitation with her.  Likely this will resolve on its own in the next 1-2 weeks  Follow-up: No Follow-up on file.  Meds ordered this encounter  Medications  . cyclobenzaprine (FLEXERIL) 5 MG tablet    Sig: Take 1 tablet (5 mg total) by mouth at bedtime.    Dispense:  30 tablet    Refill:  1   There are no discontinued medications. No orders of the defined types were placed in this encounter.   Signed,  Maud Deed. Germani Gavilanes, MD   Allergies as of 05/09/2016   No Known Allergies     Medication List       Accurate as of 05/09/16 11:59 PM. Always use your most recent med list.          acetaminophen 325 MG tablet Commonly known as:  TYLENOL Take 650 mg by mouth every 6 (six) hours as needed.   clindamycin 150 MG capsule Commonly known as:  CLEOCIN Take 150 mg by mouth 4 (four) times daily. For 10 days   cyclobenzaprine 5 MG tablet Commonly known as:  FLEXERIL Take 1 tablet (5 mg total) by mouth at bedtime.   HYDROcodone-acetaminophen 7.5-325 MG tablet Commonly known as:  NORCO Take 1 tablet by mouth every 6 (six) hours as needed for moderate pain.   ibuprofen 800 MG tablet Commonly known as:  ADVIL,MOTRIN Take 800 mg by mouth every 8 (eight) hours as needed for moderate pain.   omeprazole 20 MG capsule Commonly known as:  PRILOSEC Take 1 capsule (20 mg total) by mouth daily.

## 2016-12-17 ENCOUNTER — Ambulatory Visit (INDEPENDENT_AMBULATORY_CARE_PROVIDER_SITE_OTHER): Payer: 59 | Admitting: Family Medicine

## 2016-12-17 ENCOUNTER — Encounter: Payer: Self-pay | Admitting: Family Medicine

## 2016-12-17 DIAGNOSIS — R51 Headache: Secondary | ICD-10-CM | POA: Diagnosis not present

## 2016-12-17 DIAGNOSIS — R519 Headache, unspecified: Secondary | ICD-10-CM | POA: Insufficient documentation

## 2016-12-17 NOTE — Assessment & Plan Note (Signed)
Not associated with new med or head injury.  No red flags.  Not consistent with migrain.  Possible sinus pressure headache vs tension headache.  Use ibuprofen prn, start nasal steroid spray and antihistamine.

## 2016-12-17 NOTE — Progress Notes (Signed)
Subjective:    Patient ID: Kelsey Carlson, female    DOB: Apr 04, 1966, 51 y.o.   MRN: [id number]  HPI    51 year old female presents for new onset headaches.  She reports  In last few weeks she has been having more frequent headaches.  Daily headache, pressure in face.  Decreased appetite. No N/V. No photophonophobia.  No fall or head injury. No congestion, no post nasal drip, no sneeze.  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  Tried muscle relaxant. No help. Improves at time... ibuprofen 800 mg helps at times.   No new medications.  Blood pressure 103/70, pulse 66, temperature 98.4 F (36.9 C), temperature source Oral, height 5\' 4"  (1.626 m), weight 187 lb 8 oz (85 kg), last menstrual period 11/29/2016.   Review of Systems  Constitutional: Negative for fatigue and fever.  HENT: Negative for congestion.   Eyes: Negative for pain.  Respiratory: Negative for cough and shortness of breath.   Cardiovascular: Negative for chest pain, palpitations and leg swelling.  Gastrointestinal: Negative for abdominal pain.  Genitourinary: Negative for dysuria and vaginal bleeding.  Musculoskeletal: Positive for arthralgias and back pain.       Bilateral hip pain  Neurological: Negative for syncope, light-headedness and headaches.       Did notice tingling in right for for a time.  Psychiatric/Behavioral: Negative for dysphoric mood.       Objective:   Physical Exam  Constitutional: She is oriented to person, place, and time. Vital signs are normal. She appears well-developed and well-nourished. She is cooperative.  Non-toxic appearance. She does not appear ill. No distress.  HENT:  Head: Normocephalic.  Right Ear: Hearing, tympanic membrane, external ear and ear canal normal. Tympanic membrane is not erythematous, not retracted and not bulging.  Left Ear: Hearing, tympanic membrane, external ear and ear canal normal. Tympanic membrane is not erythematous, not retracted  and not bulging.  Nose: Mucosal edema and rhinorrhea present. Right sinus exhibits no maxillary sinus tenderness and no frontal sinus tenderness. Left sinus exhibits no maxillary sinus tenderness and no frontal sinus tenderness.  Mouth/Throat: Uvula is midline, oropharynx is clear and moist and mucous membranes are normal.  Eyes: Pupils are equal, round, and reactive to light. Conjunctivae, EOM and lids are normal. Lids are everted and swept, no foreign bodies found.  Neck: Trachea normal and normal range of motion. Neck supple. Carotid bruit is not present. No thyroid mass and no thyromegaly present.  Cardiovascular: Normal rate, regular rhythm, S1 normal, S2 normal, normal heart sounds, intact distal pulses and normal pulses.  Exam reveals no gallop and no friction rub.   No murmur heard. Pulmonary/Chest: Effort normal and breath sounds normal. No tachypnea. No respiratory distress. She has no decreased breath sounds. She has no wheezes. She has no rhonchi. She has no rales.  Abdominal: Soft. Normal appearance and bowel sounds are normal. There is no tenderness.  Neurological: She is alert and oriented to person, place, and time. She has normal strength and normal reflexes. No cranial nerve deficit or sensory deficit. She exhibits normal muscle tone. She displays a negative Romberg sign. Coordination and gait normal. GCS eye subscore is 4. GCS verbal subscore is 5. GCS motor subscore is 6.  Nml cerebellar exam   No papilledema  Skin: Skin is warm, dry and intact. No rash noted.  Psychiatric: She has a normal mood and affect. Her speech is normal and behavior is normal. Judgment and thought content normal.  Her mood appears not anxious. Cognition and memory are normal. Cognition and memory are not impaired. She does not exhibit a depressed mood. She exhibits normal recent memory and normal remote memory.          Assessment & Plan:

## 2016-12-17 NOTE — Patient Instructions (Addendum)
Start nasal steroid spray 2 sprays per nostril daily.  Consider starting zyrtec at night.  Can use ibuprofen ad needed for headache.  Call if not improving as expected.  Work on healthy lifestyle... adequate sleep, water etc.

## 2017-01-24 LAB — HM MAMMOGRAPHY

## 2017-07-03 ENCOUNTER — Encounter: Payer: Self-pay | Admitting: *Deleted

## 2017-07-03 ENCOUNTER — Encounter: Payer: Self-pay | Admitting: Family Medicine

## 2017-07-03 ENCOUNTER — Ambulatory Visit (INDEPENDENT_AMBULATORY_CARE_PROVIDER_SITE_OTHER): Payer: 59 | Admitting: Family Medicine

## 2017-07-03 ENCOUNTER — Other Ambulatory Visit: Payer: Self-pay

## 2017-07-03 VITALS — BP 100/72 | HR 74 | Temp 98.5°F | Ht 64.0 in | Wt 191.8 lb

## 2017-07-03 DIAGNOSIS — R1013 Epigastric pain: Secondary | ICD-10-CM

## 2017-07-03 DIAGNOSIS — R1011 Right upper quadrant pain: Secondary | ICD-10-CM | POA: Diagnosis not present

## 2017-07-03 LAB — CBC WITH DIFFERENTIAL/PLATELET
BASOS ABS: 0 10*3/uL (ref 0.0–0.1)
BASOS PCT: 0.5 % (ref 0.0–3.0)
EOS PCT: 2.9 % (ref 0.0–5.0)
Eosinophils Absolute: 0.3 10*3/uL (ref 0.0–0.7)
HEMATOCRIT: 43.6 % (ref 36.0–46.0)
Hemoglobin: 14.9 g/dL (ref 12.0–15.0)
LYMPHS ABS: 1.9 10*3/uL (ref 0.7–4.0)
LYMPHS PCT: 18.7 % (ref 12.0–46.0)
MCHC: 34.1 g/dL (ref 30.0–36.0)
MCV: 91.4 fl (ref 78.0–100.0)
Monocytes Absolute: 1 10*3/uL (ref 0.1–1.0)
Monocytes Relative: 9.8 % (ref 3.0–12.0)
NEUTROS PCT: 68.1 % (ref 43.0–77.0)
Neutro Abs: 6.9 10*3/uL (ref 1.4–7.7)
Platelets: 397 10*3/uL (ref 150.0–400.0)
RBC: 4.77 Mil/uL (ref 3.87–5.11)
RDW: 12.5 % (ref 11.5–15.5)
WBC: 10.1 10*3/uL (ref 4.0–10.5)

## 2017-07-03 LAB — BASIC METABOLIC PANEL
BUN: 10 mg/dL (ref 6–23)
CALCIUM: 9.3 mg/dL (ref 8.4–10.5)
CHLORIDE: 103 meq/L (ref 96–112)
CO2: 30 mEq/L (ref 19–32)
Creatinine, Ser: 0.83 mg/dL (ref 0.40–1.20)
GFR: 77.08 mL/min (ref >60.00)
Glucose, Bld: 82 mg/dL (ref 70–99)
Potassium: 4.1 mEq/L (ref 3.5–5.1)
Sodium: 139 mEq/L (ref 135–145)

## 2017-07-03 LAB — HEPATIC FUNCTION PANEL
ALT: 14 U/L (ref 0–35)
AST: 15 U/L (ref 0–37)
Albumin: 4.1 g/dL (ref 3.5–5.2)
Alkaline Phosphatase: 64 U/L (ref 39–117)
BILIRUBIN TOTAL: 0.4 mg/dL (ref 0.2–1.2)
Bilirubin, Direct: 0.1 mg/dL (ref 0.0–0.3)
Total Protein: 7.2 g/dL (ref 6.0–8.3)

## 2017-07-03 LAB — H. PYLORI ANTIBODY, IGG: H PYLORI IGG: NEGATIVE

## 2017-07-03 LAB — LIPASE: Lipase: 22 U/L (ref 11.0–59.0)

## 2017-07-03 NOTE — Progress Notes (Signed)
Dr. Frederico Hamman T. Emmarose Klinke, MD, [facility] [place], [address] Phone: [phone] Fax: [phone]  07/03/2017  Patient: Kelsey Carlson, MRN: [id number], DOB: [birth year], 52 y.o.  Primary Physician:  Jinny Sanders, MD   Chief Complaint  Patient presents with  . Abdominal Pain   Subjective:   Kelsey Carlson is a 52 y.o. very pleasant female patient who presents with the following:  Pain, upper abdomen. ? Gallbladder.   Pleasant lady who reports a history of gallstones in the past who presents with right upper quadrant plain and to a lesser extent epigastric pain.  This is been worsening over the last few days.  She does not have a fever, she is eating and drinking normally.  She is having normal bowel movements, she is urinating normally.  She is not vomiting.  No significant nausea.  No melena, no hematochezia.  No bloody urine.  Past Medical History, Surgical History, Social History, Family History, Problem List, Medications, and Allergies have been reviewed and updated if relevant.  Patient Active Problem List   Diagnosis Date Noted  . Acute headache 12/17/2016  . BMI 33.0-33.9,adult 12/08/2014  . Varicose veins of both legs with edema 12/08/2014  . Peripheral edema 12/08/2014  . Lumbar back pain 06/28/2013  . Insomnia 02/18/2012  . Depression 01/16/2012  . TRANSAMINASES, SERUM, ELEVATED 05/27/2008  . NEPHROLITHIASIS, HX OF 05/23/2008  . SLEEP APNEA 05/30/2007  . NECK PAIN, CHRONIC 02/25/2007    Past Medical History:  Diagnosis Date  . Nephrolithiasis   . Urolithiasis   . Wears glasses     Past Surgical History:  Procedure Laterality Date  . 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 BLADDER SUSPENSION  2008   sling-cysto  . breast biopsy  2002   lt br bx  . BREAST CYST EXCISION Left 02/19/2013   Procedure:  EXCISION BREAST MASS;  Surgeon: Marcello Moores A. Cornett, MD;  Location: [facility];  Service: General;  Laterality: Left;  . BREAST SURGERY    . DILATION AND CURETTAGE OF UTERUS    . LUMBAR DISC SURGERY  1997   lumb lam  . TUBAL LIGATION     [facility]    Social History   Socioeconomic History  . Marital status: Married    Spouse name: Not on file  . Number of children: Not on file  . Years of education: Not on file  . Highest education level: Not on file  Social Needs  . Financial resource strain: Not on file  . Food insecurity - worry: Not on file  . Food insecurity - inability: Not on file  . Transportation needs - medical: Not on file  . Transportation needs - non-medical: Not on file  Occupational History    Employer: [employer]    Comment: Mailroom  Tobacco Use  . Smoking status: Former Smoker    Last attempt to quit: 05/20/1994    Years since quitting: 23.1  . Smokeless tobacco: Never Used  Substance and Sexual Activity  . Alcohol use: Yes    Comment: socially  . Drug use: No  . Sexual activity: Yes    Birth control/protection: Surgical    Comment: [facility] BTL  Other Topics Concern  . Not on file  Social History Narrative   Regular exercise- yes 3 x a week      Diet- fruits and veggies    Family History  Problem Relation Age of Onset  . Cancer Mother 51       breast  . Hypertension Father     No Known Allergies  Medication list reviewed and updated in full in [facility].   GEN: No acute illnesses, no fevers, chills. GI: No n/v/d, eating normally Pulm: No SOB Interactive and getting along well at home.  Otherwise, ROS is as per the HPI.  Objective:   BP 100/72   Pulse 74   Temp 98.5 F (36.9 C) (Oral)   Ht 5\' 4"  (1.626 m)   Wt 191 lb 12 oz (87 kg)   LMP 06/28/2017   BMI 32.91 kg/m   GEN: WDWN, NAD, Non-toxic, A & O x 3 HEENT: Atraumatic, Normocephalic. Neck supple. No masses, No LAD. Ears and Nose: No external deformity. CV: RRR, No M/G/R. No JVD. No thrill. No extra heart sounds. PULM: CTA B,  no wheezes, crackles, rhonchi. No retractions. No resp. distress. No accessory muscle use. ABD: S, RUQ and epigastric mild-mod tenderness, ND, + BS, No rebound, No HSM  EXTR: No c/c/e NEURO Normal gait.  PSYCH: Normally interactive. Conversant. Not depressed or anxious appearing.  Calm demeanor.   Laboratory and Imaging Data: Results for orders placed or performed in visit on 07/03/17  CBC with Differential/Platelet  Result Value Ref Range   WBC 10.1 4.0 - 10.5 K/uL   RBC 4.77 3.87 - 5.11 Mil/uL   Hemoglobin 14.9 12.0 - 15.0 g/dL   HCT 43.6 36.0 - 46.0 %   MCV 91.4 78.0 - 100.0 fl   MCHC 34.1 30.0 - 36.0 g/dL   RDW 12.5 11.5 - 15.5 %   Platelets 397.0 150.0 - 400.0 K/uL   Neutrophils Relative % 68.1 43.0 - 77.0 %   Lymphocytes Relative 18.7 12.0 - 46.0 %   Monocytes Relative 9.8 3.0 - 12.0 %   Eosinophils Relative 2.9 0.0 - 5.0 %   Basophils Relative 0.5 0.0 - 3.0 %   Neutro Abs 6.9 1.4 - 7.7 K/uL   Lymphs Abs 1.9 0.7 - 4.0 K/uL   Monocytes Absolute 1.0 0.1 - 1.0 K/uL   Eosinophils Absolute 0.3 0.0 - 0.7 K/uL   Basophils Absolute 0.0 0.0 - 0.1 K/uL  Basic metabolic panel  Result Value Ref Range   Sodium 139 135 - 145 mEq/L   Potassium 4.1 3.5 - 5.1 mEq/L   Chloride 103 96 - 112 mEq/L   CO2 30 19 - 32 mEq/L   Glucose, Bld 82 70 - 99 mg/dL   BUN 10 6 - 23 mg/dL   Creatinine, Ser 0.83 0.40 - 1.20 mg/dL   Calcium 9.3 8.4 - 10.5 mg/dL   GFR 77.08 >60.00 mL/min  Hepatic function panel  Result Value Ref Range   Total Bilirubin 0.4 0.2 - 1.2 mg/dL   Bilirubin, Direct 0.1 0.0 - 0.3 mg/dL   Alkaline Phosphatase 64 39 - 117 U/L   AST 15 0 - 37 U/L   ALT 14 0 - 35 U/L   Total Protein 7.2 6.0 - 8.3 g/dL   Albumin 4.1 3.5 - 5.2 g/dL  H. pylori antibody, IgG  Result Value Ref Range   H Pylori IgG Negative Negative  Lipase  Result Value Ref Range   Lipase 22.0 11.0 - 59.0 U/L     Assessment and Plan:   Right upper quadrant pain - Plan: CBC with Differential/Platelet, Basic  metabolic panel, Hepatic function panel, H. pylori antibody, IgG, Lipase, US Abdomen Limited RUQ  Epigastric pain - Plan: H. pylori antibody, IgG  Right upper quadrant pain in a 52 year old female, most suggestive of gallbladder disease.  Check a right upper quadrant ultrasound to evaluate for such.  At this point all laboratories have returned and are very reassuring.  Follow-up: No Follow-up on file.  Medications Discontinued During This Encounter  Medication Reason  . cyclobenzaprine (FLEXERIL) 5 MG tablet Completed Course  . omeprazole (PRILOSEC) 20 MG capsule Completed Course   Orders Placed This Encounter  Procedures  . US Abdomen Limited RUQ  . CBC with Differential/Platelet  . Basic metabolic panel  . Hepatic function panel  . H. pylori antibody, IgG  . Lipase    Signed,  Coty Larsh T. Hades Mathew, MD   Allergies as of 07/03/2017   No Known Allergies     Medication List        Accurate as of 07/03/17  6:37 PM. Always use your most recent med list.          acetaminophen 325 MG tablet Commonly known as:  TYLENOL Take 650 mg by mouth every 6 (six) hours as needed.   ibuprofen 800 MG tablet Commonly known as:  ADVIL,MOTRIN Take 800 mg by mouth every 8 (eight) hours as needed for moderate pain.

## 2017-07-03 NOTE — Patient Instructions (Signed)

## 2017-07-08 ENCOUNTER — Ambulatory Visit
Admission: RE | Admit: 2017-07-08 | Discharge: 2017-07-08 | Disposition: A | Discharge status: Home / Self Care | Payer: 59 | Source: Ambulatory Visit | Attending: Family Medicine | Admitting: Family Medicine

## 2017-07-08 ENCOUNTER — Other Ambulatory Visit: Payer: Self-pay | Admitting: Family Medicine

## 2017-07-08 DIAGNOSIS — K8020 Calculus of gallbladder without cholecystitis without obstruction: Secondary | ICD-10-CM | POA: Diagnosis not present

## 2017-07-08 DIAGNOSIS — K8000 Calculus of gallbladder with acute cholecystitis without obstruction: Secondary | ICD-10-CM

## 2017-07-08 DIAGNOSIS — R1011 Right upper quadrant pain: Secondary | ICD-10-CM | POA: Diagnosis present

## 2017-07-16 ENCOUNTER — Ambulatory Visit: Payer: Self-pay | Admitting: General Surgery

## 2017-07-16 DIAGNOSIS — K8020 Calculus of gallbladder without cholecystitis without obstruction: Secondary | ICD-10-CM | POA: Diagnosis not present

## 2017-08-18 NOTE — Pre-Procedure Instructions (Signed)
Kelsey Carlson  08/18/2017      [id number] - [name], [name] [name]  [address] Phone: [phone] Fax: [phone]    Your procedure is scheduled on Wednesday April 10.  Report to [facility] Admitting at 6:30 A.M.  Call this number if you have problems the morning of surgery:  [phone]   Remember:  Do not eat food or drink liquids after midnight.  Take these medicines the morning of surgery with A SIP OF WATER: NONE  7 days prior to surgery STOP taking any Aspirin(unless otherwise instructed by your surgeon), Aleve, Naproxen, Ibuprofen, Motrin, Advil, Goody's, BC's, all herbal medications, fish oil, and all vitamins    Do not wear jewelry, make-up or nail polish.  Do not wear lotions, powders, or perfumes, or deodorant.  Do not shave 48 hours prior to surgery.  Men may shave face and neck.  Do not bring valuables to the hospital.  [facility] is not responsible for any belongings or valuables.  Contacts, dentures or bridgework may not be worn into surgery.  Leave your suitcase in the car.  After surgery it may be brought to your room.  For patients admitted to the hospital, discharge time will be determined by your treatment team.  Patients discharged the day of surgery will not be allowed to drive home.    Special instructions:    [facility]- Preparing For Surgery  Before surgery, you can play an important role. Because skin is not sterile, your skin needs to be as free of germs as possible. You can reduce the number of germs on your skin by washing with CHG (chlorahexidine gluconate) Soap before surgery.  CHG is an antiseptic cleaner which kills germs and bonds with the skin to continue killing germs even after washing.  Please do not use if you have an allergy to CHG or antibacterial soaps. If your skin becomes reddened/irritated stop using the CHG.  Do not shave (including legs and underarms) for at  least 48 hours prior to first CHG shower. It is OK to shave your face.  Please follow these instructions carefully.   1. Shower the NIGHT BEFORE SURGERY and the MORNING OF SURGERY with CHG.   2. If you chose to wash your hair, wash your hair first as usual with your normal shampoo.  3. After you shampoo, rinse your hair and body thoroughly to remove the shampoo.  4. Use CHG as you would any other liquid soap. You can apply CHG directly to the skin and wash gently with a scrungie or a clean washcloth.   5. Apply the CHG Soap to your body ONLY FROM THE NECK DOWN.  Do not use on open wounds or open sores. Avoid contact with your eyes, ears, mouth and genitals (private parts). Wash Face and genitals (private parts)  with your normal soap.  6. Wash thoroughly, paying special attention to the area where your surgery will be performed.  7. Thoroughly rinse your body with warm water from the neck down.  8. DO NOT shower/wash with your normal soap after using and rinsing off the CHG Soap.  9. Pat yourself dry with a CLEAN TOWEL.  10. Wear CLEAN PAJAMAS to bed the night before surgery, wear comfortable clothes the morning of surgery  11. Place CLEAN SHEETS on your bed the night of your first shower and DO NOT SLEEP WITH PETS.    Day of Surgery: Do not apply any  deodorants/lotions. Please wear clean clothes to the hospital/surgery center.      Please read over the following fact sheets that you were given. Coughing and Deep Breathing and Surgical Site Infection Prevention

## 2017-08-19 ENCOUNTER — Encounter (HOSPITAL_COMMUNITY)
Admission: RE | Admit: 2017-08-19 | Discharge: 2017-08-19 | Disposition: A | Discharge status: Home / Self Care | Payer: 59 | Source: Ambulatory Visit | Attending: General Surgery | Admitting: General Surgery

## 2017-08-19 ENCOUNTER — Other Ambulatory Visit: Payer: Self-pay

## 2017-08-19 ENCOUNTER — Encounter (HOSPITAL_COMMUNITY): Payer: Self-pay

## 2017-08-19 DIAGNOSIS — Z01812 Encounter for preprocedural laboratory examination: Secondary | ICD-10-CM | POA: Insufficient documentation

## 2017-08-19 HISTORY — DX: Personal history of urinary calculi: Z87.442

## 2017-08-19 HISTORY — DX: Calculus of gallbladder without cholecystitis without obstruction: K80.20

## 2017-08-19 HISTORY — DX: Unspecified osteoarthritis, unspecified site: M19.90

## 2017-08-19 LAB — CBC
HCT: 43.3 % (ref 36.0–46.0)
Hemoglobin: 14.6 g/dL (ref 12.0–15.0)
MCH: 31.1 pg (ref 26.0–34.0)
MCHC: 33.7 g/dL (ref 30.0–36.0)
MCV: 92.3 fL (ref 78.0–100.0)
PLATELETS: 361 10*3/uL (ref 150–400)
RBC: 4.69 MIL/uL (ref 3.87–5.11)
RDW: 12.5 % (ref 11.5–15.5)
WBC: 7.3 10*3/uL (ref 4.0–10.5)

## 2017-08-19 MED ORDER — CHLORHEXIDINE GLUCONATE CLOTH 2 % EX PADS: 6.0000 | MEDICATED_PAD | Freq: Once | CUTANEOUS | Status: DC

## 2017-08-19 NOTE — Progress Notes (Addendum)
Danell Vazquez            08/18/2017                          [id number] - [name], [place] [address] [name]  [address] Phone: [phone] Fax: [phone]              Your procedure is scheduled on Wednesday April 10.            Report to [facility] Admitting at 6:30 A.M.            Call this number if you have problems the morning of surgery:            [phone]             Remember:            Do not eat food or drink liquids after midnight.            Take these medicines the morning of surgery with A SIP OF WATER: NONE  Please complete your PRE-SURGERY ENSURE that was given to before you leave your house the morning of surgery.  Please, if able, drink it in one setting. DO NOT SIP.  7 days prior to surgery STOP taking any Aspirin(unless otherwise instructed by your surgeon), Aleve, Naproxen, Ibuprofen, Motrin, Advil, Goody's, BC's, all herbal medications, fish oil, and all vitamins              Do not wear jewelry, make-up or nail polish.            Do not wear lotions, powders, or perfumes, or deodorant.            Do not shave 48 hours prior to surgery.              Do not bring valuables to the hospital.            [facility] is not responsible for any belongings or valuables.  Contacts, dentures or bridgework may not be worn into surgery.  Leave your suitcase in the car.  After surgery it may be brought to your room.  For patients admitted to the hospital, discharge time will be determined by your treatment team.  Patients discharged the day of surgery will not be allowed to drive home.    Special instructions:    [facility]- Preparing For Surgery  Before surgery, you can play an important role. Because skin is not sterile, your skin needs to be as free of germs as possible. You can reduce the number of germs on your skin by washing with CHG (chlorahexidine gluconate) Soap before surgery.  CHG  is an antiseptic cleaner which kills germs and bonds with the skin to continue killing germs even after washing.  Please do not use if you have an allergy to CHG or antibacterial soaps. If your skin becomes reddened/irritated stop using the CHG.  Do not shave (including legs and underarms) for at least 48 hours prior to first CHG shower. It is OK to shave your face.  Please follow these instructions carefully.  1. Shower the NIGHT BEFORE SURGERY and the MORNING OF SURGERY with CHG.   2. If you chose to wash your hair, wash your hair first as usual with your normal shampoo.  3. After you shampoo, rinse your hair and body thoroughly to remove the shampoo.  4. Use CHG as you would any other liquid soap. You can apply CHG directly to the skin and wash gently with a scrungie or a clean washcloth.   5. Apply the CHG Soap to your body ONLY FROM THE NECK DOWN.  Do not use on open wounds or open sores. Avoid contact with your eyes, ears, mouth and genitals (private parts). Wash Face and genitals (private parts)  with your normal soap.  6. Wash thoroughly, paying special attention to the area where your surgery will be performed.  7. Thoroughly rinse your body with warm water from the neck down.  8. DO NOT shower/wash with your normal soap after using and rinsing off the CHG Soap.  9. Pat yourself dry with a CLEAN TOWEL.  10. Wear CLEAN PAJAMAS to bed the night before surgery, wear comfortable clothes the morning of surgery  11. Place CLEAN SHEETS on your bed the night of your first shower and DO NOT SLEEP WITH PETS.    Day of Surgery: Do not apply any deodorants/lotions. Please wear clean clothes to the hospital/surgery center.      Please read over the following fact sheets that you were given. Coughing and Deep Breathing and Surgical Site Infection  Prevention

## 2017-08-19 NOTE — Progress Notes (Addendum)
PCP: Eliezer Lofts, MD  Cardiologist: pt denies  EKG: pt denies past year  Stress test: pt denies ever  ECHO: pt denies ever  Cardiac Cath: pt denies ever  Chest x-ray: pt denies past year, no recent respiratory infections/complications

## 2017-08-27 ENCOUNTER — Ambulatory Visit (HOSPITAL_COMMUNITY): Payer: 59 | Admitting: Anesthesiology

## 2017-08-27 ENCOUNTER — Encounter (HOSPITAL_COMMUNITY): Payer: Self-pay | Admitting: Anesthesiology

## 2017-08-27 ENCOUNTER — Other Ambulatory Visit (HOSPITAL_COMMUNITY): Payer: Self-pay

## 2017-08-27 ENCOUNTER — Encounter (HOSPITAL_COMMUNITY)
Admission: RE | Disposition: A | Discharge status: Home / Self Care | Payer: Self-pay | Source: Ambulatory Visit | Attending: General Surgery

## 2017-08-27 ENCOUNTER — Ambulatory Visit (HOSPITAL_COMMUNITY): Payer: 59

## 2017-08-27 ENCOUNTER — Ambulatory Visit (HOSPITAL_COMMUNITY)
Admission: RE | Admit: 2017-08-27 | Discharge: 2017-08-27 | Disposition: A | Discharge status: Home / Self Care | Payer: 59 | Source: Ambulatory Visit | Attending: General Surgery | Admitting: General Surgery

## 2017-08-27 DIAGNOSIS — Z820 Family history of epilepsy and other diseases of the nervous system: Secondary | ICD-10-CM | POA: Diagnosis not present

## 2017-08-27 DIAGNOSIS — K219 Gastro-esophageal reflux disease without esophagitis: Secondary | ICD-10-CM | POA: Diagnosis not present

## 2017-08-27 DIAGNOSIS — F329 Major depressive disorder, single episode, unspecified: Secondary | ICD-10-CM | POA: Diagnosis not present

## 2017-08-27 DIAGNOSIS — Z419 Encounter for procedure for purposes other than remedying health state, unspecified: Secondary | ICD-10-CM

## 2017-08-27 DIAGNOSIS — G4730 Sleep apnea, unspecified: Secondary | ICD-10-CM | POA: Diagnosis not present

## 2017-08-27 DIAGNOSIS — Z87442 Personal history of urinary calculi: Secondary | ICD-10-CM | POA: Diagnosis not present

## 2017-08-27 DIAGNOSIS — Z808 Family history of malignant neoplasm of other organs or systems: Secondary | ICD-10-CM | POA: Insufficient documentation

## 2017-08-27 DIAGNOSIS — Z8719 Personal history of other diseases of the digestive system: Secondary | ICD-10-CM | POA: Diagnosis not present

## 2017-08-27 DIAGNOSIS — K8010 Calculus of gallbladder with chronic cholecystitis without obstruction: Secondary | ICD-10-CM | POA: Diagnosis not present

## 2017-08-27 DIAGNOSIS — K8020 Calculus of gallbladder without cholecystitis without obstruction: Secondary | ICD-10-CM | POA: Diagnosis not present

## 2017-08-27 DIAGNOSIS — Z8349 Family history of other endocrine, nutritional and metabolic diseases: Secondary | ICD-10-CM | POA: Insufficient documentation

## 2017-08-27 DIAGNOSIS — G43909 Migraine, unspecified, not intractable, without status migrainosus: Secondary | ICD-10-CM | POA: Insufficient documentation

## 2017-08-27 DIAGNOSIS — Z8042 Family history of malignant neoplasm of prostate: Secondary | ICD-10-CM | POA: Insufficient documentation

## 2017-08-27 DIAGNOSIS — Z803 Family history of malignant neoplasm of breast: Secondary | ICD-10-CM | POA: Diagnosis not present

## 2017-08-27 DIAGNOSIS — F17200 Nicotine dependence, unspecified, uncomplicated: Secondary | ICD-10-CM | POA: Insufficient documentation

## 2017-08-27 DIAGNOSIS — G4700 Insomnia, unspecified: Secondary | ICD-10-CM | POA: Diagnosis not present

## 2017-08-27 DIAGNOSIS — K824 Cholesterolosis of gallbladder: Secondary | ICD-10-CM | POA: Diagnosis not present

## 2017-08-27 HISTORY — PX: CHOLECYSTECTOMY: SHX55

## 2017-08-27 LAB — POCT PREGNANCY, URINE: PREG TEST UR: NEGATIVE

## 2017-08-27 SURGERY — LAPAROSCOPIC CHOLECYSTECTOMY WITH INTRAOPERATIVE CHOLANGIOGRAM
Anesthesia: General | Site: Abdomen

## 2017-08-27 MED ORDER — MIDAZOLAM HCL 2 MG/2ML IJ SOLN
INTRAMUSCULAR | Status: AC
  Filled 2017-08-27: qty 2

## 2017-08-27 MED ORDER — ROCURONIUM BROMIDE 10 MG/ML (PF) SYRINGE
PREFILLED_SYRINGE | INTRAVENOUS | Status: AC
  Filled 2017-08-27: qty 5

## 2017-08-27 MED ORDER — CEFAZOLIN SODIUM-DEXTROSE 2-4 GM/100ML-% IV SOLN
2.0000 g | INTRAVENOUS | Status: AC
  Administered 2017-08-27: 2 g via INTRAVENOUS
  Filled 2017-08-27: qty 100

## 2017-08-27 MED ORDER — ONDANSETRON HCL 4 MG/2ML IJ SOLN: 4.0000 mg | Freq: Once | INTRAMUSCULAR | Status: DC | PRN

## 2017-08-27 MED ORDER — 0.9 % SODIUM CHLORIDE (POUR BTL) OPTIME
TOPICAL | Status: DC | PRN
  Administered 2017-08-27: 1000 mL

## 2017-08-27 MED ORDER — SODIUM CHLORIDE 0.9 % IV SOLN
INTRAVENOUS | Status: DC | PRN
  Administered 2017-08-27: 09:00:00

## 2017-08-27 MED ORDER — FENTANYL CITRATE (PF) 250 MCG/5ML IJ SOLN
INTRAMUSCULAR | Status: AC
  Filled 2017-08-27: qty 5

## 2017-08-27 MED ORDER — HYDROCODONE-ACETAMINOPHEN 5-325 MG PO TABS: 1.0000 | ORAL_TABLET | Freq: Four times a day (QID) | ORAL | 0 refills | Status: DC | PRN

## 2017-08-27 MED ORDER — SUGAMMADEX SODIUM 200 MG/2ML IV SOLN
INTRAVENOUS | Status: DC | PRN
  Administered 2017-08-27: 200 mg via INTRAVENOUS

## 2017-08-27 MED ORDER — ONDANSETRON HCL 4 MG/2ML IJ SOLN
INTRAMUSCULAR | Status: DC | PRN
  Administered 2017-08-27: 4 mg via INTRAVENOUS

## 2017-08-27 MED ORDER — MIDAZOLAM HCL 5 MG/5ML IJ SOLN
INTRAMUSCULAR | Status: DC | PRN
  Administered 2017-08-27 (×2): 1 mg via INTRAVENOUS

## 2017-08-27 MED ORDER — EPHEDRINE 5 MG/ML INJ
INTRAVENOUS | Status: AC
  Filled 2017-08-27: qty 10

## 2017-08-27 MED ORDER — LIDOCAINE 2% (20 MG/ML) 5 ML SYRINGE
INTRAMUSCULAR | Status: AC
  Filled 2017-08-27: qty 5

## 2017-08-27 MED ORDER — HYDROMORPHONE HCL 1 MG/ML IJ SOLN: 0.2500 mg | INTRAMUSCULAR | Status: DC | PRN

## 2017-08-27 MED ORDER — LIDOCAINE HCL (CARDIAC) 20 MG/ML IV SOLN
INTRAVENOUS | Status: DC | PRN
  Administered 2017-08-27: 100 mg via INTRAVENOUS

## 2017-08-27 MED ORDER — ONDANSETRON HCL 4 MG/2ML IJ SOLN
INTRAMUSCULAR | Status: AC
  Filled 2017-08-27: qty 2

## 2017-08-27 MED ORDER — IOPAMIDOL (ISOVUE-300) INJECTION 61%
INTRAVENOUS | Status: AC
  Filled 2017-08-27: qty 50

## 2017-08-27 MED ORDER — GABAPENTIN 300 MG PO CAPS
300.0000 mg | ORAL_CAPSULE | ORAL | Status: AC
  Administered 2017-08-27: 300 mg via ORAL
  Filled 2017-08-27: qty 1

## 2017-08-27 MED ORDER — FENTANYL CITRATE (PF) 100 MCG/2ML IJ SOLN
INTRAMUSCULAR | Status: DC | PRN
  Administered 2017-08-27: 100 ug via INTRAVENOUS
  Administered 2017-08-27: 50 ug via INTRAVENOUS

## 2017-08-27 MED ORDER — PROPOFOL 10 MG/ML IV BOLUS
INTRAVENOUS | Status: AC
  Filled 2017-08-27: qty 20

## 2017-08-27 MED ORDER — LACTATED RINGERS IV SOLN
INTRAVENOUS | Status: DC | PRN
  Administered 2017-08-27 (×2): via INTRAVENOUS

## 2017-08-27 MED ORDER — DEXAMETHASONE SODIUM PHOSPHATE 10 MG/ML IJ SOLN
INTRAMUSCULAR | Status: DC | PRN
  Administered 2017-08-27: 10 mg via INTRAVENOUS

## 2017-08-27 MED ORDER — ROCURONIUM BROMIDE 100 MG/10ML IV SOLN
INTRAVENOUS | Status: DC | PRN
  Administered 2017-08-27: 100 mg via INTRAVENOUS

## 2017-08-27 MED ORDER — ACETAMINOPHEN 500 MG PO TABS
1000.0000 mg | ORAL_TABLET | ORAL | Status: AC
  Administered 2017-08-27: 1000 mg via ORAL
  Filled 2017-08-27: qty 2

## 2017-08-27 MED ORDER — PROPOFOL 10 MG/ML IV BOLUS
INTRAVENOUS | Status: AC
  Filled 2017-08-27: qty 40

## 2017-08-27 MED ORDER — PROPOFOL 10 MG/ML IV BOLUS
INTRAVENOUS | Status: DC | PRN
  Administered 2017-08-27: 100 mg via INTRAVENOUS
  Administered 2017-08-27: 20 mg via INTRAVENOUS

## 2017-08-27 MED ORDER — PHENYLEPHRINE 40 MCG/ML (10ML) SYRINGE FOR IV PUSH (FOR BLOOD PRESSURE SUPPORT)
PREFILLED_SYRINGE | INTRAVENOUS | Status: AC
  Filled 2017-08-27: qty 10

## 2017-08-27 MED ORDER — BUPIVACAINE-EPINEPHRINE (PF) 0.25% -1:200000 IJ SOLN
INTRAMUSCULAR | Status: AC
  Filled 2017-08-27: qty 30

## 2017-08-27 MED ORDER — SODIUM CHLORIDE 0.9 % IR SOLN
Status: DC | PRN
  Administered 2017-08-27: 1

## 2017-08-27 MED ORDER — BUPIVACAINE-EPINEPHRINE 0.25% -1:200000 IJ SOLN
INTRAMUSCULAR | Status: DC | PRN
  Administered 2017-08-27: 25 mL

## 2017-08-27 MED ORDER — PHENYLEPHRINE HCL 10 MG/ML IJ SOLN
INTRAMUSCULAR | Status: DC | PRN
  Administered 2017-08-27 (×2): 80 ug via INTRAVENOUS

## 2017-08-27 MED ORDER — SUGAMMADEX SODIUM 200 MG/2ML IV SOLN
INTRAVENOUS | Status: AC
  Filled 2017-08-27: qty 2

## 2017-08-27 MED ORDER — CELECOXIB 200 MG PO CAPS
200.0000 mg | ORAL_CAPSULE | ORAL | Status: AC
  Administered 2017-08-27: 200 mg via ORAL
  Filled 2017-08-27: qty 1

## 2017-08-27 MED ORDER — EPHEDRINE SULFATE 50 MG/ML IJ SOLN
INTRAMUSCULAR | Status: DC | PRN
  Administered 2017-08-27: 5 mg via INTRAVENOUS
  Administered 2017-08-27: 10 mg via INTRAVENOUS

## 2017-08-27 MED ORDER — DEXAMETHASONE SODIUM PHOSPHATE 10 MG/ML IJ SOLN
INTRAMUSCULAR | Status: AC
  Filled 2017-08-27: qty 1

## 2017-08-27 MED ORDER — MEPERIDINE HCL 50 MG/ML IJ SOLN: 6.2500 mg | INTRAMUSCULAR | Status: DC | PRN

## 2017-08-27 SURGICAL SUPPLY — 44 items
ADH SKN CLS APL DERMABOND .7 (GAUZE/BANDAGES/DRESSINGS) ×1
APPLIER CLIP 5 13 M/L LIGAMAX5 (MISCELLANEOUS) ×3
APR CLP MED LRG 5 ANG JAW (MISCELLANEOUS) ×1
BAG SPEC RTRVL LRG 6X4 10 (ENDOMECHANICALS) ×1
BLADE CLIPPER SURG (BLADE) ×3 IMPLANT
CANISTER SUCT 3000ML PPV (MISCELLANEOUS) ×3 IMPLANT
CATH REDDICK CHOLANGI 4FR 50CM (CATHETERS) ×6 IMPLANT
CHLORAPREP W/TINT 26ML (MISCELLANEOUS) ×3 IMPLANT
CLIP APPLIE 5 13 M/L LIGAMAX5 (MISCELLANEOUS) ×1 IMPLANT
COVER MAYO STAND STRL (DRAPES) ×3 IMPLANT
COVER SURGICAL LIGHT HANDLE (MISCELLANEOUS) ×3 IMPLANT
DERMABOND ADVANCED (GAUZE/BANDAGES/DRESSINGS) ×2
DERMABOND ADVANCED .7 DNX12 (GAUZE/BANDAGES/DRESSINGS) ×1 IMPLANT
DRAPE C-ARM 42X72 X-RAY (DRAPES) ×3 IMPLANT
ELECT REM PT RETURN 9FT ADLT (ELECTROSURGICAL) ×3
ELECTRODE REM PT RTRN 9FT ADLT (ELECTROSURGICAL) ×1 IMPLANT
GLOVE BIO SURGEON STRL SZ7.5 (GLOVE) ×3 IMPLANT
GLOVE BIOGEL M STER SZ 6 (GLOVE) ×6 IMPLANT
GLOVE BIOGEL PI IND STRL 7.0 (GLOVE) ×2 IMPLANT
GLOVE BIOGEL PI IND STRL 7.5 (GLOVE) ×2 IMPLANT
GLOVE BIOGEL PI IND STRL 8 (GLOVE) ×2 IMPLANT
GLOVE BIOGEL PI INDICATOR 7.0 (GLOVE) ×4
GLOVE BIOGEL PI INDICATOR 7.5 (GLOVE) ×4
GLOVE BIOGEL PI INDICATOR 8 (GLOVE) ×4
GOWN STRL REUS W/ TWL LRG LVL3 (GOWN DISPOSABLE) ×3 IMPLANT
GOWN STRL REUS W/TWL LRG LVL3 (GOWN DISPOSABLE) ×9
IV CATH 14GX2 1/4 (CATHETERS) ×3 IMPLANT
KIT BASIN OR (CUSTOM PROCEDURE TRAY) ×3 IMPLANT
KIT TURNOVER KIT B (KITS) ×3 IMPLANT
NS IRRIG 1000ML POUR BTL (IV SOLUTION) ×3 IMPLANT
PAD ARMBOARD 7.5X6 YLW CONV (MISCELLANEOUS) ×3 IMPLANT
POUCH SPECIMEN RETRIEVAL 10MM (ENDOMECHANICALS) ×3 IMPLANT
SCISSORS LAP 5X35 DISP (ENDOMECHANICALS) ×3 IMPLANT
SET IRRIG TUBING LAPAROSCOPIC (IRRIGATION / IRRIGATOR) ×3 IMPLANT
SLEEVE ENDOPATH XCEL 5M (ENDOMECHANICALS) ×6 IMPLANT
SPECIMEN JAR SMALL (MISCELLANEOUS) ×3 IMPLANT
SUT MNCRL AB 4-0 PS2 18 (SUTURE) ×6 IMPLANT
TOWEL OR 17X24 6PK STRL BLUE (TOWEL DISPOSABLE) ×3 IMPLANT
TOWEL OR 17X26 10 PK STRL BLUE (TOWEL DISPOSABLE) ×3 IMPLANT
TRAY LAPAROSCOPIC MC (CUSTOM PROCEDURE TRAY) ×3 IMPLANT
TROCAR XCEL BLUNT TIP 100MML (ENDOMECHANICALS) ×3 IMPLANT
TROCAR XCEL NON-BLD 5MMX100MML (ENDOMECHANICALS) ×3 IMPLANT
TUBING INSUFFLATION (TUBING) ×3 IMPLANT
WATER STERILE IRR 1000ML POUR (IV SOLUTION) ×3 IMPLANT

## 2017-08-27 NOTE — Interval H&P Note (Signed)
History and Physical Interval Note:  08/27/2017 8:21 AM  Kelsey Carlson  has presented today for surgery, with the diagnosis of GALLSTONES  The various methods of treatment have been discussed with the patient and family. After consideration of risks, benefits and other options for treatment, the patient has consented to  Procedure(s): LAPAROSCOPIC CHOLECYSTECTOMY WITH INTRAOPERATIVE CHOLANGIOGRAM (N/A) as a surgical intervention .  The patient's history has been reviewed, patient examined, no change in status, stable for surgery.  I have reviewed the patient's chart and labs.  Questions were answered to the patient's satisfaction.     TOTH III,PAUL S

## 2017-08-27 NOTE — H&P (Signed)
Kelsey Carlson  Location: [facility] Surgery Patient #: [id number] DOB: 1965/07/13 Married / Language: English / Race: White Female   History of Present Illness The patient is a 52 year old female who presents with abdominal pain. We are asked to see the patient in consultation by Dr. Diona Browner to evaluate her for gallstones. The patient is a 52 year old white female who presents with epigastric pain. She has been expressing this pain for the last 25 years. Over the last year to the pain has become more frequent. She has had 3 episodes of pain over the last 2 months. Each episode of pain last 2-3 days. The pain is associated with nausea but no vomiting. She recently had an ultrasound performed that showed stones in the gallbladder but no gallbladder wall thickening or ductal dilatation. Her liver functions were normal. She does smoke.   Past Surgical History  Breast Mass; Local Excision  Left. Oral Surgery  Spinal Surgery - Lower Back   Diagnostic Studies History  Colonoscopy  1-5 years ago Mammogram  1-3 years ago Pap Smear  1-5 years ago  Allergies  No Known Drug Allergies  Allergies Reconciled   Medication History No Current Medications Medications Reconciled  Social History  Alcohol use  Occasional alcohol use. Caffeine use  Coffee. Tobacco use  Current every day smoker.  Family History  Breast Cancer  Mother. Melanoma  Mother. Migraine Headache  Mother. Prostate Cancer  Father. Thyroid problems  Mother.  Pregnancy / Birth History  Age at menarche  26 years. Contraceptive History  Contraceptive implant. Gravida  3 Maternal age  66-25 Para  2 Regular periods   Other Problems  Back Pain  Cholelithiasis  Gastroesophageal Reflux Disease  Kidney Stone  Lump In Breast  Migraine Headache     Review of Systems  General Present- Night Sweats. Not Present- Appetite Loss, Chills, Fatigue, Fever, Weight Gain and Weight  Loss. Skin Not Present- Change in Wart/Mole, Dryness, Hives, Jaundice, New Lesions, Non-Healing Wounds, Rash and Ulcer. HEENT Present- Ringing in the Ears and Wears glasses/contact lenses. Not Present- Earache, Hearing Loss, Hoarseness, Nose Bleed, Oral Ulcers, Seasonal Allergies, Sinus Pain, Sore Throat, Visual Disturbances and Yellow Eyes. Respiratory Present- Snoring. Not Present- Bloody sputum, Chronic Cough, Difficulty Breathing and Wheezing. Cardiovascular Not Present- Chest Pain, Difficulty Breathing Lying Down, Leg Cramps, Palpitations, Rapid Heart Rate, Shortness of Breath and Swelling of Extremities. Musculoskeletal Present- Back Pain and Joint Pain. Not Present- Joint Stiffness, Muscle Pain, Muscle Weakness and Swelling of Extremities. Endocrine Not Present- Cold Intolerance, Excessive Hunger, Hair Changes, Heat Intolerance, Hot flashes and New Diabetes. Hematology Not Present- Blood Thinners, Easy Bruising, Excessive bleeding, Gland problems, HIV and Persistent Infections.  Vitals  Weight: 192.6 lb Height: 64in Body Surface Area: 1.92 m Body Mass Index: 33.06 kg/m  Temp.: 97.76F  Pulse: 97 (Regular)  BP: 116/74 (Sitting, Left Arm, Standard)       Physical Exam  General Mental Status-Alert. General Appearance-Consistent with stated age. Hydration-Well hydrated. Voice-Normal.  Head and Neck Head-normocephalic, atraumatic with no lesions or palpable masses. Trachea-midline. Thyroid Gland Characteristics - normal size and consistency.  Eye Eyeball - Bilateral-Extraocular movements intact. Sclera/Conjunctiva - Bilateral-No scleral icterus.  Chest and Lung Exam Chest and lung exam reveals -quiet, even and easy respiratory effort with no use of accessory muscles and on auscultation, normal breath sounds, no adventitious sounds and normal vocal resonance. Inspection Chest Wall - Normal. Back - normal.  Cardiovascular Cardiovascular  examination reveals -normal heart sounds, regular rate  and rhythm with no murmurs and normal pedal pulses bilaterally.  Abdomen Note: The abdomen is soft. There is mild epigastric tenderness. There is no palpable mass. There are no surgical scars along the upper abdomen   Neurologic Neurologic evaluation reveals -alert and oriented x 3 with no impairment of recent or remote memory. Mental Status-Normal.  Musculoskeletal Normal Exam - Left-Upper Extremity Strength Normal and Lower Extremity Strength Normal. Normal Exam - Right-Upper Extremity Strength Normal and Lower Extremity Strength Normal.  Lymphatic Head & Neck  General Head & Neck Lymphatics: Bilateral - Description - Normal. Axillary  General Axillary Region: Bilateral - Description - Normal. Tenderness - Non Tender. Femoral & Inguinal  Generalized Femoral & Inguinal Lymphatics: Bilateral - Description - Normal. Tenderness - Non Tender.    Assessment & Plan  GALLSTONES (K80.20) Impression: The patient appears to have symptomatic gallstones. Because of the risk of further painful episodes and possible pancreatitis and think she would benefit from having the gallbladder removed. She will also like to have this done. I have discussed with her in detail the risks and benefits of the operation as well as some of the technical aspects and she understands and wishes to proceed. I will plan for a laparoscopic cholecystectomy with intraoperative cholangiogram Current Plans Pt Education - Gallstones: discussed with patient and provided information.

## 2017-08-27 NOTE — Anesthesia Procedure Notes (Signed)
Procedure Name: Intubation Date/Time: 08/27/2017 8:43 AM Performed by: Scheryl Darter, CRNA Pre-anesthesia Checklist: Patient identified, Emergency Drugs available, Suction available and Patient being monitored Patient Re-evaluated:Patient Re-evaluated prior to induction Oxygen Delivery Method: Circle System Utilized Preoxygenation: Pre-oxygenation with 100% oxygen Induction Type: IV induction Ventilation: Mask ventilation without difficulty Laryngoscope Size: Miller and 2 Grade View: Grade I Tube type: Oral Tube size: 7.5 mm Number of attempts: 1 Airway Equipment and Method: Stylet and Oral airway Placement Confirmation: ETT inserted through vocal cords under direct vision,  positive ETCO2 and breath sounds checked- equal and bilateral Secured at: 22 cm Tube secured with: Tape Dental Injury: Teeth and Oropharynx as per pre-operative assessment

## 2017-08-27 NOTE — Op Note (Signed)
08/27/2017  9:56 AM  PATIENT:  Kelsey Carlson  52 y.o. female  PRE-OPERATIVE DIAGNOSIS:  GALLSTONES  POST-OPERATIVE DIAGNOSIS:  GALLSTONES  PROCEDURE:  Procedure(s): LAPAROSCOPIC CHOLECYSTECTOMY WITH INTRAOPERATIVE CHOLANGIOGRAM (N/A)  SURGEON:  Surgeon(s) and Role:    * Jovita Kussmaul, MD - Primary  PHYSICIAN ASSISTANT:   ASSISTANTS: Gillermo Murdoch, RNFA   ANESTHESIA:   local and general  EBL:  25 mL   BLOOD ADMINISTERED:none  DRAINS: none   LOCAL MEDICATIONS USED:  MARCAINE     SPECIMEN:  Source of Specimen:  gallbladder  DISPOSITION OF SPECIMEN:  PATHOLOGY  COUNTS:  YES  TOURNIQUET:  * No tourniquets in log *  DICTATION: .Dragon Dictation   Procedure: After informed consent was obtained the patient was brought to the operating room and placed in the supine position on the operating room table. After adequate induction of general anesthesia the patient's abdomen was prepped with ChloraPrep allowed to dry and draped in usual sterile manner. An appropriate timeout was performed. The area below the umbilicus was infiltrated with quarter percent  Marcaine. A small incision was made with a 15 blade knife. The incision was carried down through the subcutaneous tissue bluntly with a hemostat and [employer] retractors. The linea alba was identified. The linea alba was incised with a 15 blade knife and each side was grasped with Coker clamps. The preperitoneal space was then probed with a hemostat until the peritoneum was opened and access was gained to the abdominal cavity. A 0 Vicryl pursestring stitch was placed in the fascia surrounding the opening. A Hassan cannula was then placed through the opening and anchored in place with the previously placed Vicryl purse string stitch. The abdomen was insufflated with carbon dioxide without difficulty. A laparoscope was inserted through the [facility] cannula in the right upper quadrant was inspected. Next the epigastric region was  infiltrated with % Marcaine. A small incision was made with a 15 blade knife. A 5 mm port was placed bluntly through this incision into the abdominal cavity under direct vision. Next 2 sites were chosen laterally on the right side of the abdomen for placement of 5 mm ports. Each of these areas was infiltrated with quarter percent Marcaine. Small stab incisions were made with a 15 blade knife. 5 mm ports were then placed bluntly through these incisions into the abdominal cavity under direct vision without difficulty. A blunt grasper was placed through the lateralmost 5 mm port and used to grasp the dome of the gallbladder and elevated anteriorly and superiorly. Another blunt grasper was placed through the other 5 mm port and used to retract the body and neck of the gallbladder. A dissector was placed through the epigastric port and using the electrocautery the peritoneal reflection at the gallbladder neck was opened. Blunt dissection was then carried out in this area until the gallbladder neck-cystic duct junction was readily identified and a good window was created. A single clip was placed on the gallbladder neck. A small  ductotomy was made just below the clip with laparoscopic scissors. A 14-gauge Angiocath was then placed through the anterior abdominal wall under direct vision. A Reddick cholangiogram catheter was then placed through the Angiocath and flushed. The catheter was then placed in the cystic duct and anchored in place with a clip. A cholangiogram was obtained that showed no filling defects good emptying into the duodenum an adequate length on the cystic duct. The anchoring clip and catheters were then removed from the patient. 3 clips were  placed proximally on the cystic duct and the duct was divided between the 2 sets of clips. Posterior to this the cystic artery was identified and again dissected bluntly in a circumferential manner until a good window  was created. 2 clips were placed proximally  and one distally on the artery and the artery was divided between the 2 sets of clips. Next a laparoscopic hook cautery device was used to separate the gallbladder from the liver bed. Prior to completely detaching the gallbladder from the liver bed the liver bed was inspected and several small bleeding points were coagulated with the electrocautery until the area was completely hemostatic. The gallbladder was then detached the rest of it from the liver bed without difficulty. A laparoscopic bag was inserted through the hassan port. The laparoscope was moved to the epigastric port. The gallbladder was placed within the bag and the bag was sealed.  The bag with the gallbladder was then removed with the [facility] cannula through the infraumbilical port without difficulty. The fascial defect was then closed with the previously placed Vicryl pursestring stitch as well as with another figure-of-eight 0 Vicryl stitch. The liver bed was inspected again and found to be hemostatic. The abdomen was irrigated with copious amounts of saline until the effluent was clear. The ports were then removed under direct vision without difficulty and were found to be hemostatic. The gas was allowed to escape. The skin incisions were all closed with interrupted 4-0 Monocryl subcuticular stitches. Dermabond dressings were applied. The patient tolerated the procedure well. At the end of the case all needle sponge and instrument counts were correct. The patient was then awakened and taken to recovery in stable condition  PLAN OF CARE: Discharge to home after PACU  PATIENT DISPOSITION:  PACU - hemodynamically stable.   Delay start of Pharmacological VTE agent (>24hrs) due to surgical blood loss or risk of bleeding: not applicable

## 2017-08-27 NOTE — Progress Notes (Signed)
Urine preg negative

## 2017-08-27 NOTE — Transfer of Care (Signed)
Immediate Anesthesia Transfer of Care Note  Patient: Kelsey Carlson  Procedure(s) Performed: LAPAROSCOPIC CHOLECYSTECTOMY WITH INTRAOPERATIVE CHOLANGIOGRAM (N/A Abdomen)  Patient Location: PACU  Anesthesia Type:General  Level of Consciousness: awake, alert , oriented and sedated  Airway & Oxygen Therapy: Patient Spontanous Breathing and Patient connected to nasal cannula oxygen  Post-op Assessment: Report given to RN, Post -op Vital signs reviewed and stable and Patient moving all extremities  Post vital signs: Reviewed and stable  Last Vitals:  Vitals Value Taken Time  BP 126/71 08/27/2017 10:09 AM  Temp    Pulse 86 08/27/2017 10:11 AM  Resp 13 08/27/2017 10:11 AM  SpO2 99 % 08/27/2017 10:11 AM  Vitals shown include unvalidated device data.  Last Pain:  Vitals:   08/27/17 0707  TempSrc:   PainSc: 5          Complications: No apparent anesthesia complications

## 2017-08-27 NOTE — Anesthesia Postprocedure Evaluation (Signed)
Anesthesia Post Note  Patient: Kelsey Carlson  Procedure(s) Performed: LAPAROSCOPIC CHOLECYSTECTOMY WITH INTRAOPERATIVE CHOLANGIOGRAM (N/A Abdomen)     Patient location during evaluation: PACU Anesthesia Type: General Level of consciousness: awake and alert Pain management: pain level controlled Vital Signs Assessment: post-procedure vital signs reviewed and stable Respiratory status: spontaneous breathing, nonlabored ventilation, respiratory function stable and patient connected to nasal cannula oxygen Cardiovascular status: blood pressure returned to baseline and stable Postop Assessment: no apparent nausea or vomiting Anesthetic complications: no    Last Vitals:  Vitals:   08/27/17 1045 08/27/17 1055  BP: 112/69 125/79  Pulse: 81 (!) 106  Resp: 11 14  Temp:    SpO2: 97% 95%    Last Pain:  Vitals:   08/27/17 1055  TempSrc:   PainSc: 3                  [name] DAVID

## 2017-08-27 NOTE — Anesthesia Preprocedure Evaluation (Signed)
Anesthesia Evaluation  Patient identified by MRN, date of birth, ID band Patient awake    Reviewed: Allergy & Precautions, NPO status , Patient's Chart, lab work & pertinent test results  Airway Mallampati: I  TM Distance: >3 FB Neck ROM: Full    Dental   Pulmonary sleep apnea , former smoker,    Pulmonary exam normal        Cardiovascular Normal cardiovascular exam     Neuro/Psych Depression    GI/Hepatic   Endo/Other    Renal/GU      Musculoskeletal   Abdominal   Peds  Hematology   Anesthesia Other Findings   Reproductive/Obstetrics                             Anesthesia Physical Anesthesia Plan  ASA: II  Anesthesia Plan: General   Post-op Pain Management:    Induction: Intravenous  PONV Risk Score and Plan: 3 and Ondansetron, Dexamethasone and Midazolam  Airway Management Planned: Oral ETT  Additional Equipment:   Intra-op Plan:   Post-operative Plan: Extubation in OR  Informed Consent: I have reviewed the patients History and Physical, chart, labs and discussed the procedure including the risks, benefits and alternatives for the proposed anesthesia with the patient or authorized representative who has indicated his/her understanding and acceptance.     Plan Discussed with: CRNA and Surgeon  Anesthesia Plan Comments:         Anesthesia Quick Evaluation

## 2017-08-28 ENCOUNTER — Encounter (HOSPITAL_COMMUNITY): Payer: Self-pay | Admitting: General Surgery

## 2017-10-20 DIAGNOSIS — L538 Other specified erythematous conditions: Secondary | ICD-10-CM | POA: Diagnosis not present

## 2017-10-20 DIAGNOSIS — L820 Inflamed seborrheic keratosis: Secondary | ICD-10-CM | POA: Diagnosis not present

## 2017-10-20 DIAGNOSIS — D2271 Melanocytic nevi of right lower limb, including hip: Secondary | ICD-10-CM | POA: Diagnosis not present

## 2017-10-20 DIAGNOSIS — D2272 Melanocytic nevi of left lower limb, including hip: Secondary | ICD-10-CM | POA: Diagnosis not present

## 2017-10-20 DIAGNOSIS — D225 Melanocytic nevi of trunk: Secondary | ICD-10-CM | POA: Diagnosis not present

## 2017-12-02 ENCOUNTER — Ambulatory Visit (INDEPENDENT_AMBULATORY_CARE_PROVIDER_SITE_OTHER): Payer: 59 | Admitting: Family Medicine

## 2017-12-02 ENCOUNTER — Encounter: Payer: Self-pay | Admitting: Family Medicine

## 2017-12-02 ENCOUNTER — Ambulatory Visit (INDEPENDENT_AMBULATORY_CARE_PROVIDER_SITE_OTHER)
Admission: RE | Admit: 2017-12-02 | Discharge: 2017-12-02 | Disposition: A | Discharge status: Home / Self Care | Payer: 59 | Source: Ambulatory Visit | Attending: Family Medicine | Admitting: Family Medicine

## 2017-12-02 DIAGNOSIS — M25552 Pain in left hip: Secondary | ICD-10-CM

## 2017-12-02 DIAGNOSIS — G8929 Other chronic pain: Secondary | ICD-10-CM | POA: Insufficient documentation

## 2017-12-02 DIAGNOSIS — M545 Low back pain, unspecified: Secondary | ICD-10-CM

## 2017-12-02 DIAGNOSIS — M549 Dorsalgia, unspecified: Secondary | ICD-10-CM

## 2017-12-02 DIAGNOSIS — M25551 Pain in right hip: Secondary | ICD-10-CM | POA: Diagnosis not present

## 2017-12-02 DIAGNOSIS — M2550 Pain in unspecified joint: Secondary | ICD-10-CM | POA: Diagnosis not present

## 2017-12-02 LAB — SEDIMENTATION RATE: SED RATE: 18 mm/h (ref 0–30)

## 2017-12-02 LAB — C-REACTIVE PROTEIN: CRP: 0.4 mg/dL — ABNORMAL LOW (ref 0.5–20.0)

## 2017-12-02 MED ORDER — DICLOFENAC SODIUM 75 MG PO TBEC: 75.0000 mg | DELAYED_RELEASE_TABLET | Freq: Two times a day (BID) | ORAL | 0 refills | Status: DC

## 2017-12-02 NOTE — Assessment & Plan Note (Signed)
Eval with X-ray.  Some evidence of bursitis.. Start antiinflammatory and home PT for troch bursitis.

## 2017-12-02 NOTE — Assessment & Plan Note (Signed)
Most consistent with OA but given persistent multiple joint pain.. Will eval for autoimmune disease.

## 2017-12-02 NOTE — Progress Notes (Signed)
Subjective:    Patient ID: Kelsey Carlson, female    DOB: May 21, 1965, 52 y.o.   MRN: [id number]  HPI    52 year old female pt presents with 5-6 months of all over joint pain.   She reports pain in bilateral hips, knees, hands  And shoulders, occ ankle pain. No specific redness, no swelling seen. No new meds. No proceeding illness, or strep.  No recent tick bites but did have one in 07/2017. She feels more achy when she gets cold. Has noted intermittent swelling in feet in last few days.  No muscle pain. Pain is greatest in hips, right more than left... Pain lateral and anteriorly No rash. No PMH of psoriasis.   She has OTC cream, ibuprofen 800 mg off and on.   Family history: Father family with RA possibly,  Dad with OA.  Social History /Family History/Past Medical History reviewed in detail and updated in EMR if needed. Blood pressure 116/78, pulse 95, temperature 98.1 F (36.7 C), temperature source Oral, height 5\' 4"  (1.626 m), weight 194 lb 12 oz (88.3 kg), last menstrual period 11/27/2017, SpO2 97 %.     Review of Systems  Constitutional: Negative for fatigue and fever.  HENT: Negative for congestion.   Eyes: Negative for pain.  Respiratory: Negative for cough and shortness of breath.   Cardiovascular: Negative for chest pain, palpitations and leg swelling.  Gastrointestinal: Negative for abdominal pain.  Genitourinary: Negative for dysuria and vaginal bleeding.  Musculoskeletal: Negative for back pain.  Neurological: Negative for syncope, light-headedness and headaches.  Psychiatric/Behavioral: Negative for dysphoric mood.       Objective:   Physical Exam  Constitutional: Vital signs are normal. She appears well-developed and well-nourished. She is cooperative.  Non-toxic appearance. She does not appear ill. No distress.  HENT:  Head: Normocephalic.  Right Ear: Hearing, tympanic membrane, external ear and ear canal normal. Tympanic membrane is not  erythematous, not retracted and not bulging.  Left Ear: Hearing, tympanic membrane, external ear and ear canal normal. Tympanic membrane is not erythematous, not retracted and not bulging.  Nose: No mucosal edema or rhinorrhea. Right sinus exhibits no maxillary sinus tenderness and no frontal sinus tenderness. Left sinus exhibits no maxillary sinus tenderness and no frontal sinus tenderness.  Mouth/Throat: Uvula is midline, oropharynx is clear and moist and mucous membranes are normal.  Eyes: Pupils are equal, round, and reactive to light. Conjunctivae, EOM and lids are normal. Lids are everted and swept, no foreign bodies found.  Neck: Trachea normal and normal range of motion. Neck supple. Carotid bruit is not present. No thyroid mass and no thyromegaly present.  Cardiovascular: Normal rate, regular rhythm, S1 normal, S2 normal, normal heart sounds, intact distal pulses and normal pulses. Exam reveals no gallop and no friction rub.  No murmur heard. Pulmonary/Chest: Effort normal and breath sounds normal. No tachypnea. No respiratory distress. She has no decreased breath sounds. She has no wheezes. She has no rhonchi. She has no rales.  Abdominal: Soft. Normal appearance and bowel sounds are normal. There is no tenderness.  Musculoskeletal:       Right shoulder: She exhibits decreased range of motion and tenderness. She exhibits no bony tenderness and no deformity.       Left shoulder: She exhibits decreased range of motion and tenderness. She exhibits no deformity.       Right elbow: Normal.      Left elbow: Normal.       Right wrist: Normal.  Left wrist: Normal.       Right hip: She exhibits decreased range of motion and tenderness. She exhibits no bony tenderness.       Left hip: She exhibits decreased range of motion and tenderness.       Right knee: No tenderness found. No medial joint line, no lateral joint line, no MCL and no patellar tendon tenderness noted.       Left knee: She  exhibits normal range of motion. No tenderness found. No medial joint line, no lateral joint line, no MCL, no LCL and no patellar tendon tenderness noted.       Right ankle: Normal.       Left ankle: Normal.       Lumbar back: She exhibits decreased range of motion and tenderness. She exhibits no bony tenderness.       Right foot: Normal.       Left foot: Normal.  Crepitus bilateral knees   ttp over bilateral troch bursa and anterior hip  Neurological: She is alert.  Skin: Skin is warm, dry and intact. No rash noted.  Psychiatric: Her speech is normal and behavior is normal. Judgment and thought content normal. Her mood appears not anxious. Cognition and memory are normal. She does not exhibit a depressed mood.          Assessment & Plan:

## 2017-12-02 NOTE — Patient Instructions (Addendum)
We will call with X-ray.  Please stop at the lab to have labs drawn.  Stop ibuprofen.  Start diclofenac 75 mg BID x 2 weeks.  Start home PT for possible bursitis.  Start glucosamine 500 mg 1-3 times daily.

## 2017-12-02 NOTE — Assessment & Plan Note (Signed)
Start home PT. NSAIDs, heat.

## 2017-12-04 LAB — ANA: ANA: NEGATIVE

## 2017-12-04 LAB — RHEUMATOID FACTOR: Rhuematoid fact SerPl-aCnc: <14 IU/mL (ref <14)

## 2017-12-04 LAB — CYCLIC CITRUL PEPTIDE ANTIBODY, IGG

## 2017-12-05 ENCOUNTER — Telehealth: Payer: Self-pay | Admitting: Family Medicine

## 2017-12-05 NOTE — Telephone Encounter (Signed)
Copied from [place] [id number]. Topic: Quick Communication - Lab Results >> Dec 05, 2017 10:10 AM Pilar Grammes, CMA wrote: Called patient to inform them of lab results. When patient returns call, triage nurse may disclose results.   Patient called and states she is returning a missed call in reference to lab results CB# [phone]

## 2017-12-08 NOTE — Telephone Encounter (Signed)
Charted in result notes. 

## 2017-12-23 ENCOUNTER — Encounter: Payer: Self-pay | Admitting: Family Medicine

## 2017-12-23 ENCOUNTER — Ambulatory Visit (INDEPENDENT_AMBULATORY_CARE_PROVIDER_SITE_OTHER): Payer: 59 | Admitting: Family Medicine

## 2017-12-23 DIAGNOSIS — M25551 Pain in right hip: Secondary | ICD-10-CM | POA: Diagnosis not present

## 2017-12-23 DIAGNOSIS — M25552 Pain in left hip: Secondary | ICD-10-CM | POA: Diagnosis not present

## 2017-12-23 DIAGNOSIS — M2550 Pain in unspecified joint: Secondary | ICD-10-CM

## 2017-12-23 DIAGNOSIS — G8929 Other chronic pain: Secondary | ICD-10-CM | POA: Diagnosis not present

## 2017-12-23 MED ORDER — DICLOFENAC SODIUM 75 MG PO TBEC: 75.0000 mg | DELAYED_RELEASE_TABLET | Freq: Two times a day (BID) | ORAL | 0 refills | Status: DC

## 2017-12-23 NOTE — Progress Notes (Signed)
   Subjective:    Patient ID: Kelsey Carlson, female    DOB: May 25, 1965, 52 y.o.   MRN: [id number]  HPI    52 year old female pt presents for 2 week follow up joint pain. At last OV.Marland Kitchen Rheum work up negative.  Hip/pelvis X-ray:  IMPRESSION: No acute osseous injury of bilateral hips.  At last OV started home PT, diclofenac and glucosamine.  Diclofenac helped with pain, felt 100 % better after 1 week.. But caused stomach upset... Nausea and decrease appetite.  Was taking it with food.   Stopped the med .Marland Kitchen And was very active going up and down stairs given husband had surgery.   She is not interested in injction at this time.  Family history: Father family with RA possibly,  Dad with OA.  Social History /Family History/Past Medical History reviewed in detail and updated in EMR if needed. Blood pressure 130/80, pulse 72, temperature 98.4 F (36.9 C), temperature source Oral, height 5\' 4"  (1.626 m), weight 194 lb 8 oz (88.2 kg), last menstrual period 12/18/2017.   Review of Systems  Constitutional: Negative for fatigue and fever.  HENT: Negative for ear pain.   Eyes: Negative for pain.  Respiratory: Negative for chest tightness and shortness of breath.   Cardiovascular: Negative for chest pain, palpitations and leg swelling.  Gastrointestinal: Negative for abdominal pain.  Genitourinary: Negative for dysuria.       Objective:   Physical Exam  Constitutional: Vital signs are normal. She appears well-developed and well-nourished. She is cooperative.  Non-toxic appearance. She does not appear ill. No distress.  HENT:  Head: Normocephalic.  Right Ear: Hearing, tympanic membrane, external ear and ear canal normal. Tympanic membrane is not erythematous, not retracted and not bulging.  Left Ear: Hearing, tympanic membrane, external ear and ear canal normal. Tympanic membrane is not erythematous, not retracted and not bulging.  Nose: No mucosal edema or rhinorrhea. Right sinus exhibits  no maxillary sinus tenderness and no frontal sinus tenderness. Left sinus exhibits no maxillary sinus tenderness and no frontal sinus tenderness.  Mouth/Throat: Uvula is midline, oropharynx is clear and moist and mucous membranes are normal.  Eyes: Pupils are equal, round, and reactive to light. Conjunctivae, EOM and lids are normal. Lids are everted and swept, no foreign bodies found.  Neck: Trachea normal and normal range of motion. Neck supple. Carotid bruit is not present. No thyroid mass and no thyromegaly present.  Cardiovascular: Normal rate, regular rhythm, S1 normal, S2 normal, normal heart sounds, intact distal pulses and normal pulses. Exam reveals no gallop and no friction rub.  No murmur heard. Pulmonary/Chest: Effort normal and breath sounds normal. No tachypnea. No respiratory distress. She has no decreased breath sounds. She has no wheezes. She has no rhonchi. She has no rales.  Abdominal: Soft. Normal appearance and bowel sounds are normal. There is no tenderness.  Musculoskeletal:       Right hip: Normal. She exhibits normal range of motion, normal strength and no tenderness.       Left hip: Normal. She exhibits normal range of motion, normal strength and no tenderness.  Neurological: She is alert.  Skin: Skin is warm, dry and intact. No rash noted.  Psychiatric: Her speech is normal and behavior is normal. Judgment and thought content normal. Her mood appears not anxious. Cognition and memory are normal. She does not exhibit a depressed mood.          Assessment & Plan:

## 2017-12-23 NOTE — Assessment & Plan Note (Signed)
Likely bursitis.. Improved with diclofenac.. Take with PPI to protect stomach and limit use.  Continue home PT.. If not improving return to See Dr. Gregary Signs for injection.

## 2017-12-23 NOTE — Assessment & Plan Note (Signed)
Improving with anti-inflammatory. Neg rheum work up.

## 2017-12-23 NOTE — Patient Instructions (Signed)
Make sure to start home PT.  Can use diclofenac 75 mg twice daily.. Use as needed not daily.. Take prilosec 20 mg when taking to protect stomach. Call if pain is not controlled or worsening for appt with Dr. Lorelei Pont for injection.

## 2018-02-07 ENCOUNTER — Telehealth: Payer: Self-pay | Admitting: Family Medicine

## 2018-02-07 DIAGNOSIS — Z13220 Encounter for screening for lipoid disorders: Secondary | ICD-10-CM

## 2018-02-07 NOTE — Telephone Encounter (Signed)
-----   Message from Lendon Collar, RT sent at 02/02/2018  9:37 AM EDT ----- Regarding: Lab orders for Friday 02/13/18 Please enter CPE lab orders for 02/13/18. Thanks!

## 2018-02-13 ENCOUNTER — Other Ambulatory Visit (INDEPENDENT_AMBULATORY_CARE_PROVIDER_SITE_OTHER): Payer: 59

## 2018-02-13 DIAGNOSIS — Z13220 Encounter for screening for lipoid disorders: Secondary | ICD-10-CM

## 2018-02-13 LAB — COMPREHENSIVE METABOLIC PANEL
ALBUMIN: 3.9 g/dL (ref 3.5–5.2)
ALK PHOS: 73 U/L (ref 39–117)
ALT: 13 U/L (ref 0–35)
AST: 12 U/L (ref 0–37)
BUN: 13 mg/dL (ref 6–23)
CO2: 25 mEq/L (ref 19–32)
CREATININE: 0.9 mg/dL (ref 0.40–1.20)
Calcium: 8.9 mg/dL (ref 8.4–10.5)
Chloride: 108 mEq/L (ref 96–112)
GFR: 70.03 mL/min (ref >60.00)
GLUCOSE: 91 mg/dL (ref 70–99)
POTASSIUM: 4.1 meq/L (ref 3.5–5.1)
SODIUM: 139 meq/L (ref 135–145)
TOTAL PROTEIN: 6.9 g/dL (ref 6.0–8.3)
Total Bilirubin: 0.5 mg/dL (ref 0.2–1.2)

## 2018-02-13 LAB — LIPID PANEL
CHOLESTEROL: 190 mg/dL (ref 0–200)
HDL: 47.8 mg/dL (ref >39.00)
LDL CALC: 127 mg/dL — AB (ref 0–99)
NONHDL: 142.27
Total CHOL/HDL Ratio: 4
Triglycerides: 77 mg/dL (ref 0.0–149.0)
VLDL: 15.4 mg/dL (ref 0.0–40.0)

## 2018-02-20 ENCOUNTER — Encounter: Payer: Self-pay | Admitting: Family Medicine

## 2018-02-20 ENCOUNTER — Ambulatory Visit (INDEPENDENT_AMBULATORY_CARE_PROVIDER_SITE_OTHER): Payer: 59 | Admitting: Family Medicine

## 2018-02-20 VITALS — BP 100/70 | HR 76 | Temp 98.0°F | Ht 64.0 in | Wt 195.0 lb

## 2018-02-20 DIAGNOSIS — E6609 Other obesity due to excess calories: Secondary | ICD-10-CM | POA: Diagnosis not present

## 2018-02-20 DIAGNOSIS — M25551 Pain in right hip: Secondary | ICD-10-CM

## 2018-02-20 DIAGNOSIS — G8929 Other chronic pain: Secondary | ICD-10-CM

## 2018-02-20 DIAGNOSIS — F3340 Major depressive disorder, recurrent, in remission, unspecified: Secondary | ICD-10-CM

## 2018-02-20 DIAGNOSIS — M25552 Pain in left hip: Secondary | ICD-10-CM

## 2018-02-20 DIAGNOSIS — Z0000 Encounter for general adult medical examination without abnormal findings: Secondary | ICD-10-CM

## 2018-02-20 DIAGNOSIS — Z6833 Body mass index (BMI) 33.0-33.9, adult: Secondary | ICD-10-CM

## 2018-02-20 NOTE — Assessment & Plan Note (Signed)
Encouraged exercise, weight loss, healthy eating habits. ? ?

## 2018-02-20 NOTE — Progress Notes (Signed)
   Subjective:    Patient ID: Kelsey Carlson, female    DOB: Jan 17, 1966, 52 y.o.   MRN: [id number]  HPI  The patient is here for annual wellness exam and preventative care.     She is doing well overall.  Chronic hip pain bilateral: Hip bursitis: improved with diclofenac prn.  Body mass index is 33.47 kg/m. Diet:  Moderate Exercise:none   Social History /Family History/Past Medical History reviewed in detail and updated in EMR if needed. Blood pressure 100/70, pulse 76, temperature 98 F (36.7 C), temperature source Oral, height 5\' 4"  (1.626 m), weight 195 lb (88.5 kg), last menstrual period 02/12/2018.  Review of Systems  Constitutional: Negative for fatigue and fever.  HENT: Negative for congestion.   Eyes: Negative for pain.  Respiratory: Negative for cough and shortness of breath.   Cardiovascular: Negative for chest pain, palpitations and leg swelling.  Gastrointestinal: Negative for abdominal pain.  Genitourinary: Negative for dysuria and vaginal bleeding.  Musculoskeletal: Negative for back pain.  Neurological: Negative for syncope, light-headedness and headaches.  Psychiatric/Behavioral: Negative for dysphoric mood.       Objective:   Physical Exam  Constitutional: Vital signs are normal. She appears well-developed and well-nourished. She is cooperative.  Non-toxic appearance. She does not appear ill. No distress.  HENT:  Head: Normocephalic.  Right Ear: Hearing, tympanic membrane, external ear and ear canal normal.  Left Ear: Hearing, tympanic membrane, external ear and ear canal normal.  Nose: Nose normal.  Eyes: Pupils are equal, round, and reactive to light. Conjunctivae, EOM and lids are normal. Lids are everted and swept, no foreign bodies found.  Neck: Trachea normal and normal range of motion. Neck supple. Carotid bruit is not present. No thyroid mass and no thyromegaly present.  Cardiovascular: Normal rate, regular rhythm, S1 normal, S2 normal, normal  heart sounds and intact distal pulses. Exam reveals no gallop.  No murmur heard. Pulmonary/Chest: Effort normal and breath sounds normal. No respiratory distress. She has no wheezes. She has no rhonchi. She has no rales.  Abdominal: Soft. Normal appearance and bowel sounds are normal. She exhibits no distension, no fluid wave, no abdominal bruit and no mass. There is no hepatosplenomegaly. There is no tenderness. There is no rebound, no guarding and no CVA tenderness. No hernia.  Lymphadenopathy:    She has no cervical adenopathy.    She has no axillary adenopathy.  Neurological: She is alert. She has normal strength. No cranial nerve deficit or sensory deficit.  Skin: Skin is warm, dry and intact. No rash noted.  Psychiatric: Her speech is normal and behavior is normal. Judgment normal. Her mood appears not anxious. Cognition and memory are normal. She does not exhibit a depressed mood.          Assessment & Plan:  The patient's preventative maintenance and recommended screening tests for an annual wellness exam were reviewed in full today. Brought up to date unless services declined.  Counselled on the importance of diet, exercise, and its role in overall health and mortality. The patient's FH and SH was reviewed, including their home life, tobacco status, and drug and alcohol status.   Vaccines: Td uptodate, due for flu...refused Pap/DVE:  Per GYN Mammo: Due  Per GYN.. Mother with breast cancer died age 44. Colon:  09/09/2014 repeat in 10 years, Dr. Collene Mares Smoking Status:none ETOH/ drug use:  rare/none

## 2018-02-20 NOTE — Assessment & Plan Note (Signed)
Doing well overall 

## 2018-02-20 NOTE — Patient Instructions (Signed)
Work on low fat low cholesterol diet. Increase exercise and weight loss.

## 2018-02-20 NOTE — Assessment & Plan Note (Signed)
Much improved with diclofenac prn.

## 2018-02-24 DIAGNOSIS — Z01419 Encounter for gynecological examination (general) (routine) without abnormal findings: Secondary | ICD-10-CM | POA: Diagnosis not present

## 2018-02-24 DIAGNOSIS — Z124 Encounter for screening for malignant neoplasm of cervix: Secondary | ICD-10-CM | POA: Diagnosis not present

## 2018-02-25 ENCOUNTER — Other Ambulatory Visit: Payer: Self-pay | Admitting: Obstetrics and Gynecology

## 2018-02-25 DIAGNOSIS — N6320 Unspecified lump in the left breast, unspecified quadrant: Secondary | ICD-10-CM

## 2018-02-25 DIAGNOSIS — Z124 Encounter for screening for malignant neoplasm of cervix: Secondary | ICD-10-CM | POA: Diagnosis not present

## 2018-03-02 ENCOUNTER — Encounter: Payer: Self-pay | Admitting: Family Medicine

## 2018-03-04 ENCOUNTER — Ambulatory Visit
Admission: RE | Admit: 2018-03-04 | Discharge: 2018-03-04 | Disposition: A | Discharge status: Home / Self Care | Payer: 59 | Source: Ambulatory Visit | Attending: Obstetrics and Gynecology | Admitting: Obstetrics and Gynecology

## 2018-03-04 DIAGNOSIS — N6489 Other specified disorders of breast: Secondary | ICD-10-CM | POA: Diagnosis not present

## 2018-03-04 DIAGNOSIS — N6320 Unspecified lump in the left breast, unspecified quadrant: Secondary | ICD-10-CM

## 2018-03-04 DIAGNOSIS — R922 Inconclusive mammogram: Secondary | ICD-10-CM | POA: Diagnosis not present

## 2018-03-05 LAB — HM PAP SMEAR: HM Pap smear: NEGATIVE

## 2018-11-23 ENCOUNTER — Ambulatory Visit (INDEPENDENT_AMBULATORY_CARE_PROVIDER_SITE_OTHER): Payer: 59 | Admitting: Family Medicine

## 2018-11-23 ENCOUNTER — Encounter: Payer: Self-pay | Admitting: Family Medicine

## 2018-11-23 VITALS — BP 138/90 | Temp 97.8°F | Ht 64.0 in | Wt 192.0 lb

## 2018-11-23 DIAGNOSIS — R519 Headache, unspecified: Secondary | ICD-10-CM

## 2018-11-23 DIAGNOSIS — M5412 Radiculopathy, cervical region: Secondary | ICD-10-CM

## 2018-11-23 DIAGNOSIS — R51 Headache: Secondary | ICD-10-CM | POA: Diagnosis not present

## 2018-11-23 DIAGNOSIS — R200 Anesthesia of skin: Secondary | ICD-10-CM

## 2018-11-23 MED ORDER — CYCLOBENZAPRINE HCL 10 MG PO TABS
5.0000 mg | ORAL_TABLET | Freq: Every evening | ORAL | 1 refills | Status: AC | PRN
Start: 2018-11-23 — End: 2019-11-23

## 2018-11-23 MED ORDER — PREDNISONE 20 MG PO TABS
ORAL_TABLET | ORAL | 0 refills | Status: DC
Start: 2018-11-23 — End: 2018-12-28

## 2018-11-23 NOTE — Progress Notes (Signed)
Sherley Leser T. Lynnell Fiumara, MD Primary Care and [place] [facility] [place], [address] Phone: [phone]  FAX: [place] - 53 y.o. female  MRN [id number]  Date of Birth: April 10, 1966  Visit Date: 11/23/2018  PCP: Jinny Sanders, MD  Referred by: Jinny Sanders, MD Chief Complaint  Patient presents with  . Headache    x 2 weeks (tension)  . Numbness    in Arms/Hands at night   Virtual Visit via Video Note:  I connected with  Kelsey Carlson on 11/23/2018  9:40 AM EDT by a video enabled telemedicine application and verified that I am speaking with the correct person using two identifiers.   Location patient: home computer, tablet, or smartphone Location provider: work or home office Consent: Verbal consent directly obtained from [employer]. Persons participating in the virtual visit: patient, provider  I discussed the limitations of evaluation and management by telemedicine and the availability of in person appointments. The patient expressed understanding and agreed to proceed.  History of Present Illness:  Ha for a couple of days, then she came back.  Tense in the back of her neck.   She has had a headache for about 2 weeks off and on, particularly in the posterior occiput region.  She has pain with range of motion and essentially all directions.  She does not have a history of migraine.  She does have intermittent tension headaches off and on.  No problems with light or sound currently.  She is also having some numbness and tingling down both of her arms, some weakness in the right arm, and she has some known disc bulging in this region.  This is the first time it flared up in quite a while.  Review of Systems as above: See pertinent positives and pertinent negatives per HPI No acute distress verbally  Past Medical History, Surgical History, Social History, Family History,  Problem List, Medications, and Allergies have been reviewed and updated if relevant.   Observations/Objective/Exam:  An attempt was made to discern vital signs over the phone and per patient if applicable and possible.   General:    Alert, Oriented, appears well and in no acute distress HEENT:     Atraumatic, conjunctiva clear, no obvious abnormalities on inspection of external nose and ears.  Neck:    Normal movements of the head and neck Pulmonary:     On inspection no signs of respiratory distress, breathing rate appears normal, no obvious gross SOB, gasping or wheezing Cardiovascular:    No obvious cyanosis Musculoskeletal:    Moves all visible extremities without noticeable abnormality Psych / Neurological:     Pleasant and cooperative, no obvious depression or anxiety, speech and thought processing grossly intact  Assessment and Plan:    ICD-10-CM   1. Acute intractable headache, unspecified headache type  R51   2. Bilateral hand numbness  R20.0   3. Cervical radiculopathy, acute  M54.12    Level of Medical Decision-Making in this case is Moderate.  Bilateral hand numbness and weakness secondary to cervical radiculopathy.  Pulse with steroids, Flexeril at nighttime.  Follow-up 3 weeks if not improved.  Acute headache, likely will improve with prednisone as well as Flexeril.  I discussed the assessment and treatment plan with the patient. The patient was provided an opportunity to ask questions and all were answered. The patient agreed with the plan and demonstrated an understanding  of the instructions.   The patient was advised to call back or seek an in-person evaluation if the symptoms worsen or if the condition fails to improve as anticipated.  Follow-up: prn unless noted otherwise below No follow-ups on file.  Meds ordered this encounter  Medications  . predniSONE (DELTASONE) 20 MG tablet    Sig: 2 tabs po daily for 5 days, then 1 tab po daily for 5 days     Dispense:  15 tablet    Refill:  0  . cyclobenzaprine (FLEXERIL) 10 MG tablet    Sig: Take 0.5-1 tablets (5-10 mg total) by mouth at bedtime as needed for muscle spasms.    Dispense:  30 tablet    Refill:  1   No orders of the defined types were placed in this encounter.   Signed,  Maud Deed. Renezmae Canlas, MD

## 2018-12-28 ENCOUNTER — Other Ambulatory Visit: Payer: Self-pay

## 2018-12-28 ENCOUNTER — Ambulatory Visit (INDEPENDENT_AMBULATORY_CARE_PROVIDER_SITE_OTHER): Payer: 59 | Admitting: Family Medicine

## 2018-12-28 ENCOUNTER — Encounter: Payer: Self-pay | Admitting: Family Medicine

## 2018-12-28 VITALS — BP 116/80 | HR 84 | Temp 98.3°F | Ht 64.0 in | Wt 194.5 lb

## 2018-12-28 DIAGNOSIS — G8929 Other chronic pain: Secondary | ICD-10-CM | POA: Diagnosis not present

## 2018-12-28 DIAGNOSIS — M545 Low back pain, unspecified: Secondary | ICD-10-CM

## 2018-12-28 MED ORDER — PREDNISONE 20 MG PO TABS: ORAL_TABLET | ORAL | 0 refills | Status: DC

## 2018-12-28 NOTE — Progress Notes (Signed)
   Kelsey T. Copland, MD Primary Care and Sports Medicine  [facility] [address] [name], [address] Phone: [phone]  FAX: [phone]  Kelsey Carlson - 53 y.o. female  MRN [id number]  Date of Birth: 04/12/1966  Visit Date: 12/28/2018  PCP: Bedsole, Amy E, MD  Referred by: Bedsole, Amy E, MD  Chief Complaint  Patient presents with  . Back Pain   Subjective:   Kelsey Carlson is a 53 y.o. very pleasant female patient with Body mass index is 33.39 kg/m. who presents with the following:  Now the RIGHT side. Swept and mopped and made babna bread in the kitchen.  Heat, cold, advil, then husband out of bed, Advil, muscle relxer, burning.  She has had ongoing neck and back pain for greater than 20 years.  I saw her earlier in the year with some radicular neck pain.  Night now she is having some significant low back pain without radiculopathy.  She denies saddle anesthesia.  No bowel or bladder incontinence.  She does have some pain in the buttocks region bilaterally.  1997 lumbar laminectomy.  Dr. Leibowits (ret)  Leg lift on R buttocks pain     Past Medical History, Surgical History, Social History, Family History, Problem List, Medications, and Allergies have been reviewed and updated if relevant.  Patient Active Problem List   Diagnosis Date Noted  . Chronic hip pain, bilateral 12/02/2017  . Chronic back pain 12/02/2017  . Class 1 obesity without serious comorbidity with body mass index (BMI) of 33.0 to 33.9 in adult 12/08/2014  . Varicose veins of both legs with edema 12/08/2014  . Insomnia 02/18/2012  . MDD (recurrent major depressive disorder) in remission (HCC) 01/16/2012  . TRANSAMINASES, SERUM, ELEVATED 05/27/2008  . NEPHROLITHIASIS, HX OF 05/23/2008  . SLEEP APNEA 05/30/2007  . NECK PAIN, CHRONIC 02/25/2007    Past Medical History:  Diagnosis Date  . Arthritis   . Gallstones   . History of kidney  stones   . Lumbar back pain 06/28/2013  . Nephrolithiasis   . Urolithiasis   . Wears glasses     Past Surgical History:  Procedure Laterality Date  . 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 BLADDER SUSPENSION  2008   sling-cysto  . breast biopsy  2002   lt br bx  . BREAST CYST EXCISION Left 02/19/2013   Procedure:  EXCISION BREAST MASS;  Surgeon: Thomas A. Cornett, MD;  Location: [facility];  Service: General;  Laterality: Left;  . BREAST EXCISIONAL BIOPSY Left 2014   benign  . BREAST SURGERY    . CHOLECYSTECTOMY N/A 08/27/2017   Procedure: LAPAROSCOPIC CHOLECYSTECTOMY WITH INTRAOPERATIVE CHOLANGIOGRAM;  Surgeon: Toth, Paul III, MD;  Location: [facility] OR;  Service: General;  Laterality: N/A;  . DILATION AND CURETTAGE OF UTERUS    . LUMBAR DISC SURGERY  1997   lumb lam  . TUBAL LIGATION     [facility]    Social History   Socioeconomic History  . Marital status: Married    Spouse name: Not on file  . Number of children: Not on file  . Years of education: Not on file  . Highest education level: Not on file  Occupational History    Employer: [employer]    Comment: Mailroom  Social Needs  . Financial resource strain: Not on file  . Food insecurity    Worry: Not on file      Inability: Not on file  . Transportation needs    Medical: Not on file    Non-medical: Not on file  Tobacco Use  . Smoking status: Former Smoker    Quit date: 05/20/1994    Years since quitting: 24.6  . Smokeless tobacco: Never Used  Substance and Sexual Activity  . Alcohol use: Yes    Comment: socially  . Drug use: No  . Sexual activity: Yes    Birth control/protection: Surgical    Comment: [facility] BTL  Lifestyle  . Physical activity    Days per week: Not on file    Minutes per session: Not on file  . Stress: Not on file  Relationships  . Social connections    Talks on phone: Not on file    Gets together: Not on file    Attends religious service: Not on file    Active  member of club or organization: Not on file    Attends meetings of clubs or organizations: Not on file    Relationship status: Not on file  . Intimate partner violence    Fear of current or ex partner: Not on file    Emotionally abused: Not on file    Physically abused: Not on file    Forced sexual activity: Not on file  Other Topics Concern  . Not on file  Social History Narrative   Regular exercise- yes 3 x a week      Diet- fruits and veggies    Family History  Problem Relation Age of Onset  . Cancer Mother 53       breast  . Hypertension Father     No Known Allergies  Medication list reviewed and updated in full in [facility].  GEN: No fevers, chills. Nontoxic. Primarily MSK c/o today. MSK: Detailed in the HPI GI: tolerating PO intake without difficulty Neuro: No numbness, parasthesias, or tingling associated. Otherwise the pertinent positives of the ROS are noted above.   Objective:   BP 116/80   Pulse 84   Temp 98.3 F (36.8 C) (Temporal)   Ht 5' 4" (1.626 m)   Wt 194 lb 8 oz (88.2 kg)   LMP 12/07/2018   SpO2 98%   BMI 33.39 kg/m    GEN: Well-developed,well-nourished,in no acute distress; alert,appropriate and cooperative throughout examination HEENT: Normocephalic and atraumatic without obvious abnormalities. Ears, externally no deformities PULM: Breathing comfortably in no respiratory distress EXT: No clubbing, cyanosis, or edema PSYCH: Normally interactive. Cooperative during the interview. Pleasant. Friendly and conversant. Not anxious or depressed appearing. Normal, full affect.  Range of motion at  the waist: Flexion, extension, lateral bending and rotation: Forward flexion to 45 degrees.  Extension is minimal.  Lateral rotation is approximately decreased by 20%, as well as rotational movements.  No echymosis or edema Rises to examination table with mild difficulty Gait: minimally antalgic  Inspection/Deformity: N Paraspinus Tenderness:  Primary pain is from L5-S1 with significant pain in the buttocks region right greater than left.  B Ankle Dorsiflexion (L5,4): 5/5 B Great Toe Dorsiflexion (L5,4): 5/5 Heel Walk (L5): WNL Toe Walk (S1): WNL Rise/Squat (L4): WNL, mild pain  SENSORY B Medial Foot (L4): WNL B Dorsum (L5): WNL B Lateral (S1): WNL Light Touch: WNL Pinprick: WNL  REFLEXES Knee (L4): 2+ Ankle (S1): 2+  B SLR, seated: R buttocks pain only B SLR, supine: R buttocks pain only B FABER: neg B Reverse FABER: neg B Greater Troch: NT B Log Roll: neg B   Stork: NT B Sciatic Notch: NT   Radiology: No results found.   Assessment and Plan:     ICD-10-CM   1. Chronic bilateral low back pain without sciatica  M54.5    G89.29    Pulse with steroids, has muscle relaxants at home.  She is well versed in how to do rehab when this starts to calm down.  Follow-up: No follow-ups on file.  Meds ordered this encounter  Medications  . predniSONE (DELTASONE) 20 MG tablet    Sig: 2 tabs po daily for 5 days, then 1 tab po daily for 5 days    Dispense:  15 tablet    Refill:  0   No orders of the defined types were placed in this encounter.   Signed,  Maud Deed. Manav Pierotti, MD   Outpatient Encounter Medications as of 12/28/2018  Medication Sig  . cyclobenzaprine (FLEXERIL) 10 MG tablet Take 0.5-1 tablets (5-10 mg total) by mouth at bedtime as needed for muscle spasms.  . diclofenac (VOLTAREN) 75 MG EC tablet Take 1 tablet (75 mg total) by mouth 2 (two) times daily.  . predniSONE (DELTASONE) 20 MG tablet 2 tabs po daily for 5 days, then 1 tab po daily for 5 days  . [DISCONTINUED] predniSONE (DELTASONE) 20 MG tablet 2 tabs po daily for 5 days, then 1 tab po daily for 5 days (Patient not taking: Reported on 12/28/2018)   No facility-administered encounter medications on file as of 12/28/2018.

## 2019-04-13 IMAGING — MG DIGITAL DIAGNOSTIC BILATERAL MAMMOGRAM WITH TOMO AND CAD
6 of 12 series · 6 of 36 positions shown · non-contrast
Comparison: Previous exam(s).

CLINICAL DATA: Patient presents with reported lumps in the axillary
tail region of the left breast.

EXAM:
DIGITAL DIAGNOSTIC BILATERAL MAMMOGRAM WITH CAD AND TOMO
ULTRASOUND LEFT BREAST

[L TAN synth-2D]
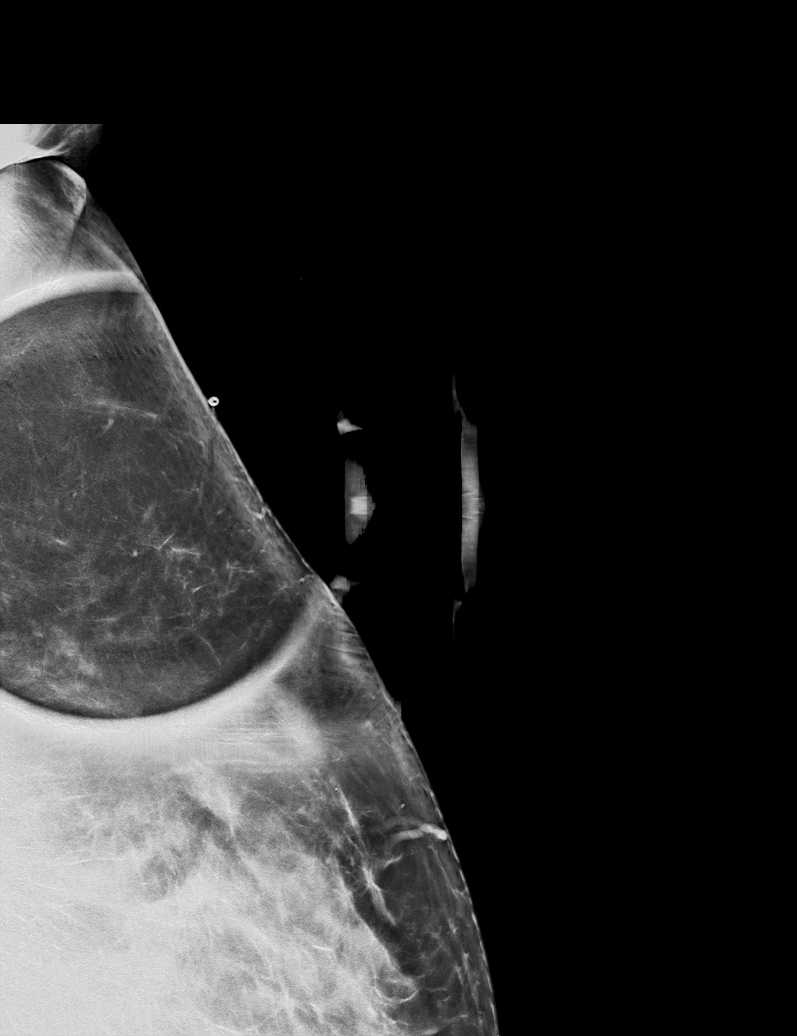

[L XCCL synth-2D]
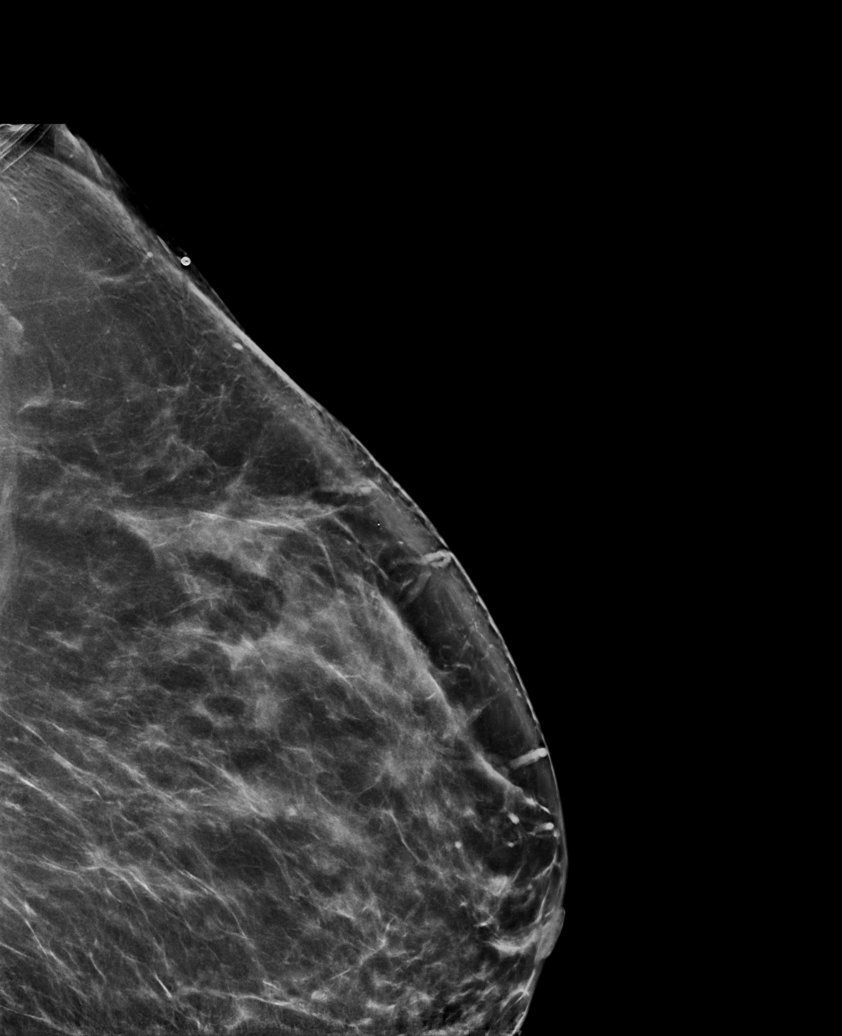

[L CC synth-2D]
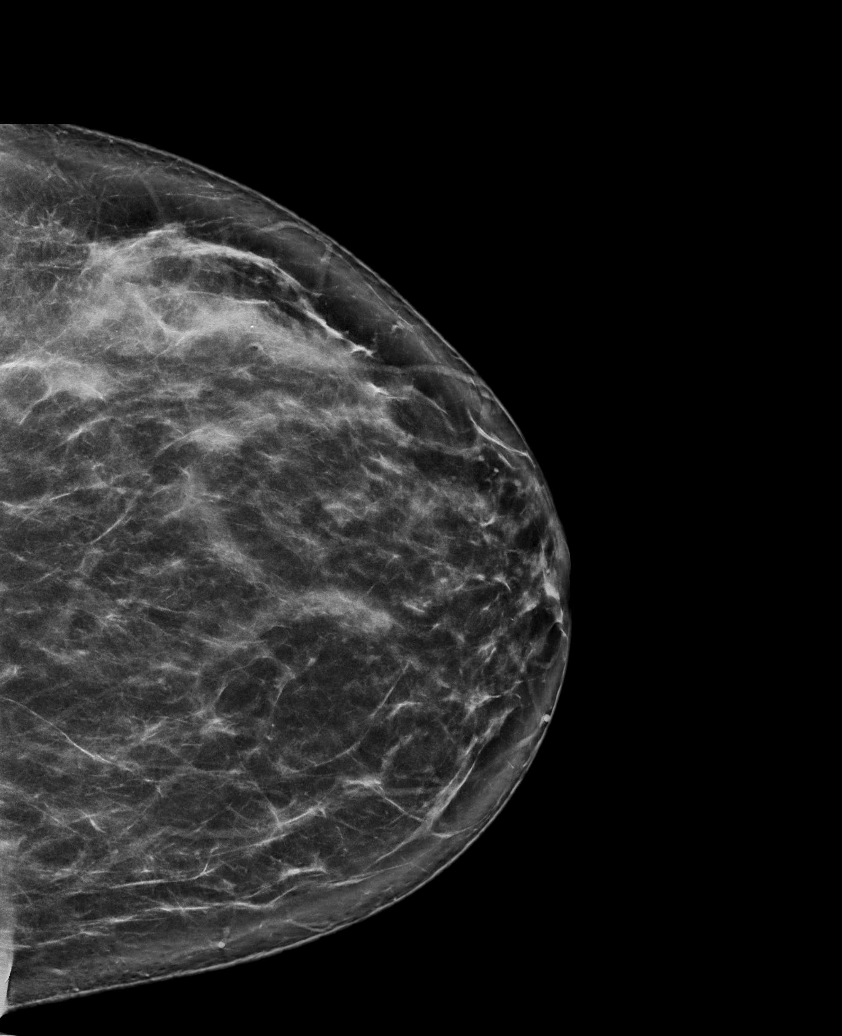

[R CC synth-2D]
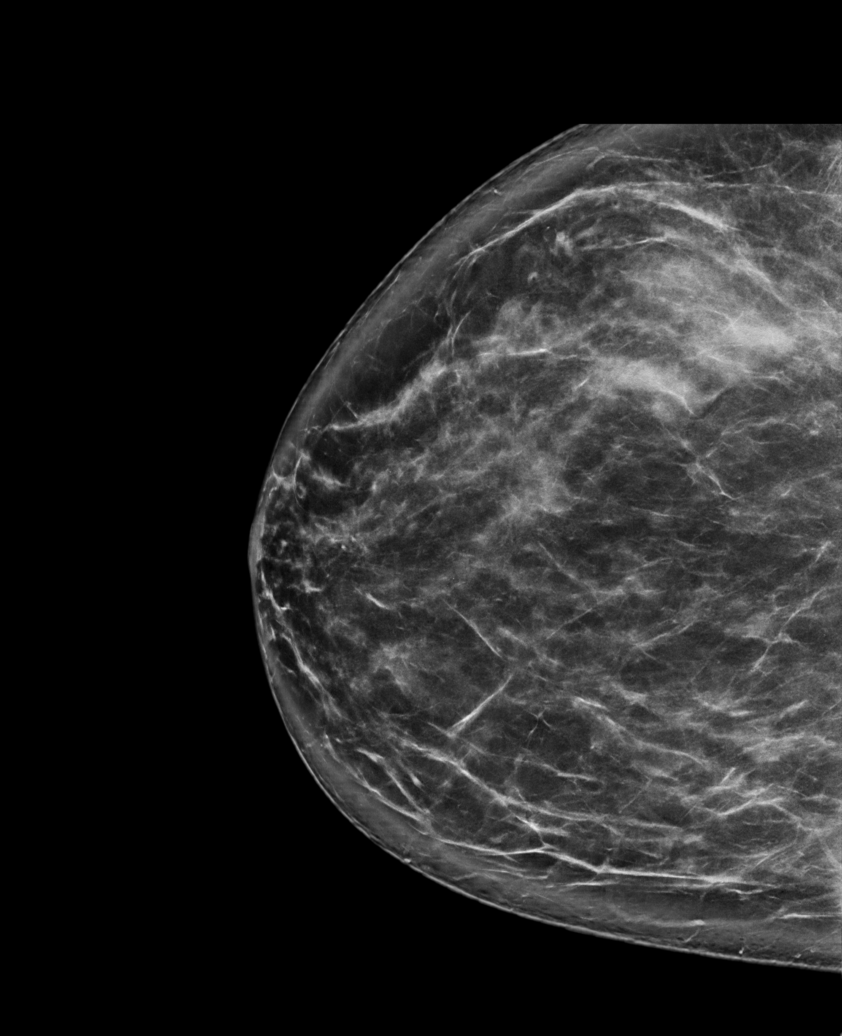

[R MLO synth-2D]
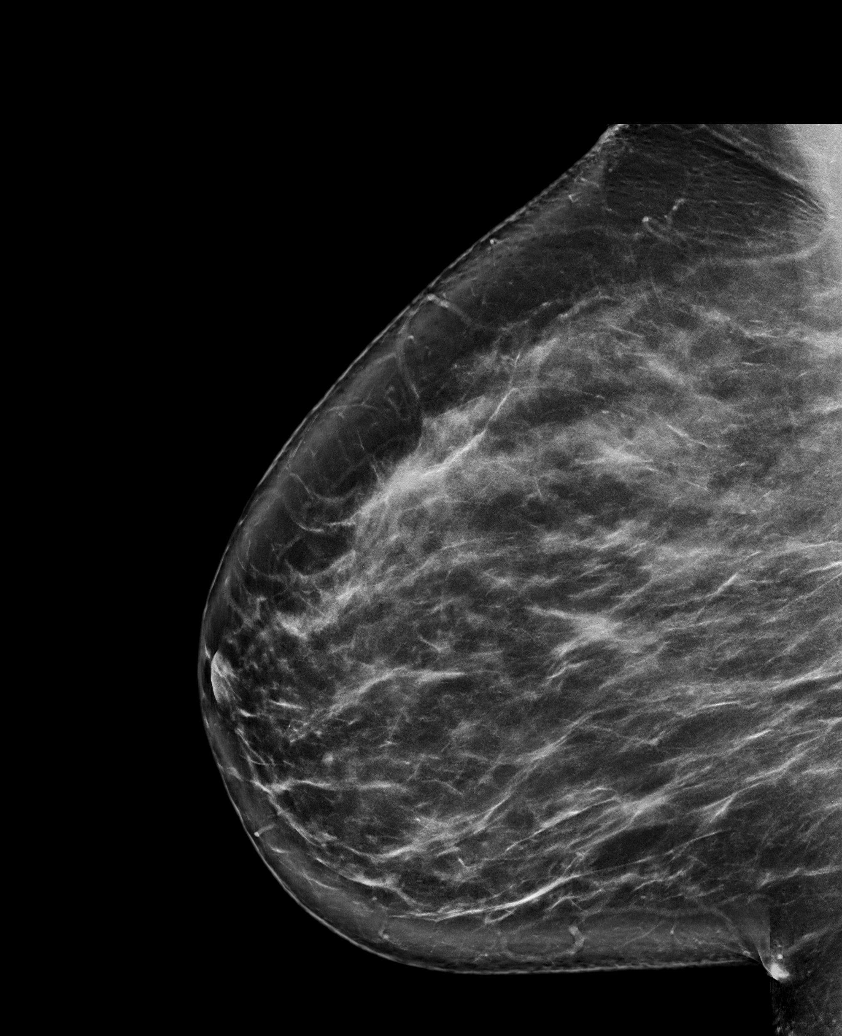

[L MLO synth-2D]
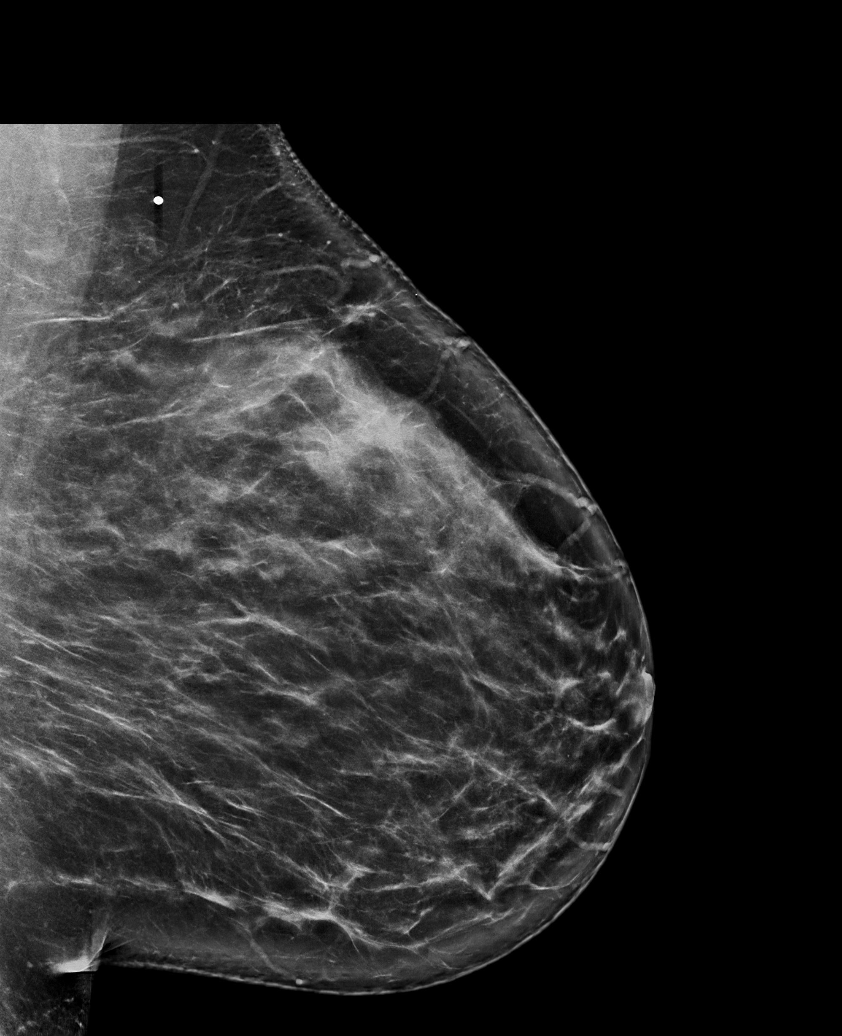

[6 of 36 positions shown; findings below may reference images not displayed]

ACR Breast Density Category c: The breast tissue is heterogeneously
dense, which may obscure small masses.
FINDINGS: There are no discrete masses. There is stable postsurgical
architectural distortion in the upper outer left breast from a
previous benign excision. There are no other areas of architectural
distortion. There are no suspicious calcifications. No mammographic
change.

Mammographic images were processed with CAD.

On physical exam, no discrete mass is palpated in the upper outer
left breast or axilla.

Targeted ultrasound is performed, showing normal tissue throughout
the upper outer breast. Normal appearance of the left axilla with no
enlarged or abnormal lymph nodes.
IMPRESSION: Negative exam.  No evidence of breast malignancy.

RECOMMENDATION:
Screening mammogram in one year.(Code:[id number])

I have discussed the findings and recommendations with the patient.
Results were also provided in writing at the conclusion of the
visit. If applicable, a reminder letter will be sent to the patient
regarding the next appointment.

BI-RADS CATEGORY  1: Negative.

## 2019-04-22 ENCOUNTER — Other Ambulatory Visit (INDEPENDENT_AMBULATORY_CARE_PROVIDER_SITE_OTHER): Payer: 59

## 2019-04-22 ENCOUNTER — Other Ambulatory Visit: Payer: Self-pay

## 2019-04-22 ENCOUNTER — Telehealth: Payer: Self-pay | Admitting: Family Medicine

## 2019-04-22 DIAGNOSIS — Z13220 Encounter for screening for lipoid disorders: Secondary | ICD-10-CM

## 2019-04-22 LAB — COMPREHENSIVE METABOLIC PANEL
ALT: 21 U/L (ref 0–35)
AST: 17 U/L (ref 0–37)
Albumin: 4.2 g/dL (ref 3.5–5.2)
Alkaline Phosphatase: 75 U/L (ref 39–117)
BUN: 13 mg/dL (ref 6–23)
CO2: 27 mEq/L (ref 19–32)
Calcium: 9.2 mg/dL (ref 8.4–10.5)
Chloride: 105 mEq/L (ref 96–112)
Creatinine, Ser: 0.81 mg/dL (ref 0.40–1.20)
GFR: 74.06 mL/min (ref >60.00)
Glucose, Bld: 89 mg/dL (ref 70–99)
Potassium: 4.2 mEq/L (ref 3.5–5.1)
Sodium: 141 mEq/L (ref 135–145)
Total Bilirubin: 0.7 mg/dL (ref 0.2–1.2)
Total Protein: 6.7 g/dL (ref 6.0–8.3)

## 2019-04-22 LAB — LIPID PANEL
Cholesterol: 218 mg/dL — ABNORMAL HIGH (ref 0–200)
HDL: 51.1 mg/dL (ref >39.00)
LDL Cholesterol: 146 mg/dL — ABNORMAL HIGH (ref 0–99)
NonHDL: 166.43
Total CHOL/HDL Ratio: 4
Triglycerides: 100 mg/dL (ref 0.0–149.0)
VLDL: 20 mg/dL (ref 0.0–40.0)

## 2019-04-22 NOTE — Progress Notes (Signed)
No critical labs need to be addressed urgently. We will discuss labs in detail at upcoming office visit.   

## 2019-04-22 NOTE — Telephone Encounter (Signed)
-----   Message from Ellamae Sia sent at 04/07/2019 10:45 AM EST ----- Regarding: Lab orders for Thursday, 12.3.20 Patient is scheduled for CPX labs, please order future labs, Thanks , Karna Christmas

## 2019-04-29 ENCOUNTER — Ambulatory Visit (INDEPENDENT_AMBULATORY_CARE_PROVIDER_SITE_OTHER): Payer: 59 | Admitting: Family Medicine

## 2019-04-29 ENCOUNTER — Other Ambulatory Visit: Payer: Self-pay

## 2019-04-29 ENCOUNTER — Encounter: Payer: Self-pay | Admitting: Family Medicine

## 2019-04-29 VITALS — BP 118/74 | HR 79 | Temp 97.5°F | Ht 64.0 in | Wt 199.2 lb

## 2019-04-29 DIAGNOSIS — M5441 Lumbago with sciatica, right side: Secondary | ICD-10-CM

## 2019-04-29 DIAGNOSIS — G8929 Other chronic pain: Secondary | ICD-10-CM

## 2019-04-29 DIAGNOSIS — Z0000 Encounter for general adult medical examination without abnormal findings: Secondary | ICD-10-CM | POA: Diagnosis not present

## 2019-04-29 DIAGNOSIS — R079 Chest pain, unspecified: Secondary | ICD-10-CM

## 2019-04-29 NOTE — Assessment & Plan Note (Signed)
Description mpst like noncardiac. Likely GI related. Trial of PPI and trigger avoidance.

## 2019-04-29 NOTE — Progress Notes (Signed)
Chief Complaint  Patient presents with  . Annual Exam    History of Present Illness: HPI   The patient is here for annual wellness exam and preventative care.    She occ chest pain when lying down at night.. no heartburn. Not currently taking NSAIDs No extertion a pain. No SOB  Reviewed lipids and CMET. : Chronic low back pain:  Right side. NSAIDs, muscle relaxer.. prednisone helped with flare in 12/2018.  Sore in low back with walking longtime.  1997 lumbar laminectomy.  Dr. Mammie Lorenzo (ret)   LDL > goal < 130, but no indication for statin, AHA 10 year risk 1.5%.  Diet: moderate Exercise: none She is doing well overall.   This visit occurred during the SARS-CoV-2 public health emergency.  Safety protocols were in place, including screening questions prior to the visit, additional usage of staff PPE, and extensive cleaning of exam room while observing appropriate contact time as indicated for disinfecting solutions.   COVID 19 screen:  No recent travel or known exposure to COVID19 The patient denies respiratory symptoms of COVID 19 at this time. The importance of social distancing was discussed today.     Review of Systems  Constitutional: Negative for chills and fever.  HENT: Negative for congestion and ear pain.   Eyes: Negative for pain and redness.  Respiratory: Negative for cough and shortness of breath.   Cardiovascular: Negative for chest pain, palpitations and leg swelling.  Gastrointestinal: Negative for abdominal pain, blood in stool, constipation, diarrhea, nausea and vomiting.  Genitourinary: Negative for dysuria.  Musculoskeletal: Negative for falls and myalgias.  Skin: Negative for rash.  Neurological: Negative for dizziness.  Psychiatric/Behavioral: Negative for depression. The patient is not nervous/anxious.       Past Medical History:  Diagnosis Date  . Arthritis   . Gallstones   . History of kidney stones   . Lumbar back pain 06/28/2013  .  Nephrolithiasis   . Urolithiasis   . Wears glasses     reports that she quit smoking about 24 years ago. She has never used smokeless tobacco. She reports current alcohol use. She reports that she does not use drugs.   Current Outpatient Medications:  .  cyclobenzaprine (FLEXERIL) 10 MG tablet, Take 0.5-1 tablets (5-10 mg total) by mouth at bedtime as needed for muscle spasms., Disp: 30 tablet, Rfl: 1 .  diclofenac (VOLTAREN) 75 MG EC tablet, Take 1 tablet (75 mg total) by mouth 2 (two) times daily., Disp: 30 tablet, Rfl: 0   Observations/Objective: Blood pressure 118/74, pulse 79, temperature (!) 97.5 F (36.4 C), temperature source Temporal, height _0  (1.626 m), weight 199 lb 4 oz (90.4 kg), last menstrual period 04/21/2019, SpO2 98 %.  Physical Exam Constitutional:      General: She is not in acute distress.    Appearance: Normal appearance. She is well-developed. She is not ill-appearing or toxic-appearing.  HENT:     Head: Normocephalic.     Right Ear: Hearing, tympanic membrane, ear canal and external ear normal.     Left Ear: Hearing, tympanic membrane, ear canal and external ear normal.     Nose: Nose normal.  Eyes:     General: Lids are normal. Lids are everted, no foreign bodies appreciated.     Conjunctiva/sclera: Conjunctivae normal.     Pupils: Pupils are equal, round, and reactive to light.  Neck:     Thyroid: No thyroid mass or thyromegaly.     Vascular: No carotid bruit.  Trachea: Trachea normal.  Cardiovascular:     Rate and Rhythm: Normal rate and regular rhythm.     Heart sounds: Normal heart sounds, S1 normal and S2 normal. No murmur. No gallop.   Pulmonary:     Effort: Pulmonary effort is normal. No respiratory distress.     Breath sounds: Normal breath sounds. No wheezing, rhonchi or rales.  Abdominal:     General: Bowel sounds are normal. There is no distension or abdominal bruit.     Palpations: Abdomen is soft. There is no fluid wave or mass.      Tenderness: There is no abdominal tenderness. There is no guarding or rebound.     Hernia: No hernia is present.  Musculoskeletal:     Cervical back: Normal range of motion and neck supple.  Lymphadenopathy:     Cervical: No cervical adenopathy.  Skin:    General: Skin is warm and dry.     Findings: No rash.  Neurological:     Mental Status: She is alert.     Cranial Nerves: No cranial nerve deficit.     Sensory: No sensory deficit.  Psychiatric:        Mood and Affect: Mood is not anxious or depressed.        Speech: Speech normal.        Behavior: Behavior normal. Behavior is cooperative.        Judgment: Judgment normal.      Assessment and Plan The patient's preventative maintenance and recommended screening tests for an annual wellness exam were reviewed in full today. Brought up to date unless services declined.  Counselled on the importance of diet, exercise, and its role in overall health and mortality. The patient's FH and SH was reviewed, including their home life, tobacco status, and drug and alcohol status.   Vaccines: Td uptodate, due for flu...refused Pap/DVE:  Per GYN, last seen in 01/2019 Mammo: Due  Per GYN.. Mother with breast cancer died age 56. Plans BRCA1 and 2 testing Colon:  2016 repeat in 10 years, Dr. Collene Mares Smoking Status:none ETOH/ drug use:  rare/none   Chronic back pain Chronic intermittant issues with some baseline daily pain. No current flare.  pt would like referral to consider steroid injection. Referral made to PMR for eval.  Continue gentle home PT, weight loss.  Chest pain Description mpst like noncardiac. Likely GI related. Trial of PPI and trigger avoidance.     Eliezer Lofts, MD

## 2019-04-29 NOTE — Patient Instructions (Addendum)
Try a trial prilosec 2 -4 weeks for possible.  Avoid acid triggers.  We will call to set up referral for low back pain.  Work on low cholesterol diet.  Work on getting back to exercise 3-5 times a week.

## 2019-04-29 NOTE — Assessment & Plan Note (Signed)
Chronic intermittant issues with some baseline daily pain. No current flare.  pt would like referral to consider steroid injection. Referral made to PMR for eval.  Continue gentle home PT, weight loss.

## 2019-04-30 ENCOUNTER — Encounter: Payer: Self-pay | Admitting: Family Medicine

## 2019-08-03 ENCOUNTER — Ambulatory Visit (INDEPENDENT_AMBULATORY_CARE_PROVIDER_SITE_OTHER): Payer: 59 | Admitting: Family Medicine

## 2019-08-03 ENCOUNTER — Encounter: Payer: Self-pay | Admitting: Family Medicine

## 2019-08-03 ENCOUNTER — Other Ambulatory Visit: Payer: Self-pay

## 2019-08-03 DIAGNOSIS — K2900 Acute gastritis without bleeding: Secondary | ICD-10-CM | POA: Diagnosis not present

## 2019-08-03 NOTE — Progress Notes (Signed)
Chief Complaint  Patient presents with  . Abdominal Pain    Ate [place] on friday and hasn't been right since  . Nausea    After eating    History of Present Illness: HPI  54 year old female presents with new onset central abdominal pain and bloating in last 5 days.  Started after eating [place] food. Ate a lot.. took some gas X. Continued to feel very full. Tried Tums.  No diarrhea, no emesis. She is nauseous after eating.  no fever. Drinking some fluids, blander food.   She has been  Using aleve for hip pain. S/P cholecystectomy.  No sick contacts.   This visit occurred during the SARS-CoV-2 public health emergency.  Safety protocols were in place, including screening questions prior to the visit, additional usage of staff PPE, and extensive cleaning of exam room while observing appropriate contact time as indicated for disinfecting solutions.   COVID 19 screen:  No recent travel or known exposure to COVID19 The patient denies respiratory symptoms of COVID 19 at this time. The importance of social distancing was discussed today.     Review of Systems  Constitutional: Negative for chills and fever.  HENT: Negative for congestion and ear pain.   Eyes: Negative for pain and redness.  Respiratory: Negative for cough and shortness of breath.   Cardiovascular: Negative for chest pain, palpitations and leg swelling.  Gastrointestinal: Negative for abdominal pain, blood in stool, constipation, diarrhea, nausea and vomiting.  Genitourinary: Negative for dysuria.  Musculoskeletal: Negative for falls and myalgias.  Skin: Negative for rash.  Neurological: Negative for dizziness.  Psychiatric/Behavioral: Negative for depression. The patient is not nervous/anxious.       Past Medical History:  Diagnosis Date  . Arthritis   . Gallstones   . History of kidney stones   . Lumbar back pain 06/28/2013  . Nephrolithiasis   . Urolithiasis   . Wears glasses     reports that she  quit smoking about 25 years ago. She has never used smokeless tobacco. She reports current alcohol use. She reports that she does not use drugs.   Current Outpatient Medications:  .  Cholecalciferol (VITAMIN D-3) 125 MCG (5000 UT) TABS, Take 1 tablet by mouth daily., Disp: , Rfl:  .  cyclobenzaprine (FLEXERIL) 10 MG tablet, Take 0.5-1 tablets (5-10 mg total) by mouth at bedtime as needed for muscle spasms., Disp: 30 tablet, Rfl: 1 .  diclofenac (VOLTAREN) 75 MG EC tablet, Take 1 tablet (75 mg total) by mouth 2 (two) times daily., Disp: 30 tablet, Rfl: 0   Observations/Objective: Blood pressure 110/80, pulse 78, temperature (!) 97.2 F (36.2 C), temperature source Temporal, height 5\' 4"  (1.626 m), weight 201 lb 4 oz (91.3 kg), SpO2 98 %.  Physical Exam Constitutional:      General: She is not in acute distress.    Appearance: Normal appearance. She is well-developed. She is not ill-appearing or toxic-appearing.  HENT:     Head: Normocephalic.     Right Ear: Hearing, tympanic membrane, ear canal and external ear normal. Tympanic membrane is not erythematous, retracted or bulging.     Left Ear: Hearing, tympanic membrane, ear canal and external ear normal. Tympanic membrane is not erythematous, retracted or bulging.     Nose: No mucosal edema or rhinorrhea.     Right Sinus: No maxillary sinus tenderness or frontal sinus tenderness.     Left Sinus: No maxillary sinus tenderness or frontal sinus tenderness.  Mouth/Throat:     Pharynx: Uvula midline.  Eyes:     General: Lids are normal. Lids are everted, no foreign bodies appreciated.     Conjunctiva/sclera: Conjunctivae normal.     Pupils: Pupils are equal, round, and reactive to light.  Neck:     Thyroid: No thyroid mass or thyromegaly.     Vascular: No carotid bruit.     Trachea: Trachea normal.  Cardiovascular:     Rate and Rhythm: Normal rate and regular rhythm.     Pulses: Normal pulses.     Heart sounds: Normal heart sounds, S1  normal and S2 normal. No murmur. No friction rub. No gallop.   Pulmonary:     Effort: Pulmonary effort is normal. No tachypnea or respiratory distress.     Breath sounds: Normal breath sounds. No decreased breath sounds, wheezing, rhonchi or rales.  Abdominal:     General: Bowel sounds are normal.     Palpations: Abdomen is soft.     Tenderness: There is abdominal tenderness in the epigastric area and suprapubic area. There is no right CVA tenderness or left CVA tenderness.  Musculoskeletal:     Cervical back: Normal range of motion and neck supple.  Skin:    General: Skin is warm and dry.     Findings: No rash.  Neurological:     Mental Status: She is alert.  Psychiatric:        Mood and Affect: Mood is not anxious or depressed.        Speech: Speech normal.        Behavior: Behavior normal. Behavior is cooperative.        Thought Content: Thought content normal.        Judgment: Judgment normal.      Assessment and Plan   Acute gastritis  Avoid triggers. Treat with PPI. Push fluids, bland diet.     Eliezer Lofts, MD

## 2019-08-03 NOTE — Assessment & Plan Note (Signed)
Avoid triggers. Treat with PPI. Push fluids, bland diet.

## 2019-08-03 NOTE — Patient Instructions (Addendum)
Hold diclofenac, aleve, ibuprofen. Can use tylenol for pain.  Start Prilosec 2 tab daily x 2-4 weeks  Push water, avoid trigger.  Call if not improving as expected or new fever etc.

## 2019-11-24 ENCOUNTER — Other Ambulatory Visit: Payer: Self-pay

## 2019-11-24 ENCOUNTER — Encounter: Payer: Self-pay | Admitting: Family Medicine

## 2019-11-24 ENCOUNTER — Ambulatory Visit (INDEPENDENT_AMBULATORY_CARE_PROVIDER_SITE_OTHER): Payer: 59 | Admitting: Family Medicine

## 2019-11-24 VITALS — BP 122/82 | HR 78 | Temp 97.8°F | Wt 202.5 lb

## 2019-11-24 DIAGNOSIS — B029 Zoster without complications: Secondary | ICD-10-CM

## 2019-11-24 MED ORDER — VALACYCLOVIR HCL 1 G PO TABS
1000.0000 mg | ORAL_TABLET | Freq: Three times a day (TID) | ORAL | 0 refills | Status: AC
Start: 2019-11-24 — End: 2019-12-01

## 2019-11-24 NOTE — Progress Notes (Signed)
   Subjective:     Kelsey Carlson is a 54 y.o. female presenting for Insect Bite (possibly- x 4 days )     HPI   #Right buttock - large red spot - painful and itchy  - itch has improved - hurts to sit down - also had a hemorrhoid for a few days - which has resolved - no f/c - endorses some nausea, no vomiting - had something crawling on shoulder and killed it - she thought it was a maggot - but also was not wearing her glasses - looked the next day and appeared to have legs - also with some right inguinal lymph nodes  Review of Systems   Social History   Tobacco Use  Smoking Status Former Smoker  . Quit date: 05/20/1994  . Years since quitting: 25.5  Smokeless Tobacco Never Used        Objective:    BP Readings from Last 3 Encounters:  11/24/19 122/82  08/03/19 110/80  04/29/19 118/74   Wt Readings from Last 3 Encounters:  11/24/19 202 lb 8 oz (91.9 kg)  08/03/19 201 lb 4 oz (91.3 kg)  04/29/19 199 lb 4 oz (90.4 kg)    BP 122/82   Pulse 78   Temp 97.8 F (36.6 C) (Temporal)   Wt 202 lb 8 oz (91.9 kg)   LMP 11/05/2019 (Within Days)   SpO2 98%   BMI 34.76 kg/m    Physical Exam Constitutional:      General: She is not in acute distress.    Appearance: She is well-developed. She is not diaphoretic.  HENT:     Right Ear: External ear normal.     Left Ear: External ear normal.  Eyes:     Conjunctiva/sclera: Conjunctivae normal.  Cardiovascular:     Rate and Rhythm: Normal rate.  Pulmonary:     Effort: Pulmonary effort is normal.  Musculoskeletal:     Cervical back: Neck supple.  Lymphadenopathy:     Lower Body: Right inguinal adenopathy present.  Skin:    General: Skin is warm and dry.     Capillary Refill: Capillary refill takes less than 2 seconds.     Comments: Right buttock with vesicles on an erythematous base in clusters.   Neurological:     Mental Status: She is alert. Mental status is at baseline.  Psychiatric:        Mood and  Affect: Mood normal.        Behavior: Behavior normal.           Assessment & Plan:   Problem List Items Addressed This Visit    None    Visit Diagnoses    Herpes zoster without complication    -  Primary   Relevant Medications   valACYclovir (VALTREX) 1000 MG tablet     Only noticed the rash today, but itching/discomfort x 1-2 days. Reasonable to start treatment. Watch and wait for lymph nodes and return if they do not resolve with rash resolving. Discussed contact precautions.   Return if symptoms worsen or fail to improve.  Lesleigh Noe, MD  This visit occurred during the SARS-CoV-2 public health emergency.  Safety protocols were in place, including screening questions prior to the visit, additional usage of staff PPE, and extensive cleaning of exam room while observing appropriate contact time as indicated for disinfecting solutions.

## 2019-11-24 NOTE — Patient Instructions (Signed)
#  Shingles - Take the medication - if swelling in lower leg is not improved return to see Dr. Diona Browner after you finish the medicine

## 2019-12-30 ENCOUNTER — Telehealth: Payer: Self-pay | Admitting: Family Medicine

## 2019-12-30 NOTE — Telephone Encounter (Signed)
Patient left a message regarding covid vaccine. Patient was Dx with shingles in July. Patient wanted to the time frame to get the covid vaccine.  Patient wanted to when and which vaccine would Dr Diona Browner reccommended.  Please advise.

## 2019-12-31 NOTE — Telephone Encounter (Signed)
Okay to get COVID vaccine now. I recommend [employer] or [employer]

## 2020-01-03 NOTE — Telephone Encounter (Signed)
Kelsey Carlson notified by Dr. Diona Browner via her MyChart that it is okay to get Covid vaccine now.  Patient has reviewed and commented on message.

## 2020-03-03 ENCOUNTER — Other Ambulatory Visit: Payer: Self-pay

## 2020-03-03 ENCOUNTER — Encounter: Payer: Self-pay | Admitting: Family Medicine

## 2020-03-03 ENCOUNTER — Ambulatory Visit (INDEPENDENT_AMBULATORY_CARE_PROVIDER_SITE_OTHER): Payer: 59 | Admitting: Family Medicine

## 2020-03-03 VITALS — BP 114/66 | HR 76 | Temp 97.1°F | Ht 64.0 in | Wt 203.0 lb

## 2020-03-03 DIAGNOSIS — L089 Local infection of the skin and subcutaneous tissue, unspecified: Secondary | ICD-10-CM | POA: Diagnosis not present

## 2020-03-03 DIAGNOSIS — L723 Sebaceous cyst: Secondary | ICD-10-CM

## 2020-03-03 MED ORDER — DICLOFENAC SODIUM 75 MG PO TBEC: 75.0000 mg | DELAYED_RELEASE_TABLET | Freq: Two times a day (BID) | ORAL | 0 refills | Status: DC

## 2020-03-03 MED ORDER — CEPHALEXIN 500 MG PO CAPS: 500.0000 mg | ORAL_CAPSULE | Freq: Three times a day (TID) | ORAL | 0 refills | Status: DC

## 2020-03-03 NOTE — Progress Notes (Signed)
Chief Complaint  Patient presents with  . Cyst on Back    Recurrent cyst on back. Says she can ususally pop it and it goes away. This time is has not gone away..    History of Present Illness: HPI   54 year old female presents with recurrent cyst on left lower back.  She reports in last 10 days she noted increase in size of cyst.   She has expressed bloody clear liquid, small amount of pus.  During day small amount discharge.  Area is sore, hot and reddish purple.   No fever, feels well otherwise. No N/V.  She has been using heat on low back. Not applying cream.  No past treatment other than home expression of discharge and it usaully goes away.   No contact MRSA, healthcare.    This visit occurred during the SARS-CoV-2 public health emergency.  Safety protocols were in place, including screening questions prior to the visit, additional usage of staff PPE, and extensive cleaning of exam room while observing appropriate contact time as indicated for disinfecting solutions.   COVID 19 screen:  No recent travel or known exposure to COVID19 The patient denies respiratory symptoms of COVID 19 at this time. The importance of social distancing was discussed today.     Review of Systems  Constitutional: Negative for chills and fever.  HENT: Negative for congestion and ear pain.   Eyes: Negative for pain and redness.  Respiratory: Negative for cough and shortness of breath.   Cardiovascular: Negative for chest pain, palpitations and leg swelling.  Gastrointestinal: Negative for abdominal pain, blood in stool, constipation, diarrhea, nausea and vomiting.  Genitourinary: Negative for dysuria.  Musculoskeletal: Negative for falls and myalgias.  Skin: Positive for rash.  Neurological: Negative for dizziness.  Psychiatric/Behavioral: Negative for depression. The patient is not nervous/anxious.       Past Medical History:  Diagnosis Date  . Arthritis   . Gallstones   .  History of kidney stones   . Lumbar back pain 06/28/2013  . Nephrolithiasis   . Urolithiasis   . Wears glasses     reports that she quit smoking about 25 years ago. She has never used smokeless tobacco. She reports current alcohol use. She reports that she does not use drugs.   Current Outpatient Medications:  .  Cholecalciferol (VITAMIN D-3) 125 MCG (5000 UT) TABS, Take 1 tablet by mouth daily., Disp: , Rfl:  .  diclofenac (VOLTAREN) 75 MG EC tablet, Take 1 tablet (75 mg total) by mouth 2 (two) times daily., Disp: 30 tablet, Rfl: 0   Observations/Objective: Blood pressure 114/66, pulse 76, temperature (!) 97.1 F (36.2 C), height 5\' 4"  (1.626 m), weight 203 lb (92.1 kg), SpO2 98 %.  Physical Exam Constitutional:      General: She is not in acute distress.    Appearance: Normal appearance. She is well-developed. She is not ill-appearing or toxic-appearing.  HENT:     Head: Normocephalic.     Right Ear: Hearing, tympanic membrane, ear canal and external ear normal. Tympanic membrane is not erythematous, retracted or bulging.     Left Ear: Hearing, tympanic membrane, ear canal and external ear normal. Tympanic membrane is not erythematous, retracted or bulging.     Nose: No mucosal edema or rhinorrhea.     Right Sinus: No maxillary sinus tenderness or frontal sinus tenderness.     Left Sinus: No maxillary sinus tenderness or frontal sinus tenderness.     Mouth/Throat:  Pharynx: Uvula midline.  Eyes:     General: Lids are normal. Lids are everted, no foreign bodies appreciated.     Conjunctiva/sclera: Conjunctivae normal.     Pupils: Pupils are equal, round, and reactive to light.  Neck:     Thyroid: No thyroid mass or thyromegaly.     Vascular: No carotid bruit.     Trachea: Trachea normal.  Cardiovascular:     Rate and Rhythm: Normal rate and regular rhythm.     Pulses: Normal pulses.     Heart sounds: Normal heart sounds, S1 normal and S2 normal. No murmur heard.  No  friction rub. No gallop.   Pulmonary:     Effort: Pulmonary effort is normal. No tachypnea or respiratory distress.     Breath sounds: Normal breath sounds. No decreased breath sounds, wheezing, rhonchi or rales.  Abdominal:     General: Bowel sounds are normal.     Palpations: Abdomen is soft.     Tenderness: There is no abdominal tenderness.  Musculoskeletal:     Cervical back: Normal range of motion and neck supple.    Skin:    General: Skin is warm and dry.     Findings: Lesion present. No rash.     Comments: 3 cm x 4 cm purplish red lesion with central pore draining, mild ttp See photo   Neurological:     Mental Status: She is alert.  Psychiatric:        Mood and Affect: Mood is not anxious or depressed.        Speech: Speech normal.        Behavior: Behavior normal. Behavior is cooperative.        Thought Content: Thought content normal.        Judgment: Judgment normal.      Assessment and Plan Infected sebaceous cyst Pt wishes to hold on I and D.  Send wound culture.  treat with warm compresses and start keflex TID x 7 days.  Return precautions given.       Eliezer Lofts, MD

## 2020-03-03 NOTE — Patient Instructions (Signed)
Warm compresses 3-4 times daily.  Complete antibiotics.  Call if redness spreading or if not tolerating antibiotics.

## 2020-03-03 NOTE — Assessment & Plan Note (Addendum)
Pt wishes to hold on I and D.  Send wound culture.  treat with warm compresses and start keflex TID x 7 days.  Return precautions given.

## 2020-03-04 MED ORDER — DICLOFENAC SODIUM 75 MG PO TBEC: 75.0000 mg | DELAYED_RELEASE_TABLET | Freq: Two times a day (BID) | ORAL | 0 refills | Status: DC

## 2020-03-04 MED ORDER — CEPHALEXIN 500 MG PO CAPS: 500.0000 mg | ORAL_CAPSULE | Freq: Three times a day (TID) | ORAL | 0 refills | Status: DC

## 2020-03-04 NOTE — Addendum Note (Signed)
Addended by: Pilar Grammes on: 03/04/2020 11:34 AM   Modules accepted: Orders

## 2020-03-05 LAB — WOUND CULTURE: MICRO NUMBER:: 11077621

## 2020-03-06 ENCOUNTER — Telehealth: Payer: Self-pay

## 2020-03-06 LAB — WOUND CULTURE
GRAM STAIN:: NONE SEEN
SPECIMEN QUALITY:: ADEQUATE

## 2020-03-06 NOTE — Telephone Encounter (Signed)
Patient Name: Kelsey Carlson Gender: Female DOB: Sep 05, 1965 Age: 54 Y 23 M 7 D Return Phone Number: [phone] (Primary) Address: City/State/Zip: [place] [address] Client [facility] Night - Client Client Site [facility] Eliezer Lofts - MD Contact Type Call Who Is Calling Patient / Member / Family / Caregiver Call Type Triage / Clinical Relationship To Patient Self Return Phone Number [phone] (Primary) Chief Complaint Prescription Refill or Medication Request (non symptomatic) Reason for Call Symptomatic / Request for [place] states she saw Dr. Diona Browner this morning. The pharmacy did not get the electronic prescription that was sent. They said there may have been a Emergency planning/management officer. It is a new prescription for antibiotic. Additional Comment Can it be resubmitted Translation No Nurse Assessment Nurse: Laurann Montana, RN, Fransico Meadow Date/Time Eilene Ghazi Time): 03/03/2020 9:07:05 PM Please select the assessment type ---RX called in but not at pharm Additional Documentation ---Caller states she saw MD and got Rx for atb. Caller states that pharmacy told her they have been having trouble from their electronic faxes. Caller requesting medications be resubmitted. Caller states no new symptoms since being seen by MD. Document the name of the medication. ---unknown Pharmacy name and phone number. ---unknown Has the office closed within the last 30 minutes? ---No Does the client directives allow for assistance with medications after hours? ---No Nurse: Laurann Montana, RN, Fransico Meadow Date/Time Eilene Ghazi Time): 03/03/2020 9:10:05 PM Confirm and document reason for call. If symptomatic, describe symptoms. ---Caller states medication wasn't at pharmacy that she rcv'd Rx for today at MD office. Does the patient have any new or worsening symptoms? ---No Disp. Time Eilene Ghazi Time) Disposition Final  User 03/03/2020 9:11:05 PM Clinical Call Yes Laurann Montana, RN, Fransico Meadow

## 2020-03-06 NOTE — Telephone Encounter (Signed)
Patients medication was confirmed received by pharmacy.

## 2020-03-06 NOTE — Telephone Encounter (Signed)
Please see message and advise 

## 2020-03-07 ENCOUNTER — Ambulatory Visit (INDEPENDENT_AMBULATORY_CARE_PROVIDER_SITE_OTHER): Payer: 59 | Admitting: Family Medicine

## 2020-03-07 ENCOUNTER — Encounter: Payer: Self-pay | Admitting: Family Medicine

## 2020-03-07 VITALS — BP 118/88 | HR 74 | Temp 96.9°F | Ht 64.02 in | Wt 203.0 lb

## 2020-03-07 DIAGNOSIS — L089 Local infection of the skin and subcutaneous tissue, unspecified: Secondary | ICD-10-CM

## 2020-03-07 DIAGNOSIS — L723 Sebaceous cyst: Secondary | ICD-10-CM | POA: Diagnosis not present

## 2020-03-07 MED ORDER — DOXYCYCLINE HYCLATE 100 MG PO TABS: 100.0000 mg | ORAL_TABLET | Freq: Two times a day (BID) | ORAL | 0 refills | Status: DC

## 2020-03-07 NOTE — Progress Notes (Signed)
Chief Complaint  Patient presents with  . Cyst    pt c/o infected cyst on upper left back x2week. Swelling, itching, and tender to touch.    History of Present Illness: HPI  Infected sebaceous cyst.. rosining .. increased size and pain.  No improvement in 3 days with keflex and warm compresses.   This visit occurred during the SARS-CoV-2 public health emergency.  Safety protocols were in place, including screening questions prior to the visit, additional usage of staff PPE, and extensive cleaning of exam room while observing appropriate contact time as indicated for disinfecting solutions.   COVID 19 screen:  No recent travel or known exposure to COVID19 The patient denies respiratory symptoms of COVID 19 at this time. The importance of social distancing was discussed today.     Review of Systems  Constitutional: Negative for chills and fever.  HENT: Negative for congestion and ear pain.   Eyes: Negative for pain and redness.  Respiratory: Negative for cough and shortness of breath.   Cardiovascular: Negative for chest pain, palpitations and leg swelling.  Gastrointestinal: Negative for abdominal pain, blood in stool, constipation, diarrhea, nausea and vomiting.  Genitourinary: Negative for dysuria.  Musculoskeletal: Negative for falls and myalgias.  Skin: Negative for rash.  Neurological: Negative for dizziness.  Psychiatric/Behavioral: Negative for depression. The patient is not nervous/anxious.       Past Medical History:  Diagnosis Date  . Arthritis   . Gallstones   . History of kidney stones   . Lumbar back pain 06/28/2013  . Nephrolithiasis   . Urolithiasis   . Wears glasses     reports that she quit smoking about 25 years ago. She has never used smokeless tobacco. She reports current alcohol use. She reports that she does not use drugs.   Current Outpatient Medications:  .  cephALEXin (KEFLEX) 500 MG capsule, Take 1 capsule (500 mg total) by mouth 3 (three)  times daily., Disp: 21 capsule, Rfl: 0 .  Cholecalciferol (VITAMIN D-3) 125 MCG (5000 UT) TABS, Take 1 tablet by mouth daily., Disp: , Rfl:  .  diclofenac (VOLTAREN) 75 MG EC tablet, Take 1 tablet (75 mg total) by mouth 2 (two) times daily., Disp: 30 tablet, Rfl: 0   Observations/Objective: Blood pressure 118/88, pulse 74, temperature (!) 96.9 F (36.1 C), height 5' 4.02" (1.626 m), weight 203 lb (92.1 kg), SpO2 99 %.  Physical Exam Constitutional:      General: She is not in acute distress.Vital signs are normal.     Appearance: Normal appearance. She is well-developed and well-nourished. She is not ill-appearing or toxic-appearing.  HENT:     Head: Normocephalic.     Right Ear: Hearing, tympanic membrane, ear canal and external ear normal. Tympanic membrane is not erythematous, retracted or bulging.     Left Ear: Hearing, tympanic membrane, ear canal and external ear normal. Tympanic membrane is not erythematous, retracted or bulging.     Nose: No mucosal edema or rhinorrhea.     Right Sinus: No maxillary sinus tenderness or frontal sinus tenderness.     Left Sinus: No maxillary sinus tenderness or frontal sinus tenderness.     Mouth/Throat:     Mouth: Oropharynx is clear and moist and mucous membranes are normal.     Pharynx: Uvula midline.  Eyes:     General: Lids are normal. Lids are everted, no foreign bodies appreciated.     Extraocular Movements: EOM normal.     Conjunctiva/sclera: Conjunctivae normal.  Pupils: Pupils are equal, round, and reactive to light.  Neck:     Thyroid: No thyroid mass or thyromegaly.     Vascular: No carotid bruit.     Trachea: Trachea normal.  Cardiovascular:     Rate and Rhythm: Normal rate and regular rhythm.     Pulses: Normal pulses and intact distal pulses.     Heart sounds: Normal heart sounds, S1 normal and S2 normal. No murmur heard. No friction rub. No gallop.   Pulmonary:     Effort: Pulmonary effort is normal. No tachypnea or  respiratory distress.     Breath sounds: Normal breath sounds. No decreased breath sounds, wheezing, rhonchi or rales.  Abdominal:     General: Bowel sounds are normal.     Palpations: Abdomen is soft.     Tenderness: There is no abdominal tenderness.  Musculoskeletal:     Cervical back: Normal range of motion and neck supple.  Skin:    General: Skin is warm, dry and intact.     Findings: No rash.     Comments: Left upper back red inflammed cystic lesion with central pore  Neurological:     Mental Status: She is alert.  Psychiatric:        Mood and Affect: Mood is not anxious or depressed.        Speech: Speech normal.        Behavior: Behavior normal. Behavior is cooperative.        Thought Content: Thought content normal.        Cognition and Memory: Cognition and memory normal.        Judgment: Judgment normal.      Assessment and Plan   Procedure Note : I&D  Meds, vitals, and allergies reviewed.   Indication: suspect abscess  Pt complaints of: erythema, pain, swelling  Location: left upper back  Size: 3 cm  Informed consent obtained.  Pt aware of risks not limited to but including infection, bleeding, damage to near by organs.  Prep: etoh/betadine  Anesthesia: 1%lidocaine with epi, good effect  Incision made with #11 blade  Wound explored and loculations removed  Wound packed with iodoform gauze  Tolerated well  Routine postprocedure instructions d/w pt- remove packing in 24-48h, keep area clean and bandaged, follow up if concerns/spreading erythema/pain. D  Problem List Items Addressed This Visit    Infected sebaceous cyst - Primary    Add doxycycline to other antibiotics. Complete both courses.  Remove packing in 24 hours, wash with warm soapy water. Keep area covered.  Continue warm water soaks.  can use tylenol for pain.  Call if redness spreading or new fever.            Eliezer Lofts, MD

## 2020-03-07 NOTE — Patient Instructions (Addendum)
Add doxycycline to other antibiotics. Complete both courses.  Remove packing in 24 hours, wash with warm soapy water. Keep area covered.  Continue warm water soaks.  can use tylenol for pain.  Call if redness spreading or new fever.

## 2020-04-04 ENCOUNTER — Other Ambulatory Visit: Payer: Self-pay | Admitting: Obstetrics and Gynecology

## 2020-04-04 DIAGNOSIS — R928 Other abnormal and inconclusive findings on diagnostic imaging of breast: Secondary | ICD-10-CM

## 2020-05-08 ENCOUNTER — Ambulatory Visit
Admission: RE | Admit: 2020-05-08 | Discharge: 2020-05-08 | Disposition: A | Discharge status: Home / Self Care | Payer: 59 | Source: Ambulatory Visit | Attending: Obstetrics and Gynecology | Admitting: Obstetrics and Gynecology

## 2020-05-08 ENCOUNTER — Other Ambulatory Visit: Payer: Self-pay

## 2020-05-08 DIAGNOSIS — R928 Other abnormal and inconclusive findings on diagnostic imaging of breast: Secondary | ICD-10-CM

## 2020-05-08 LAB — HM MAMMOGRAPHY

## 2020-05-15 NOTE — Assessment & Plan Note (Signed)
Add doxycycline to other antibiotics. Complete both courses.  Remove packing in 24 hours, wash with warm soapy water. Keep area covered.  Continue warm water soaks.  can use tylenol for pain.  Call if redness spreading or new fever.    

## 2020-05-29 ENCOUNTER — Telehealth: Payer: Self-pay | Admitting: Family Medicine

## 2020-05-29 DIAGNOSIS — Z13220 Encounter for screening for lipoid disorders: Secondary | ICD-10-CM

## 2020-05-29 NOTE — Telephone Encounter (Signed)
-----   Message from Ellamae Sia sent at 05/22/2020 12:11 PM EST ----- Regarding: Lab orders for Monday, 1.17.22 Patient is scheduled for CPX labs, please order future labs, Thanks , Karna Christmas

## 2020-06-05 ENCOUNTER — Other Ambulatory Visit: Payer: 59

## 2020-06-07 ENCOUNTER — Other Ambulatory Visit: Payer: Self-pay

## 2020-06-08 ENCOUNTER — Other Ambulatory Visit (INDEPENDENT_AMBULATORY_CARE_PROVIDER_SITE_OTHER): Payer: Managed Care, Other (non HMO)

## 2020-06-08 ENCOUNTER — Other Ambulatory Visit: Payer: Self-pay

## 2020-06-08 DIAGNOSIS — Z13220 Encounter for screening for lipoid disorders: Secondary | ICD-10-CM | POA: Diagnosis not present

## 2020-06-08 LAB — COMPREHENSIVE METABOLIC PANEL
ALT: 24 U/L (ref 0–35)
AST: 19 U/L (ref 0–37)
Albumin: 4.2 g/dL (ref 3.5–5.2)
Alkaline Phosphatase: 78 U/L (ref 39–117)
BUN: 12 mg/dL (ref 6–23)
CO2: 28 mEq/L (ref 19–32)
Calcium: 9.3 mg/dL (ref 8.4–10.5)
Chloride: 105 mEq/L (ref 96–112)
Creatinine, Ser: 0.91 mg/dL (ref 0.40–1.20)
GFR: 71.89 mL/min (ref >60.00)
Glucose, Bld: 90 mg/dL (ref 70–99)
Potassium: 4.2 mEq/L (ref 3.5–5.1)
Sodium: 138 mEq/L (ref 135–145)
Total Bilirubin: 0.6 mg/dL (ref 0.2–1.2)
Total Protein: 7.1 g/dL (ref 6.0–8.3)

## 2020-06-08 LAB — LIPID PANEL
Cholesterol: 222 mg/dL — ABNORMAL HIGH (ref 0–200)
HDL: 54.7 mg/dL (ref >39.00)
LDL Cholesterol: 137 mg/dL — ABNORMAL HIGH (ref 0–99)
NonHDL: 167.6
Total CHOL/HDL Ratio: 4
Triglycerides: 154 mg/dL — ABNORMAL HIGH (ref 0.0–149.0)
VLDL: 30.8 mg/dL (ref 0.0–40.0)

## 2020-06-08 NOTE — Progress Notes (Signed)
No critical labs need to be addressed urgently. We will discuss labs in detail at upcoming office visit.   

## 2020-06-09 ENCOUNTER — Ambulatory Visit (INDEPENDENT_AMBULATORY_CARE_PROVIDER_SITE_OTHER): Payer: Managed Care, Other (non HMO) | Admitting: Family Medicine

## 2020-06-09 ENCOUNTER — Other Ambulatory Visit: Payer: Self-pay

## 2020-06-09 VITALS — BP 130/82 | HR 80 | Temp 96.7°F | Ht 64.25 in | Wt 204.5 lb

## 2020-06-09 DIAGNOSIS — H9313 Tinnitus, bilateral: Secondary | ICD-10-CM | POA: Diagnosis not present

## 2020-06-09 DIAGNOSIS — Z0000 Encounter for general adult medical examination without abnormal findings: Secondary | ICD-10-CM

## 2020-06-09 DIAGNOSIS — F3340 Major depressive disorder, recurrent, in remission, unspecified: Secondary | ICD-10-CM

## 2020-06-09 DIAGNOSIS — Z6834 Body mass index (BMI) 34.0-34.9, adult: Secondary | ICD-10-CM

## 2020-06-09 DIAGNOSIS — E6609 Other obesity due to excess calories: Secondary | ICD-10-CM

## 2020-06-09 NOTE — Assessment & Plan Note (Signed)
Stable ,chronic  On no medication. 

## 2020-06-09 NOTE — Assessment & Plan Note (Signed)
Encouraged exercise, weight loss, healthy eating habits. ? ?

## 2020-06-09 NOTE — Assessment & Plan Note (Signed)
Not taking  NSAIDs regularly.  Some hearing loss... eval with hearing test in office  No vertigo.. doubt Menieres.

## 2020-06-09 NOTE — Patient Instructions (Signed)
Increase exercise and activity.

## 2020-06-09 NOTE — Progress Notes (Signed)
Patient ID: Kelsey Carlson, female    DOB: 01/18/1966, 55 y.o.   MRN: [id number]  This visit was conducted in person.  BP 130/82   Pulse 80   Temp (!) 96.7 F (35.9 C) (Temporal)   Ht 5' 4.25" (1.632 m)   Wt 204 lb 8 oz (92.8 kg)   LMP 05/26/2020 (Exact Date)   SpO2 98%   BMI 34.83 kg/m    CC:  Chief Complaint  Patient presents with  . Annual Exam  . Tinnitus  . Menopause    Questions     Subjective:   HPI: Kelsey Carlson is a 55 y.o. female presenting on 06/09/2020 for Annual Exam, Tinnitus, and Menopause (Questions )    The patient is here for annual wellness exam and preventative care.    MDD, recurrent in remission: stable. [place] Visit from 03/07/2020 in [place] at [facility] Total Score 0      Chronic low back and hip pain:  Improved on diclofenac  Prn none recently   1997 lumbar laminectomy. Dr. Mammie Lorenzo (ret)   tinnitus: Ongoing for years but worseing in last  years.. bilateral pinging and some decrease hearing    Menopausal syndrome?:  irregular menses, but still about every other month. Not hot flashes, she is having some mood swings.   Diet: moderate  Exercise: none Labs reviewed in detail with patient The 10-year ASCVD risk score Mikey Bussing DC Brooke Bonito., et al., 2013) is: 1.9%   Values used to calculate the score:     Age: 49 years     Sex: Female     Is Non-Hispanic African American: No     Diabetic: No     Tobacco smoker: No     Systolic Blood Pressure: 142 mmHg     Is BP treated: No     HDL Cholesterol: 54.7 mg/dL     Total Cholesterol: 222 mg/dL  Wt Readings from Last 3 Encounters:  06/09/20 204 lb 8 oz (92.8 kg)  03/07/20 203 lb (92.1 kg)  03/03/20 203 lb (92.1 kg)     Relevant past medical, surgical, family and social history reviewed and updated as indicated. Interim medical history since our last visit reviewed. Allergies and medications reviewed and updated. Outpatient Medications Prior  to Visit  Medication Sig Dispense Refill  . Cholecalciferol (VITAMIN D-3) 125 MCG (5000 UT) TABS Take 1 tablet by mouth daily.    . diclofenac (VOLTAREN) 75 MG EC tablet Take 1 tablet (75 mg total) by mouth 2 (two) times daily. (Patient taking differently: Take 75 mg by mouth as needed.) 30 tablet 0  . omeprazole (PRILOSEC) 10 MG capsule Take 10 mg by mouth as needed.    . cephALEXin (KEFLEX) 500 MG capsule Take 1 capsule (500 mg total) by mouth 3 (three) times daily. 21 capsule 0  . doxycycline (VIBRA-TABS) 100 MG tablet Take 1 tablet (100 mg total) by mouth 2 (two) times daily. 20 tablet 0   No facility-administered medications prior to visit.     Per HPI unless specifically indicated in ROS section below Review of Systems  Constitutional: Negative for fatigue and fever.  HENT: Positive for hearing loss. Negative for congestion.   Eyes: Negative for pain.  Respiratory: Negative for cough and shortness of breath.   Cardiovascular: Negative for chest pain, palpitations and leg swelling.  Gastrointestinal: Negative for abdominal pain.  Genitourinary: Negative for dysuria and vaginal bleeding.  Musculoskeletal: Negative for back pain.  Neurological: Negative for syncope, light-headedness and headaches.  Psychiatric/Behavioral: Negative for dysphoric mood.   Objective:  BP 130/82   Pulse 80   Temp (!) 96.7 F (35.9 C) (Temporal)   Ht 5' 4.25" (1.632 m)   Wt 204 lb 8 oz (92.8 kg)   LMP 05/26/2020 (Exact Date)   SpO2 98%   BMI 34.83 kg/m   Wt Readings from Last 3 Encounters:  06/09/20 204 lb 8 oz (92.8 kg)  03/07/20 203 lb (92.1 kg)  03/03/20 203 lb (92.1 kg)      Physical Exam Constitutional:      General: She is not in acute distress.Vital signs are normal.     Appearance: Normal appearance. She is well-developed and well-nourished. She is obese. She is not ill-appearing or toxic-appearing.  HENT:     Head: Normocephalic.     Right Ear: Hearing, tympanic membrane, ear  canal and external ear normal. Tympanic membrane is not erythematous, retracted or bulging.     Left Ear: Hearing, tympanic membrane, ear canal and external ear normal. Tympanic membrane is not erythematous, retracted or bulging.     Nose: No mucosal edema or rhinorrhea.     Right Sinus: No maxillary sinus tenderness or frontal sinus tenderness.     Left Sinus: No maxillary sinus tenderness or frontal sinus tenderness.     Mouth/Throat:     Mouth: Oropharynx is clear and moist and mucous membranes are normal.     Pharynx: Uvula midline.  Eyes:     General: Lids are normal. Lids are everted, no foreign bodies appreciated.     Extraocular Movements: EOM normal.     Conjunctiva/sclera: Conjunctivae normal.     Pupils: Pupils are equal, round, and reactive to light.  Neck:     Thyroid: No thyroid mass or thyromegaly.     Vascular: No carotid bruit.     Trachea: Trachea normal.  Cardiovascular:     Rate and Rhythm: Normal rate and regular rhythm.     Pulses: Normal pulses and intact distal pulses.     Heart sounds: Normal heart sounds, S1 normal and S2 normal. No murmur heard. No friction rub. No gallop.   Pulmonary:     Effort: Pulmonary effort is normal. No tachypnea or respiratory distress.     Breath sounds: Normal breath sounds. No decreased breath sounds, wheezing, rhonchi or rales.  Abdominal:     General: Bowel sounds are normal.     Palpations: Abdomen is soft.     Tenderness: There is no abdominal tenderness.  Musculoskeletal:     Cervical back: Normal range of motion and neck supple.  Skin:    General: Skin is warm, dry and intact.     Findings: No rash.  Neurological:     Mental Status: She is alert.  Psychiatric:        Mood and Affect: Mood is not anxious or depressed.        Speech: Speech normal.        Behavior: Behavior normal. Behavior is cooperative.        Thought Content: Thought content normal.        Cognition and Memory: Cognition and memory normal.         Judgment: Judgment normal.       Results for orders placed or performed in visit on 06/08/20  Comprehensive metabolic panel  Result Value Ref Range   Sodium 138 135 - 145 mEq/L   Potassium 4.2 3.5 - 5.1  mEq/L   Chloride 105 96 - 112 mEq/L   CO2 28 19 - 32 mEq/L   Glucose, Bld 90 70 - 99 mg/dL   BUN 12 6 - 23 mg/dL   Creatinine, Ser 0.91 0.40 - 1.20 mg/dL   Total Bilirubin 0.6 0.2 - 1.2 mg/dL   Alkaline Phosphatase 78 39 - 117 U/L   AST 19 0 - 37 U/L   ALT 24 0 - 35 U/L   Total Protein 7.1 6.0 - 8.3 g/dL   Albumin 4.2 3.5 - 5.2 g/dL   GFR 71.89 >60.00 mL/min   Calcium 9.3 8.4 - 10.5 mg/dL  Lipid panel  Result Value Ref Range   Cholesterol 222 (H) 0 - 200 mg/dL   Triglycerides 154.0 (H) 0.0 - 149.0 mg/dL   HDL 54.70 >39.00 mg/dL   VLDL 30.8 0.0 - 40.0 mg/dL   LDL Cholesterol 137 (H) 0 - 99 mg/dL   Total CHOL/HDL Ratio 4    NonHDL 167.60     This visit occurred during the SARS-CoV-2 public health emergency.  Safety protocols were in place, including screening questions prior to the visit, additional usage of staff PPE, and extensive cleaning of exam room while observing appropriate contact time as indicated for disinfecting solutions.   COVID 19 screen:  No recent travel or known exposure to COVID19 The patient denies respiratory symptoms of COVID 19 at this time. The importance of social distancing was discussed today.   Assessment and Plan   The patient's preventative maintenance and recommended screening tests for an annual wellness exam were reviewed in full today. Brought up to date unless services declined.  Counselled on the importance of diet, exercise, and its role in overall health and mortality. The patient's FH and SH was reviewed, including their home life, tobacco status, and drug and alcohol status.   Vaccines:Td uptodate, due for flu...refused, COVID x2, encouraged booster Pap/DVE:Per GYN, last seen in 03/2020 Dr. Melonie [place] Mammo:Per GYN.. Mother  with breast cancer died age 36. Plans BRCA1 and 2 testing  Done 03/2020 Colon:2016 repeat in 10 years, Dr. Collene Mares Smoking Status:none ETOH/ drug GGE:ZMOQ/HUTM   Problem List Items Addressed This Visit    Class 1 obesity due to excess calories with body mass index (BMI) of 34.0 to 34.9 in adult    Encouraged exercise, weight loss, healthy eating habits.       MDD (recurrent major depressive disorder) in remission ([facility]) (Chronic)    Stable, chronic.  On no medication.         Tinnitus of both ears    Not taking  NSAIDs regularly.  Some hearing loss... eval with hearing test in office  No vertigo.. doubt Menieres.       Other Visit Diagnoses    Routine general medical examination at a health care facility    -  Primary       Hearing Screening   125Hz  250Hz  500Hz  1000Hz  2000Hz  3000Hz  4000Hz  6000Hz  8000Hz   Right ear:  20 20 20 20   0    Left ear:  20 25 20  40  0        Eliezer Lofts, MD

## 2020-06-19 ENCOUNTER — Encounter: Payer: Self-pay | Admitting: Family Medicine

## 2021-10-05 ENCOUNTER — Ambulatory Visit (INDEPENDENT_AMBULATORY_CARE_PROVIDER_SITE_OTHER): Payer: Managed Care, Other (non HMO) | Admitting: Family Medicine

## 2021-10-05 VITALS — BP 114/82 | HR 72 | Ht 64.5 in | Wt 187.8 lb

## 2021-10-05 DIAGNOSIS — Z0000 Encounter for general adult medical examination without abnormal findings: Secondary | ICD-10-CM

## 2021-10-05 NOTE — Progress Notes (Addendum)
Please contact patient to reschedule the appointment.  She left prior to being seen given wait on MD.

## 2021-10-05 NOTE — Progress Notes (Signed)
CPE rescheduled to 10/11/2021 at 4:00 pm.

## 2021-10-11 ENCOUNTER — Encounter: Payer: Self-pay | Admitting: Family Medicine

## 2021-10-11 ENCOUNTER — Ambulatory Visit (INDEPENDENT_AMBULATORY_CARE_PROVIDER_SITE_OTHER): Payer: Managed Care, Other (non HMO) | Admitting: Family Medicine

## 2021-10-11 VITALS — BP 116/80 | HR 78 | Temp 98.1°F | Resp 16 | Ht 64.0 in | Wt 185.5 lb

## 2021-10-11 DIAGNOSIS — F3340 Major depressive disorder, recurrent, in remission, unspecified: Secondary | ICD-10-CM | POA: Diagnosis not present

## 2021-10-11 DIAGNOSIS — Z13220 Encounter for screening for lipoid disorders: Secondary | ICD-10-CM

## 2021-10-11 DIAGNOSIS — Z0000 Encounter for general adult medical examination without abnormal findings: Secondary | ICD-10-CM

## 2021-10-11 NOTE — Progress Notes (Signed)
Patient ID: Kelsey Carlson, female    DOB: [birth year], 56 y.o.   MRN: [id number]  This visit was conducted in person.  BP 116/80   Pulse 78   Temp 98.1 F (36.7 C)   Resp 16   Ht _0  (1.626 m)   Wt 185 lb 8 oz (84.1 kg)   LMP 03/03/2021   SpO2 98%   BMI 31.84 kg/m    CC: Chief Complaint  Patient presents with   Annual Exam     HPI: Kelsey Carlson is a 56 y.o. female presenting on 10/11/2021 for Annual Exam She is doing well overall with no new concerns.   Diet: salads, water  Exercise: minimal.    Body mass index is 31.84 kg/m.  Wt Readings from Last 3 Encounters:  10/11/21 185 lb 8 oz (84.1 kg)  10/05/21 187 lb 12.8 oz (85.2 kg)  06/09/20 204 lb 8 oz (92.8 kg)   GERD: PPI as need.  Relevant past medical, surgical, family and social history reviewed and updated as indicated. Interim medical history since our last visit reviewed. Allergies and medications reviewed and updated. Outpatient Medications Prior to Visit  Medication Sig Dispense Refill   Cholecalciferol (VITAMIN D-3) 125 MCG (5000 UT) TABS Take 1 tablet by mouth daily.     diclofenac (VOLTAREN) 75 MG EC tablet Take 1 tablet (75 mg total) by mouth 2 (two) times daily. 30 tablet 0   omeprazole (PRILOSEC) 10 MG capsule Take 10 mg by mouth as needed.     No facility-administered medications prior to visit.     Per HPI unless specifically indicated in ROS section below Review of Systems  Constitutional:  Negative for fatigue and fever.  HENT:  Negative for congestion.   Eyes:  Negative for pain.  Respiratory:  Negative for cough and shortness of breath.   Cardiovascular:  Negative for chest pain, palpitations and leg swelling.  Gastrointestinal:  Negative for abdominal pain.  Genitourinary:  Negative for dysuria and vaginal bleeding.  Musculoskeletal:  Negative for back pain.  Neurological:  Negative for syncope, light-headedness and headaches.  Psychiatric/Behavioral:  Negative for  dysphoric mood.   Objective:  BP 116/80   Pulse 78   Temp 98.1 F (36.7 C)   Resp 16   Ht _1  (1.626 m)   Wt 185 lb 8 oz (84.1 kg)   LMP 03/03/2021   SpO2 98%   BMI 31.84 kg/m   Wt Readings from Last 3 Encounters:  10/11/21 185 lb 8 oz (84.1 kg)  10/05/21 187 lb 12.8 oz (85.2 kg)  06/09/20 204 lb 8 oz (92.8 kg)      Physical Exam Vitals and nursing note reviewed.  Constitutional:      General: She is not in acute distress.    Appearance: Normal appearance. She is well-developed. She is not ill-appearing or toxic-appearing.  HENT:     Head: Normocephalic.     Right Ear: Hearing, tympanic membrane, ear canal and external ear normal.     Left Ear: Hearing, tympanic membrane, ear canal and external ear normal.     Nose: Nose normal.  Eyes:     General: Lids are normal. Lids are everted, no foreign bodies appreciated.     Conjunctiva/sclera: Conjunctivae normal.     Pupils: Pupils are equal, round, and reactive to light.  Neck:     Thyroid: No thyroid mass or thyromegaly.     Vascular: No carotid bruit.  Trachea: Trachea normal.  Cardiovascular:     Rate and Rhythm: Normal rate and regular rhythm.     Heart sounds: Normal heart sounds, S1 normal and S2 normal. No murmur heard.   No gallop.  Pulmonary:     Effort: Pulmonary effort is normal. No respiratory distress.     Breath sounds: Normal breath sounds. No wheezing, rhonchi or rales.  Abdominal:     General: Bowel sounds are normal. There is no distension or abdominal bruit.     Palpations: Abdomen is soft. There is no fluid wave or mass.     Tenderness: There is no abdominal tenderness. There is no guarding or rebound.     Hernia: No hernia is present.  Musculoskeletal:     Cervical back: Normal range of motion and neck supple.  Lymphadenopathy:     Cervical: No cervical adenopathy.  Skin:    General: Skin is warm and dry.     Findings: No rash.  Neurological:     Mental Status: She is alert.     Cranial  Nerves: No cranial nerve deficit.     Sensory: No sensory deficit.  Psychiatric:        Mood and Affect: Mood is not anxious or depressed.        Speech: Speech normal.        Behavior: Behavior normal. Behavior is cooperative.        Judgment: Judgment normal.      Results for orders placed or performed in visit on 06/19/20  HM MAMMOGRAPHY  Result Value Ref Range   HM Mammogram 0-4 Bi-Rad 0-4 Bi-Rad, Self Reported Normal     COVID 19 screen:  No recent travel or known exposure to [place] The patient denies respiratory symptoms of COVID 19 at this time. The importance of social distancing was discussed today.   Assessment and Plan   The patient's preventative maintenance and recommended screening tests for an annual wellness exam were reviewed in full today. Brought up to date unless services declined.  Counselled on the importance of diet, exercise, and its role in overall health and mortality. The patient's FH and SH was reviewed, including their home life, tobacco status, and drug and alcohol status.   Vaccines: Td uptodate, due for flu...refused, COVID x2, encouraged booster Pap/DVE:  Per GYN, last seen in 03/2021 Dr. Melonie [place] Mammo:  Per GYN.. Mother with breast cancer died age 35. Plans BRCA1 and 2 testing  Done 03/2021 Colon:  2016 repeat in 10 years, Dr. Collene Mares Smoking Status:none ETOH/ drug use:  rare/none  Problem List Items Addressed This Visit     MDD (recurrent major depressive disorder) in remission ([place]) (Chronic)    Chronic, well controlled in remission on no medication.       Other Visit Diagnoses     Routine general medical examination at a health care facility    -  Primary   Screening cholesterol level       Relevant Orders   Comprehensive metabolic panel   Lipid panel        Eliezer Lofts, MD

## 2021-10-11 NOTE — Assessment & Plan Note (Signed)
Chronic, well controlled in remission on no medication.

## 2021-10-12 LAB — COMPREHENSIVE METABOLIC PANEL
ALT: 29 U/L (ref 0–35)
AST: 20 U/L (ref 0–37)
Albumin: 4.2 g/dL (ref 3.5–5.2)
Alkaline Phosphatase: 89 U/L (ref 39–117)
BUN: 13 mg/dL (ref 6–23)
CO2: 27 mEq/L (ref 19–32)
Calcium: 9.6 mg/dL (ref 8.4–10.5)
Chloride: 106 mEq/L (ref 96–112)
Creatinine, Ser: 0.88 mg/dL (ref 0.40–1.20)
GFR: 74.14 mL/min (ref >60.00)
Glucose, Bld: 99 mg/dL (ref 70–99)
Potassium: 4.3 mEq/L (ref 3.5–5.1)
Sodium: 143 mEq/L (ref 135–145)
Total Bilirubin: 0.4 mg/dL (ref 0.2–1.2)
Total Protein: 6.7 g/dL (ref 6.0–8.3)

## 2021-10-12 LAB — LIPID PANEL
Cholesterol: 217 mg/dL — ABNORMAL HIGH (ref 0–200)
HDL: 55.4 mg/dL (ref >39.00)
LDL Cholesterol: 134 mg/dL — ABNORMAL HIGH (ref 0–99)
NonHDL: 161.63
Total CHOL/HDL Ratio: 4
Triglycerides: 137 mg/dL (ref 0.0–149.0)
VLDL: 27.4 mg/dL (ref 0.0–40.0)

## 2021-11-07 ENCOUNTER — Ambulatory Visit: Payer: Managed Care, Other (non HMO) | Admitting: Family Medicine

## 2022-02-27 ENCOUNTER — Ambulatory Visit: Payer: Self-pay

## 2022-02-28 ENCOUNTER — Ambulatory Visit (INDEPENDENT_AMBULATORY_CARE_PROVIDER_SITE_OTHER): Payer: BC Managed Care – PPO | Admitting: Family Medicine

## 2022-02-28 ENCOUNTER — Encounter: Payer: Self-pay | Admitting: Family Medicine

## 2022-02-28 VITALS — BP 110/70 | HR 74 | Temp 98.1°F | Ht 64.0 in | Wt 188.0 lb

## 2022-02-28 DIAGNOSIS — R21 Rash and other nonspecific skin eruption: Secondary | ICD-10-CM | POA: Diagnosis not present

## 2022-02-28 MED ORDER — VALACYCLOVIR HCL 1 G PO TABS: 1000.0000 mg | ORAL_TABLET | Freq: Three times a day (TID) | ORAL | 0 refills | Status: DC

## 2022-02-28 NOTE — Progress Notes (Signed)
Patient ID: Kelsey Carlson, female    DOB: July 04, 1965, 56 y.o.   MRN: [id number]  This visit was conducted in person.  Ht '5\' 4"'$  (1.626 m)   BMI 31.84 kg/m    CC:  Chief Complaint  Patient presents with   Facial Swelling    Sore on right cheek bone    Subjective:   HPI: Kelsey Carlson is a 56 y.o. female presenting on 02/28/2022 for Facial Swelling (Sore on right cheek bone)   She noted sore on right cheek in last 4 days... She tried to apply pressure to pop it although there was no pustule.  Next day it looked like a blister.  Now with pressure in right upper cheek bone, slight pressure.   No redness, no warmth.  No flu like symptoms... she does feel tired.  No fever.  No family or personal histor yof boils, MRSA.  Has not treated with medicaiton.   No stinging, tingly pain.. pain is more of an ache or pressure.       Relevant past medical, surgical, family and social history reviewed and updated as indicated. Interim medical history since our last visit reviewed. Allergies and medications reviewed and updated. Outpatient Medications Prior to Visit  Medication Sig Dispense Refill   Cholecalciferol (VITAMIN D-3) 125 MCG (5000 UT) TABS Take 1 tablet by mouth daily.     diclofenac (VOLTAREN) 75 MG EC tablet Take 1 tablet (75 mg total) by mouth 2 (two) times daily. 30 tablet 0   omeprazole (PRILOSEC) 10 MG capsule Take 10 mg by mouth as needed.     No facility-administered medications prior to visit.     Per HPI unless specifically indicated in ROS section below Review of Systems  Constitutional:  Negative for fatigue and fever.  HENT:  Negative for ear pain.   Eyes:  Negative for pain.  Respiratory:  Negative for chest tightness and shortness of breath.   Cardiovascular:  Negative for chest pain, palpitations and leg swelling.  Gastrointestinal:  Negative for abdominal pain.  Genitourinary:  Negative for dysuria.   Objective:  Ht '5\' 4"'$  (1.626  m)   BMI 31.84 kg/m   Wt Readings from Last 3 Encounters:  10/11/21 185 lb 8 oz (84.1 kg)  10/05/21 187 lb 12.8 oz (85.2 kg)  06/09/20 204 lb 8 oz (92.8 kg)      Physical Exam Constitutional:      General: She is not in acute distress.    Appearance: Normal appearance. She is well-developed. She is not ill-appearing or toxic-appearing.  HENT:     Head: Normocephalic.     Right Ear: Hearing, tympanic membrane, ear canal and external ear normal. Tympanic membrane is not erythematous, retracted or bulging.     Left Ear: Hearing, tympanic membrane, ear canal and external ear normal. Tympanic membrane is not erythematous, retracted or bulging.     Nose: No mucosal edema or rhinorrhea.     Right Sinus: No maxillary sinus tenderness or frontal sinus tenderness.     Left Sinus: No maxillary sinus tenderness or frontal sinus tenderness.     Mouth/Throat:     Pharynx: Uvula midline.  Eyes:     General: Lids are normal. Lids are everted, no foreign bodies appreciated.     Conjunctiva/sclera: Conjunctivae normal.     Pupils: Pupils are equal, round, and reactive to light.  Neck:     Thyroid: No thyroid mass or thyromegaly.     Vascular: No  carotid bruit.     Trachea: Trachea normal.  Cardiovascular:     Rate and Rhythm: Normal rate and regular rhythm.     Pulses: Normal pulses.     Heart sounds: Normal heart sounds, S1 normal and S2 normal. No murmur heard.    No friction rub. No gallop.  Pulmonary:     Effort: Pulmonary effort is normal. No tachypnea or respiratory distress.     Breath sounds: Normal breath sounds. No decreased breath sounds, wheezing, rhonchi or rales.  Abdominal:     General: Bowel sounds are normal.     Palpations: Abdomen is soft.     Tenderness: There is no abdominal tenderness.  Musculoskeletal:     Cervical back: Normal range of motion and neck supple.  Skin:    General: Skin is warm and dry.     Findings: No rash.  Neurological:     Mental Status: She is  alert.  Psychiatric:        Mood and Affect: Mood is not anxious or depressed.        Speech: Speech normal.        Behavior: Behavior normal. Behavior is cooperative.        Thought Content: Thought content normal.        Judgment: Judgment normal.        Results for orders placed or performed in visit on 10/11/21  Lipid panel  Result Value Ref Range   Cholesterol 217 (H) 0 - 200 mg/dL   Triglycerides 137.0 0.0 - 149.0 mg/dL   HDL 55.40 >39.00 mg/dL   VLDL 27.4 0.0 - 40.0 mg/dL   LDL Cholesterol 134 (H) 0 - 99 mg/dL   Total CHOL/HDL Ratio 4    NonHDL 161.63   Comprehensive metabolic panel  Result Value Ref Range   Sodium 143 135 - 145 mEq/L   Potassium 4.3 3.5 - 5.1 mEq/L   Chloride 106 96 - 112 mEq/L   CO2 27 19 - 32 mEq/L   Glucose, Bld 99 70 - 99 mg/dL   BUN 13 6 - 23 mg/dL   Creatinine, Ser 0.88 0.40 - 1.20 mg/dL   Total Bilirubin 0.4 0.2 - 1.2 mg/dL   Alkaline Phosphatase 89 39 - 117 U/L   AST 20 0 - 37 U/L   ALT 29 0 - 35 U/L   Total Protein 6.7 6.0 - 8.3 g/dL   Albumin 4.2 3.5 - 5.2 g/dL   GFR 74.14 >60.00 mL/min   Calcium 9.6 8.4 - 10.5 mg/dL     COVID 19 screen:  No recent travel or known exposure to COVID19 The patient denies respiratory symptoms of COVID 19 at this time. The importance of social distancing was discussed today.   Assessment and Plan     Eliezer Lofts, MD

## 2022-02-28 NOTE — Assessment & Plan Note (Signed)
Acute, No sign of local cellulitis.  Pain may be from pressure she exerted on her cheek with injuring of the tissue underneath.  Next line given she has had shingles in the past and this is unilateral near her eye I will treat empirically with valacyclovir 3 times daily for 7 days in case this is early or atypical shingles. Return and ER precautions given.

## 2022-02-28 NOTE — Patient Instructions (Signed)
Complete course of antivirals for empiric treatment of shingles.  Call if redness starting or swelling increasing.  Go to emergency room if any difficulty swallowing or trouble breathing.

## 2022-11-22 ENCOUNTER — Ambulatory Visit (INDEPENDENT_AMBULATORY_CARE_PROVIDER_SITE_OTHER): Payer: Managed Care, Other (non HMO) | Admitting: Internal Medicine

## 2022-11-22 ENCOUNTER — Encounter: Payer: Self-pay | Admitting: Internal Medicine

## 2022-11-22 VITALS — BP 130/80 | HR 76 | Temp 97.6°F | Ht 64.0 in | Wt 190.0 lb

## 2022-11-22 DIAGNOSIS — M25552 Pain in left hip: Secondary | ICD-10-CM | POA: Diagnosis not present

## 2022-11-22 DIAGNOSIS — M25551 Pain in right hip: Secondary | ICD-10-CM

## 2022-11-22 DIAGNOSIS — G8929 Other chronic pain: Secondary | ICD-10-CM

## 2022-11-22 MED ORDER — TRAMADOL HCL 50 MG PO TABS
50.0000 mg | ORAL_TABLET | Freq: Three times a day (TID) | ORAL | 0 refills | Status: AC | PRN
Start: 2022-11-22 — End: 2022-11-27

## 2022-11-22 MED ORDER — GABAPENTIN 300 MG PO CAPS
300.0000 mg | ORAL_CAPSULE | Freq: Three times a day (TID) | ORAL | 0 refills | Status: DC
Start: 2022-11-22 — End: 2022-12-10

## 2022-11-22 MED ORDER — NAPROXEN 500 MG PO TABS
500.0000 mg | ORAL_TABLET | Freq: Two times a day (BID) | ORAL | 0 refills | Status: DC
Start: 2022-11-22 — End: 2022-12-24

## 2022-11-22 MED ORDER — PREDNISONE 20 MG PO TABS
ORAL_TABLET | ORAL | 0 refills | Status: DC
Start: 2022-11-22 — End: 2022-12-10

## 2022-11-22 NOTE — Assessment & Plan Note (Addendum)
Hip Pain: Chronic and worsening lateral hip pain, more severe on the left and radiating down the leg with occasional numbness, is unresponsive to over-the-counter NSAIDs. She has a history of back surgery and known pelvic alignment issues, suggesting possible nerve impingement from spinal issues or meralgia paresthetica. We will order bilateral hip and lumbar spine x-rays and prescribe a Prednisone burst for potential nerve inflammation. Gabapentin will be started for nerve pain management, along with prescription strength Aleve. Tramadol may be considered for breakthrough pain. Physical therapy is recommended for potential spinal issues, and she is advised to avoid activities that exacerbate pain, such as prolonged standing and walking. Further imaging or referral to a specialist will be considered if pain persists or worsens.  Sleep Disturbance: The sleep disturbance is secondary to chronic hip pain. Management will include the above measures with the addition of Gabapentin at bedtime for sleep and nerve pain management.  We both suspect(s) this is meralgia paresthetica due to weight / pelvic alignment/ unequal leg length Gave handout and treated for both that and sciatica.

## 2022-11-22 NOTE — Progress Notes (Signed)
[facility]: [phone]   Routine Medical Office Visit  Patient:  Kelsey Carlson      Age: 57 y.o.       Sex:  female  Date:   11/22/2022 Patient Care Team: Excell Seltzer, MD as PCP - General Today's Healthcare Provider: Lula Olszewski, MD   Assessment and Plan:   Ebonne was seen today for bilateral hip pain.  Chronic hip pain, bilateral Overview: History of steroids / NSAIDs help Aleve 400 mg maybe helps a little Cold packs don't helps Stretch seems to not affect Hot showers seem to worsen Prolonged standing worsens. No associated back pain Left>right numbing down anterior thigh Sometimes numbing down front of thigh on left. Doesn't feel like its coming down from back Propping leg in bed was helpful for a while  right shoulder lower than left Normally has back pain history of spijnal surgery, but not at present. Known pelvic alignment problem and known herniated disks  Assessment & Plan: Hip Pain: Chronic and worsening lateral hip pain, more severe on the left and radiating down the leg with occasional numbness, is unresponsive to over-the-counter NSAIDs. She has a history of back surgery and known pelvic alignment issues, suggesting possible nerve impingement from spinal issues or meralgia paresthetica. We will order bilateral hip and lumbar spine x-rays and prescribe a Prednisone burst for potential nerve inflammation. Gabapentin will be started for nerve pain management, along with prescription strength Aleve. Tramadol may be considered for breakthrough pain. Physical therapy is recommended for potential spinal issues, and she is advised to avoid activities that exacerbate pain, such as prolonged standing and walking. Further imaging or referral to a specialist will be considered if pain persists or worsens.  Sleep Disturbance: The sleep disturbance is secondary to chronic hip pain. Management will include the above measures with the addition  of Gabapentin at bedtime for sleep and nerve pain management.  We both suspect(s) this is meralgia paresthetica due to weight / pelvic alignment/ unequal leg length Gave handout and treated for both that and sciatica.  Orders: -     XR HIP UNILAT W OR W/O PELVIS 2-3 VIEWS LEFT -     XR HIP UNILAT W OR W/O PELVIS 2-3 VIEWS RIGHT -     DG Lumbar Spine Complete; Future -     predniSONE; Take 2 pills for 3 days, 1 pill for 4 days  Dispense: 10 tablet; Refill: 0 -     traMADol HCl; Take 1 tablet (50 mg total) by mouth every 8 (eight) hours as needed for up to 5 days.  Dispense: 15 tablet; Refill: 0 -     Naproxen; Take 1 tablet (500 mg total) by mouth 2 (two) times daily with a meal.  Dispense: 60 tablet; Refill: 0 -     Ambulatory referral to Physical Therapy -     Gabapentin; Take 1 capsule (300 mg total) by mouth 3 (three) times daily.  Dispense: 90 capsule; Refill: 0           Clinical Presentation:    57 y.o. female who has NECK PAIN, CHRONIC; SLEEP APNEA; TRANSAMINASES, SERUM, ELEVATED; NEPHROLITHIASIS, HX OF; Chest pain; MDD (recurrent major depressive disorder) in remission (HCC); Insomnia; Class 1 obesity due to excess calories with body mass index (BMI) of 34.0 to 34.9 in adult; Varicose veins of both legs with edema; Chronic hip pain, bilateral; Chronic back pain; Acute gastritis; Tinnitus of both ears; and Facial rash on their problem list.  Her reasons/main concerns/chief complaints for today's office visit are Bilateral hip pain (For a few months, has gotten worse.)   AI-Extracted: Discussed the use of AI scribe software for clinical note transcription with the patient, who gave verbal consent to proceed.  History of Present Illness   The patient, presented with a chief complaint of worsening bilateral hip pain over the past few months. The pain has been severe enough to disrupt sleep and affect mood. The patient reported that the pain is primarily located laterally in the hip region  and has started to radiate down the leg. The pain was previously managed a couple of years ago with a course of steroids and anti-inflammatory medication prescribed by another physician.  The patient has attempted self-management with over-the-counter Aleve (400mg ) and Ibuprofen (800mg ), but these provided only temporary and minimal relief. Cold packs did not significantly alleviate the pain, and hot showers seemed to exacerbate the discomfort. The patient also reported that prolonged standing worsens the pain, which is a frequent requirement at work.  The pain is described as being always present, with varying degrees of intensity. On particularly painful days, the patient reported a numbing sensation radiating down the front of the left thigh. The patient also noted that elevating the legs while lying down provided temporary relief.  The patient has a known history of pelvic alignment issues and has previously undergone back surgery. The patient also reported a history of herniated discs. The patient has not had any recent imaging studies, with the last x-ray of the hips conducted approximately five years ago.  The patient denied any other symptoms or concerns at the time of the consultation. The patient's primary goal is to manage the pain effectively to improve sleep and overall quality of life.      Reviewed chart data: Past Medical History:  Diagnosis Date   Arthritis    Gallstones    History of kidney stones    Lumbar back pain 06/28/2013   Nephrolithiasis    Urolithiasis    Wears glasses     Outpatient Medications Prior to Visit  Medication Sig   Cholecalciferol (VITAMIN D-3) 125 MCG (5000 UT) TABS Take 1 tablet by mouth daily.   Apoaequorin (PREVAGEN PO) Take 1 tablet by mouth daily. (Patient not taking: Reported on 11/22/2022)   MAGNESIUM PO Take 1 each by mouth daily. (Patient not taking: Reported on 11/22/2022)   valACYclovir (VALTREX) 1000 MG tablet Take 1 tablet (1,000 mg total) by  mouth 3 (three) times daily. (Patient not taking: Reported on 11/22/2022)   No facility-administered medications prior to visit.         Clinical Data Analysis:   Physical Exam  BP 130/80 (BP Location: Left Arm, Patient Position: Sitting)   Pulse 76   Temp 97.6 F (36.4 C) (Temporal)   Ht 5\' 4"  (1.626 m)   Wt 190 lb (86.2 kg)   LMP 03/03/2021   SpO2 98%   BMI 32.61 kg/m  Wt Readings from Last 10 Encounters:  11/22/22 190 lb (86.2 kg)  02/28/22 188 lb (85.3 kg)  10/11/21 185 lb 8 oz (84.1 kg)  10/05/21 187 lb 12.8 oz (85.2 kg)  06/09/20 204 lb 8 oz (92.8 kg)  03/07/20 203 lb (92.1 kg)  03/03/20 203 lb (92.1 kg)  11/24/19 202 lb 8 oz (91.9 kg)  08/03/19 201 lb 4 oz (91.3 kg)  04/29/19 199 lb 4 oz (90.4 kg)   Vital signs reviewed.  Nursing notes reviewed. Weight trend reviewed.  Abnormalities and Problem-Specific physical exam findings:  pain down both hips and anterior thighs. Able to walk, slightly slow sit to stand. Right shoulder lower than left but gait steady. Leg length discrepancy difficult to appreciate. Truncal adiposity   General Appearance:  No acute distress appreciable.   Well-groomed, healthy-appearing female.  Well proportioned with no abnormal fat distribution.  Good muscle tone. Skin: Clear and well-hydrated. Pulmonary:  Normal work of breathing at rest, no respiratory distress apparent. SpO2: 98 %  Musculoskeletal: All extremities are intact.  Neurological:  Awake, alert, oriented, and engaged.  No obvious focal neurological deficits or cognitive impairments.  Sensorium seems unclouded.   Speech is clear and coherent with logical content. Psychiatric:  Appropriate mood, pleasant and cooperative demeanor, thoughtful and engaged during the exam  Results Reviewed:    No results found for any visits on 11/22/22.  No visits with results within 1 Year(s) from this visit.  Latest known visit with results is:  Office Visit on 10/11/2021  Component Date Value    Cholesterol 10/11/2021 217 (H)    Triglycerides 10/11/2021 137.0    HDL 10/11/2021 55.40    VLDL 10/11/2021 27.4    LDL Cholesterol 10/11/2021 134 (H)    Total CHOL/HDL Ratio 10/11/2021 4    NonHDL 10/11/2021 161.63    Sodium 10/11/2021 143    Potassium 10/11/2021 4.3    Chloride 10/11/2021 106    CO2 10/11/2021 27    Glucose, Bld 10/11/2021 99    BUN 10/11/2021 13    Creatinine, Ser 10/11/2021 0.88    Total Bilirubin 10/11/2021 0.4    Alkaline Phosphatase 10/11/2021 89    AST 10/11/2021 20    ALT 10/11/2021 29    Total Protein 10/11/2021 6.7    Albumin 10/11/2021 4.2    GFR 10/11/2021 74.14    Calcium 10/11/2021 9.6    No image results found.   No results found.     This encounter employed real-time, collaborative documentation. The patient actively reviewed and updated their medical record on a shared screen, ensuring transparency and facilitating joint problem-solving for the problem list, overview, and plan. This approach promotes accurate, informed care. The treatment plan was discussed and reviewed in detail, including medication safety, potential side effects, and all patient questions. We confirmed understanding and comfort with the plan. Follow-up instructions were established, including contacting the office for any concerns, returning if symptoms worsen, persist, or new symptoms develop, and precautions for potential emergency department visits. ----------------------------------------------------- Lula Olszewski, MD  11/22/2022 10:18 PM  [facility]:  [phone]

## 2022-11-22 NOTE — Patient Instructions (Addendum)
Please go to our [facility] office to get your xrays done. You can walk in M-F between 8:30am- noon or 1pm - 5pm. Tell them you are there for xrays ordered by me. They will send me the results, then I will let you know the results with instructions.   Address: [address].  The Xray department is located in the basement. It was a pleasure seeing you today! Your health and satisfaction are our top priorities.   Glenetta Hew, MD  VISIT SUMMARY:  During your visit, we discussed your ongoing issue of bilateral hip pain that has been worsening over the past few months. This pain has been severe enough to disrupt your sleep and affect your mood. We also noted your history of back surgery and pelvic alignment issues. Our primary goal is to manage your pain effectively to improve your sleep and overall quality of life.  YOUR PLAN:  -HIP PAIN: Your hip pain is chronic and has been getting worse, especially on the left side. It's not responding to over-the-counter pain relievers. We suspect it might be due to nerve impingement from spinal issues or a condition called meralgia paresthetica, which is a disorder characterized by tingling, numbness, and burning pain in the outer part of your thigh. We will order x-rays of your hips and lower spine and prescribe Prednisone, a medication to reduce inflammation, and Gabapentin, a medication for nerve pain. We will also give you a stronger version of Aleve. If needed, we may consider Tramadol for severe pain. We recommend physical therapy and suggest you avoid activities that make your pain worse, like standing or walking for long periods.  -SLEEP DISTURBANCE: Your sleep issues are mainly due to your hip pain. We will manage this by treating your hip pain and prescribing Gabapentin at bedtime to help with sleep and nerve pain.  -GENERAL HEALTH MAINTENANCE: Given your history of melanoma, it's important for you to regularly check your skin for any  changes and report them to [place].  INSTRUCTIONS:  We will be ordering x-rays of your hips and lower spine. Please take the prescribed medications as directed. Start physical therapy as recommended and try to avoid activities that worsen your pain. Monitor your skin regularly for any changes and let us know if you notice anything unusual. If your pain persists or worsens, we may consider further imaging or referral to a specialist.    Next Steps:  [x]  Flexible Follow-Up: We recommend a follow up with your primary care, a specialist, or me within 1-2 weeks for ensuring your problem resolves. This allows for progress monitoring and treatment adjustments. [x]  Early Intervention: Schedule sooner appointment, call our on-call services, or go to emergency room if there is Increase in pain or discomfort New or worsening symptoms Sudden or severe changes in your health [x]  Lab & X-ray Appointments: complete or schedule to complete today, or call to schedule.  X-rays: [facility] (M-F, 8:30am-noon or 1pm-5pm).  Making the Most of Our Focused (20 minute) Follow Up Appointments:  [x]   Clearly state your top concerns at the beginning of the visit to focus our discussion [x]   If you anticipate you will need more time, please inform the front desk during scheduling - we can book multiple appointments in the same week. [x]   If you have transportation problems- use our convenient video appointments or ask about transportation support. [x]   We can get down to business faster if you use MyChart to  update information before the visit and submit non-urgent questions before your visit. Thank you for taking the time to provide details through MyChart.  Let our nurse know and she can import this information into your encounter documents.  Arrival and Wait Times: [x]   Arriving on time ensures that everyone receives prompt attention. [x]   Early morning (8a) and afternoon (1p) appointments tend to have  shortest wait times. [x]   Unfortunately, we cannot delay appointments for late arrivals or hold slots during phone calls.  Getting Answers and Following Up  [x]   Simple Questions & Concerns: For quick questions or basic follow-up after your visit, reach [place] at [phone] or MyChart messaging. [x]   Complex Concerns: If your concern is more complex, scheduling an appointment might be best. Discuss this with the staff to find the most suitable option. [x]   Lab & Imaging Results: We'll contact you directly if results are abnormal or you don't use MyChart. Most normal results will be on MyChart within 2-3 business days, with a review message from Dr. Jon Billings. Haven't heard back in 2 weeks? Need results sooner? Contact us at [phone]. [x]   Referrals: Our referral coordinator will manage specialist referrals. The specialist's office should contact you within 2 weeks to schedule an appointment. Call us if you haven't heard from them after 2 weeks.  Staying Connected  [x]   MyChart: Activate your MyChart for the fastest way to access results and message [place]. See the last page of this paperwork for instructions on how to activate.  Bring to Your Next Appointment  [x]   Medications: Please bring all your medication bottles to your next appointment to ensure we have an accurate record of your prescriptions. [x]   Health Diaries: If you're monitoring any health conditions at home, keeping a diary of your readings can be very helpful for discussions at your next appointment.  Billing  [x]   X-ray & Lab Orders: These are billed by separate companies. Contact the invoicing company directly for questions or concerns. [x]   Visit Charges: Discuss any billing inquiries with our administrative services team.  Your Satisfaction Matters  [x]   Share Your Experience: We strive for your satisfaction! If you have any complaints, or preferably compliments, please let Dr. Jon Billings know directly or contact our  Practice Administrators, Edwena Felty or [employer], by asking at the front desk.   Reviewing Your Records  [x]   Review this early draft of your clinical encounter notes below and the final encounter summary tomorrow on MyChart after its been completed.   Chronic hip pain, bilateral -     XR HIP UNILAT W OR W/O PELVIS 2-3 VIEWS LEFT -     XR HIP UNILAT W OR W/O PELVIS 2-3 VIEWS RIGHT -     DG Lumbar Spine Complete; Future -     predniSONE; Take 2 pills for 3 days, 1 pill for 4 days  Dispense: 10 tablet; Refill: 0 -     traMADol HCl; Take 1 tablet (50 mg total) by mouth every 8 (eight) hours as needed for up to 5 days.  Dispense: 15 tablet; Refill: 0 -     Naproxen; Take 1 tablet (500 mg total) by mouth 2 (two) times daily with a meal.  Dispense: 60 tablet; Refill: 0 -     Ambulatory referral to Physical Therapy -     Gabapentin; Take 1 capsule (300 mg total) by mouth 3 (three) times daily.  Dispense: 90 capsule; Refill: 0

## 2022-11-28 ENCOUNTER — Telehealth: Payer: Self-pay | Admitting: *Deleted

## 2022-11-28 DIAGNOSIS — Z13220 Encounter for screening for lipoid disorders: Secondary | ICD-10-CM

## 2022-11-28 NOTE — Telephone Encounter (Signed)
-----   Message from Lovena Neighbours sent at 11/28/2022  2:24 PM EDT ----- Regarding: Labs for 7.30.24 Please put physical lab orders in future. Thank you, Denny Peon

## 2022-12-02 ENCOUNTER — Ambulatory Visit
Admission: RE | Admit: 2022-12-02 | Discharge: 2022-12-02 | Disposition: A | Discharge status: Home / Self Care | Payer: Managed Care, Other (non HMO) | Source: Ambulatory Visit | Attending: Internal Medicine

## 2022-12-02 DIAGNOSIS — M25552 Pain in left hip: Secondary | ICD-10-CM

## 2022-12-02 DIAGNOSIS — M25551 Pain in right hip: Secondary | ICD-10-CM

## 2022-12-02 DIAGNOSIS — G8929 Other chronic pain: Secondary | ICD-10-CM | POA: Diagnosis not present

## 2022-12-03 NOTE — Therapy (Unsigned)
OUTPATIENT PHYSICAL THERAPY THORACOLUMBAR EVALUATION   Patient Name: Kelsey Carlson MRN: [id number] DOB:09-Nov-1965, 57 y.o., female Today's Date: 12/04/2022  END OF SESSION:  PT End of Session - 12/04/22 1515     Visit Number 1    Number of Visits 16    Date for PT Re-Evaluation 02/26/23    Authorization Type cigna VL 50 combine with OT/SP    Authorization - Visit Number 1    Authorization - Number of Visits 50    PT Start Time 1517    PT Stop Time 1602    PT Time Calculation (min) 45 min    Activity Tolerance Patient limited by pain    Behavior During Therapy [facility] for tasks assessed/performed             Past Medical History:  Diagnosis Date   Arthritis    Gallstones    History of kidney stones    Lumbar back pain 06/28/2013   Nephrolithiasis    Urolithiasis    Wears glasses    Past Surgical History:  Procedure Laterality Date   ANKLE ARTHRODESIS  1981   left ankle bone tumor-   BLADDER SUSPENSION  2008   sling-cysto   breast biopsy  2002   lt br bx   BREAST CYST EXCISION Left 02/19/2013   Procedure:  EXCISION BREAST MASS;  Surgeon: Maisie Fus A. Cornett, MD;  Location: [facility];  Service: General;  Laterality: Left;   BREAST EXCISIONAL BIOPSY Left 2014   benign   BREAST SURGERY     CHOLECYSTECTOMY N/A 08/27/2017   Procedure: LAPAROSCOPIC CHOLECYSTECTOMY WITH INTRAOPERATIVE CHOLANGIOGRAM;  Surgeon: Griselda Miner, MD;  Location: [facility] OR;  Service: General;  Laterality: N/A;   DILATION AND CURETTAGE OF UTERUS     LUMBAR DISC SURGERY  1997   lumb lam   TUBAL LIGATION     Essure   Patient Active Problem List   Diagnosis Date Noted   Facial rash 02/28/2022   Tinnitus of both ears 06/09/2020   Acute gastritis 08/03/2019   Chronic hip pain, bilateral 12/02/2017   Chronic back pain 12/02/2017   Class 1 obesity due to excess calories with body mass index (BMI) of 34.0 to 34.9 in adult 12/08/2014   Varicose veins of both legs with edema  12/08/2014   Insomnia 02/18/2012   MDD (recurrent major depressive disorder) in remission (HCC) 01/16/2012   TRANSAMINASES, SERUM, ELEVATED 05/27/2008   NEPHROLITHIASIS, HX OF 05/23/2008   SLEEP APNEA 05/30/2007   NECK PAIN, CHRONIC 02/25/2007   Chest pain 02/25/2007    PCP: Lula Olszewski, MD  REFERRING PROVIDER: Lula Olszewski, MD  REFERRING DIAG: M25.551,M25.552,G89.29 (ICD-10-CM) - Chronic hip pain, bilateral  Rationale for Evaluation and Treatment: Rehabilitation  THERAPY DIAG:  Bilateral hip pain  Muscle weakness (generalized)  Difficulty in walking, not elsewhere classified  ONSET DATE: months but has worsened over the last couple months  SUBJECTIVE:  SUBJECTIVE STATEMENT: Pain at night, can't sleep. Steps a wrong way and then has pain. Starts to feel better and then does too much and it comes back. States she sometimes has a pinch and it will stay there. Reprots it will go down the back of her thigh but doesn't really go to the calf. States her back gets tired and then her legs start to hurt L>R .  States laying down causes pain, slight elevation of her legs helps her a little bit.  States she would like to sleep on her side but then she ends up on her back. States that when she lays on her side it is always hurts on the left. States she hasn't tried adding an extra pillow between her legs. States she has tried her Scientist, clinical (histocompatibility and immunogenetics) and that helped.   States that she has tried her gabapentin and that helps a little bit. Has to lift her leg in and out of the car when her pain is really bad. PERTINENT HISTORY:  lumbar surgery 77' (clean out)  PAIN:  Are you having pain? Yes: NPRS scale: 3/10 Pain location: inside of left thigh Pain description: achy Aggravating factors: laying down, walking,  step differently Relieving factors: changing positions, pool, massager    PRECAUTIONS: None  RED FLAGS: None   WEIGHT BEARING RESTRICTIONS: No  FALLS:  Has patient fallen in last 6 months? 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  PLOF: Independent  PATIENT GOALS: to have less pain and be able to sleep   OBJECTIVE:   DIAGNOSTIC FINDINGS:  Xray 12/02/22 - not updated in chart yet  Right hip  Left hip Lumbar spine   PATIENT SURVEYS:  FOTO 7%     COGNITION: Overall cognitive status: Within functional limits for tasks assessed     SENSATION: Not tested Observation: Pitting edema 4+ bilaterally in lower extremities  POSTURE: rounded shoulders, forward head, flexed trunk , and limited hip extension  PALPATION: Tenderness to palpation along left piriformis and glutes increased resting tone throughout bilateral glutes  LUMBAR ROM:   AROM eval  Flexion 50% limited  Extension 75% limited pain on left low back  Right lateral flexion 75% limited felt stuck  Left lateral flexion 25% limited   Right rotation   Left rotation    (Blank rows = not tested)    LE Measurements Lower Extremity Right EVAL Left EVAL   A/PROM MMT A/PROM MMT  Hip Flexion 100* 3* 100* 3*  Hip Extension      Hip Abduction      Hip Adduction      Hip Internal rotation 30  15*   Hip External rotation 45  30*   Knee Flexion  3+  3  Knee Extension  4-  3+  Ankle Dorsiflexion  4-  4-  Ankle Plantarflexion      Ankle Inversion      Ankle Eversion       (Blank rows = not tested) * pain Muscle guarding throughout session difficult to determine true range of motion and strength  LUMBAR SPECIAL TESTS:  Slump neg B Traction - relief   Negative straight leg raise test bilaterally  TODAY'S TREATMENT:  DATE:    12/04/2022  Therapeutic Exercise:  Aerobic: Supine: long exhale  breathing - 5 minutes Prone:  Seated:  Standing: L stretch x5 20" holds Neuromuscular Re-education: Manual Therapy: Lumbar traction on ball 6 minutes tolerated well Therapeutic Activity: Self Care: Trigger Point Dry Needling:  Modalities:    PATIENT EDUCATION:  Education details: on current presentation, on HEP, on clinical outcomes score and POC, on benefits of compression garments in regards to bilateral pitting edema, on benefits of aquatic floating and exercise to reduce stress on lumbar spine Person educated: Patient Education method: Explanation, Demonstration, and Handouts Education comprehension: verbalized understanding   HOME EXERCISE PROGRAM: [phone]  ASSESSMENT:  CLINICAL IMPRESSION: Patient presents to physical therapy with complaints of chronic low back pain but recent increase in pain over the last few months.  Pain radiates down legs and is worse with all positions and movements but predominantly worse in supine position.  Patient heavily guarded throughout today's session making testing difficult.  Patient did tolerate lumbar traction well noting reduced pain afterwards.  Educated patient in ways to achieve lumbar decompression throughout the day and at the end of the day.  Patient would greatly benefit from skilled PT to improve physical impairments of strength, range of motion and postural limitations.  OBJECTIVE IMPAIRMENTS: decreased activity tolerance, decreased mobility, difficulty walking, decreased ROM, decreased strength, increased edema, improper body mechanics, postural dysfunction, and pain.   ACTIVITY LIMITATIONS: carrying, lifting, bending, sitting, standing, squatting, sleeping, stairs, transfers, bed mobility, dressing, reach over head, and locomotion level  PARTICIPATION LIMITATIONS: meal prep, cleaning, driving, community activity, and occupation  PERSONAL FACTORS: Fitness,  Profession, Time since onset of injury/illness/exacerbation, and 1 comorbidity: History of lumbar surgery  are also affecting patient's functional outcome.   REHAB POTENTIAL: Good  CLINICAL DECISION MAKING: Stable/uncomplicated  EVALUATION COMPLEXITY: Low   GOALS: Goals reviewed with patient? yes  SHORT TERM GOALS: Target date: 01/15/2023  Patient will be independent in self management strategies to improve quality of life and functional outcomes. Baseline: New Program Goal status: INITIAL  2.  Patient will report at least 50% improvement in overall symptoms and/or function to demonstrate improved functional mobility Baseline: 0% better Goal status: INITIAL  3.  Patient will report improved ergonomic set up at work to reduce stress on lumbar spine Baseline: Not currently Goal status: INITIAL  4.  Patient will demonstrate pain free lumbar range of motion to improve gross lumbar mobility Baseline: Painful Goal status: INITIAL    LONG TERM GOALS: Target date: 02/26/2023   Patient will report at least 75% improvement in overall symptoms and/or function to demonstrate improved functional mobility Baseline: 0% better Goal status: INITIAL  2.  Patient will improve score on FOTO outcomes measure to projected score to demonstrate overall improved function and QOL Baseline: see above Goal status: INITIAL  3.  Patient will demonstrate at least 4/5 MMT throughout lower extremities Baseline: See above Goal status: INITIAL  4.  Patient will be able to walk for 15 to 20 minutes at a time without severe pain to return to walking her dog if she chooses to Baseline: Unable Goal status: INITIAL   PLAN:  PT FREQUENCY: 1-2x/week for total of 16 visits over 12 weeks certification period  PT DURATION: 12 weeks  PLANNED INTERVENTIONS: Therapeutic exercises, Therapeutic activity, Neuromuscular re-education, Balance training, Gait training, Patient/Family education, Self Care, Joint  mobilization, Joint manipulation, Stair training, Vestibular training, Canalith repositioning, Orthotic/Fit training, Prosthetic training, DME instructions, Aquatic Therapy, Dry Needling, Electrical stimulation,  Spinal manipulation, Spinal mobilization, Cryotherapy, Moist heat, Taping, Traction, Ultrasound, Ionotophoresis 4mg /ml Dexamethasone, Manual therapy, and Re-evaluation.   PLAN FOR NEXT SESSION: Breathing exercises, down-regulation, ergonomic set up, range of motion and isometrics, follow-up with traction, vibration therapy   4:58 PM, 12/04/22 Tereasa Coop, DPT Physical Therapy with [facility]

## 2022-12-04 ENCOUNTER — Encounter: Payer: Self-pay | Admitting: Physical Therapy

## 2022-12-04 ENCOUNTER — Ambulatory Visit: Payer: Managed Care, Other (non HMO) | Admitting: Physical Therapy

## 2022-12-04 DIAGNOSIS — M25552 Pain in left hip: Secondary | ICD-10-CM

## 2022-12-04 DIAGNOSIS — M6281 Muscle weakness (generalized): Secondary | ICD-10-CM

## 2022-12-04 DIAGNOSIS — R262 Difficulty in walking, not elsewhere classified: Secondary | ICD-10-CM

## 2022-12-04 DIAGNOSIS — M25551 Pain in right hip: Secondary | ICD-10-CM

## 2022-12-08 NOTE — Progress Notes (Signed)
Subject: Update on your hip and lumbar spine X-ray results  I hope this message finds you managing well since our last appointment. I've reviewed the results of both your hip and lumbar spine X-rays and wanted to provide you with a comprehensive update:  - Hip X-ray findings:   - No evidence of acute hip fracture or dislocation   - Mild-to-moderate degenerative changes in both hips  - Lumbar spine X-ray findings:   - Mild degenerative disc disease at L4-L5 and L5-S1   - Slight curvature of the lumbar spine (trace levoconvex curvature)   - No fractures identified  - What this means:   - The X-rays confirm the presence of some wear and tear in your hip joints and lower spine   - These findings align with the symptoms you've been experiencing   - The changes in your lower back could be contributing to your hip pain and the numbness you feel down your leg  - How this relates to our plan:   1. Medications:      - Continue with the prescribed medications (Prednisone, Gabapentin, Naproxen, Tramadol)      - These should help manage pain and inflammation in both your hips and lower back    2. Physical Therapy:      - Even more important now that we've confirmed joint and spine changes      - Will help improve mobility, strengthen supporting muscles, and potentially alleviate some pressure on nerves    3. Lifestyle adjustments:      - Continue to avoid activities that worsen pain (e.g., prolonged standing)      - Use of cold packs may still be beneficial for your hips      - Consider gentle stretches for your lower back, as recommended by your physical therapist  - Next steps:   1. Attend your physical therapy sessions as scheduled   2. Keep track of your pain levels and any changes in symptoms, particularly noting any differences between hip pain and back pain   3. We may consider a referral to a spine specialist if your symptoms persist or worsen\   4.  I see that you have an upcoming  appointment with Dr. Ermalene Searing, I'm sharing this note and xray findings with her to coordinate care so that you can discuss there.  Remember, while the X-rays show some joint and disc changes, this doesn't mean you can't improve. Many people with similar findings achieve significant pain relief and improved function with the right treatment approach.  If you're experiencing any new symptoms or have concerns about your current treatment plan, please don't hesitate to reach out. We'll reassess your progress at your next appointment and adjust our approach as needed.    Glenetta Hew, MD/Ph.D [facility] [phone]

## 2022-12-09 ENCOUNTER — Ambulatory Visit: Payer: Managed Care, Other (non HMO) | Admitting: Physical Therapy

## 2022-12-09 ENCOUNTER — Encounter: Payer: Self-pay | Admitting: Physical Therapy

## 2022-12-09 DIAGNOSIS — M6281 Muscle weakness (generalized): Secondary | ICD-10-CM

## 2022-12-09 DIAGNOSIS — M25552 Pain in left hip: Secondary | ICD-10-CM

## 2022-12-09 DIAGNOSIS — R262 Difficulty in walking, not elsewhere classified: Secondary | ICD-10-CM | POA: Diagnosis not present

## 2022-12-09 DIAGNOSIS — M25551 Pain in right hip: Secondary | ICD-10-CM

## 2022-12-09 NOTE — Therapy (Signed)
OUTPATIENT PHYSICAL THERAPY THORACOLUMBAR TREATMENT   Patient Name: Kelsey Carlson MRN: [id number] DOB:1966/03/01, 57 y.o., female Today's Date: 12/09/2022  END OF SESSION:  PT End of Session - 12/09/22 1305     Visit Number 2    Number of Visits 16    Date for PT Re-Evaluation 02/26/23    Authorization Type cigna VL 50 combine with OT/SP    Authorization - Visit Number 2    Authorization - Number of Visits 50    PT Start Time 1305    PT Stop Time 1345    PT Time Calculation (min) 40 min    Activity Tolerance Patient limited by pain    Behavior During Therapy [facility] for tasks assessed/performed              Past Medical History:  Diagnosis Date   Arthritis    Gallstones    History of kidney stones    Lumbar back pain 06/28/2013   Nephrolithiasis    Urolithiasis    Wears glasses    Past Surgical History:  Procedure Laterality Date   ANKLE ARTHRODESIS  1981   left ankle bone tumor-   BLADDER SUSPENSION  2008   sling-cysto   breast biopsy  2002   lt br bx   BREAST CYST EXCISION Left 02/19/2013   Procedure:  EXCISION BREAST MASS;  Surgeon: Maisie Fus A. Cornett, MD;  Location: [facility];  Service: General;  Laterality: Left;   BREAST EXCISIONAL BIOPSY Left 2014   benign   BREAST SURGERY     CHOLECYSTECTOMY N/A 08/27/2017   Procedure: LAPAROSCOPIC CHOLECYSTECTOMY WITH INTRAOPERATIVE CHOLANGIOGRAM;  Surgeon: Griselda Miner, MD;  Location: [facility] OR;  Service: General;  Laterality: N/A;   DILATION AND CURETTAGE OF UTERUS     LUMBAR DISC SURGERY  1997   lumb lam   TUBAL LIGATION     Essure   Patient Active Problem List   Diagnosis Date Noted   Facial rash 02/28/2022   Tinnitus of both ears 06/09/2020   Acute gastritis 08/03/2019   Chronic hip pain, bilateral 12/02/2017   Chronic back pain 12/02/2017   Class 1 obesity due to excess calories with body mass index (BMI) of 34.0 to 34.9 in adult 12/08/2014   Varicose veins of both legs with edema  12/08/2014   Insomnia 02/18/2012   MDD (recurrent major depressive disorder) in remission (HCC) 01/16/2012   TRANSAMINASES, SERUM, ELEVATED 05/27/2008   NEPHROLITHIASIS, HX OF 05/23/2008   SLEEP APNEA 05/30/2007   NECK PAIN, CHRONIC 02/25/2007   Chest pain 02/25/2007    PCP: Lula Olszewski, MD  REFERRING PROVIDER: Lula Olszewski, MD  REFERRING DIAG: M25.551,M25.552,G89.29 (ICD-10-CM) - Chronic hip pain, bilateral  Rationale for Evaluation and Treatment: Rehabilitation  THERAPY DIAG:  Bilateral hip pain  Difficulty in walking, not elsewhere classified  Muscle weakness (generalized)  ONSET DATE: months but has worsened over the last couple months  SUBJECTIVE:  SUBJECTIVE STATEMENT: 12/09/2022 Hip is better but back is bothering her. Ordered ball but is going to take a whole another week to get here. Likes her breathing exercises.   Pain at night, can't sleep. Steps a wrong way and then has pain. Starts to feel better and then does too much and it comes back. States she sometimes has a pinch and it will stay there. Reprots it will go down the back of her thigh but doesn't really go to the calf. States her back gets tired and then her legs start to hurt L>R .  States laying down causes pain, slight elevation of her legs helps her a little bit.  States she would like to sleep on her side but then she ends up on her back. States that when she lays on her side it is always hurts on the left. States she hasn't tried adding an extra pillow between her legs. States she has tried her Scientist, clinical (histocompatibility and immunogenetics) and that helped.   States that she has tried her gabapentin and that helps a little bit. Has to lift her leg in and out of the car when her pain is really bad. PERTINENT HISTORY:  lumbar surgery 32' (clean  out)  PAIN:  Are you having pain? Yes: NPRS scale: 3/10 Pain location: inside of left thigh Pain description: achy Aggravating factors: laying down, walking, step differently Relieving factors: changing positions, pool, massager    PRECAUTIONS: None  RED FLAGS: None   WEIGHT BEARING RESTRICTIONS: No  FALLS:  Has patient fallen in last 6 months? 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 Lumbar spine  IMPRESSION: Mild degenerative disc disease at L4-L5 and L5-S1.  PATIENT SURVEYS:  FOTO 7%     COGNITION: Overall cognitive status: Within functional limits for tasks assessed     SENSATION: Not tested Observation: Pitting edema 4+ bilaterally in lower extremities  POSTURE: rounded shoulders, forward head, flexed trunk , and limited hip extension  PALPATION: Tenderness to palpation along left piriformis and glutes increased resting tone throughout bilateral glutes  LUMBAR ROM:   AROM eval  Flexion 50% limited  Extension 75% limited pain on left low back  Right lateral flexion 75% limited felt stuck  Left lateral flexion 25% limited   Right rotation   Left rotation    (Blank rows = not tested)    LE Measurements Lower Extremity Right EVAL Left EVAL   A/PROM MMT A/PROM MMT  Hip Flexion 100* 3* 100* 3*  Hip Extension      Hip Abduction      Hip Adduction      Hip Internal rotation 30  15*   Hip External rotation 45  30*   Knee Flexion  3+  3  Knee Extension  4-  3+  Ankle Dorsiflexion  4-  4-  Ankle Plantarflexion      Ankle Inversion      Ankle Eversion       (Blank rows = not tested) * pain Muscle guarding throughout session difficult to determine true  range of motion and strength  LUMBAR SPECIAL TESTS:  Slump neg B Traction - relief   Negative straight leg raise test bilaterally  TODAY'S TREATMENT:                                                                                                                              DATE:   12/09/2022  Therapeutic Exercise:  Aerobic: Supine: long exhale  breathing - 2 minutes, hamstring iso on ball 2 minutes, SAQs on ball x15 B, DKC on ball 2 minutes, LTR 2 minutes on ball, bent knee fall outs 2 minutes  Prone:  Seated:  Standing: L stretch x5 20" holds Neuromuscular Re-education: Manual Therapy: Lumbar traction on ball 8 minutes tolerated well Therapeutic Activity: Self Care: Trigger Point Dry Needling:  Modalities:    PATIENT EDUCATION:  Education details: on HEP, on hook lying position decreasing pain in the back' Person educated: Patient Education method: Explanation, Demonstration, and Handouts Education comprehension: verbalized understanding   HOME EXERCISE PROGRAM: [phone]  ASSESSMENT:  CLINICAL IMPRESSION: 12/09/2022 Session continued with lumbar traction which was tolerated well with reduced pain noted afterwards. Progressed ball exercises which were all tolerated well. Focused on purposeful breathing during session which was tolerated well. Added hip ROM exercises which were also tolerated well but hip IR most restricted. Will continue with current POC as tolerated.    Eval: Patient presents to physical therapy with complaints of chronic low back pain but recent increase in pain over the last few months.  Pain radiates down legs and is worse with all positions and movements but predominantly worse in supine position.  Patient heavily guarded throughout today's session making testing difficult.  Patient did tolerate lumbar traction well noting reduced pain afterwards.  Educated patient in ways to achieve lumbar decompression throughout the day and at the end of the day.   Patient would greatly benefit from skilled PT to improve physical impairments of strength, range of motion and postural limitations.  OBJECTIVE IMPAIRMENTS: decreased activity tolerance, decreased mobility, difficulty walking, decreased ROM, decreased strength, increased edema, improper body mechanics, postural dysfunction, and pain.   ACTIVITY LIMITATIONS: carrying, lifting, bending, sitting, standing, squatting, sleeping, stairs, transfers, bed mobility, dressing, reach over head, and locomotion level  PARTICIPATION LIMITATIONS: meal prep, cleaning, driving, community activity, and occupation  PERSONAL FACTORS: Fitness, Profession, Time since onset of injury/illness/exacerbation, and 1 comorbidity: History of lumbar surgery  are also affecting patient's functional outcome.   REHAB POTENTIAL: Good  CLINICAL DECISION MAKING: Stable/uncomplicated  EVALUATION COMPLEXITY: Low   GOALS: Goals reviewed with patient? yes  SHORT TERM GOALS: Target date: 01/15/2023  Patient will be independent in self management strategies to improve quality of life and functional outcomes. Baseline: New Program Goal status: INITIAL  2.  Patient will report at least 50% improvement in overall symptoms and/or function to demonstrate improved functional mobility Baseline: 0% better Goal status: INITIAL  3.  Patient will report improved ergonomic set up at work to reduce stress on lumbar spine Baseline: Not currently Goal status: INITIAL  4.  Patient will demonstrate pain free lumbar range of  motion to improve gross lumbar mobility Baseline: Painful Goal status: INITIAL    LONG TERM GOALS: Target date: 02/26/2023   Patient will report at least 75% improvement in overall symptoms and/or function to demonstrate improved functional mobility Baseline: 0% better Goal status: INITIAL  2.  Patient will improve score on FOTO outcomes measure to projected score to demonstrate overall improved function and  QOL Baseline: see above Goal status: INITIAL  3.  Patient will demonstrate at least 4/5 MMT throughout lower extremities Baseline: See above Goal status: INITIAL  4.  Patient will be able to walk for 15 to 20 minutes at a time without severe pain to return to walking her dog if she chooses to Baseline: Unable Goal status: INITIAL   PLAN:  PT FREQUENCY: 1-2x/week for total of 16 visits over 12 weeks certification period  PT DURATION: 12 weeks  PLANNED INTERVENTIONS: Therapeutic exercises, Therapeutic activity, Neuromuscular re-education, Balance training, Gait training, Patient/Family education, Self Care, Joint mobilization, Joint manipulation, Stair training, Vestibular training, Canalith repositioning, Orthotic/Fit training, Prosthetic training, DME instructions, Aquatic Therapy, Dry Needling, Electrical stimulation, Spinal manipulation, Spinal mobilization, Cryotherapy, Moist heat, Taping, Traction, Ultrasound, Ionotophoresis 4mg /ml Dexamethasone, Manual therapy, and Re-evaluation.   PLAN FOR NEXT SESSION: Breathing exercises, down-regulation, ergonomic set up, range of motion and isometrics, follow-up with traction, vibration therapy   1:53 PM, 12/09/22 Tereasa Coop, DPT Physical Therapy with [facility]

## 2022-12-10 ENCOUNTER — Ambulatory Visit (INDEPENDENT_AMBULATORY_CARE_PROVIDER_SITE_OTHER): Payer: Managed Care, Other (non HMO) | Admitting: Family Medicine

## 2022-12-10 ENCOUNTER — Encounter: Payer: Self-pay | Admitting: Family Medicine

## 2022-12-10 VITALS — BP 120/80 | HR 83 | Temp 98.2°F | Ht 64.0 in | Wt 189.4 lb

## 2022-12-10 DIAGNOSIS — M25552 Pain in left hip: Secondary | ICD-10-CM | POA: Diagnosis not present

## 2022-12-10 DIAGNOSIS — G8929 Other chronic pain: Secondary | ICD-10-CM | POA: Diagnosis not present

## 2022-12-10 NOTE — Assessment & Plan Note (Signed)
Chronic, likely multifactorial with degenerative changes in lumbar spine as well as hip. She had significant improvement following prednisone taper.  I encouraged her to use Naprosyn 500 mg twice daily regularly for the next week or 2 while doing physical therapy.  She can use gabapentin as needed especially at bedtime to help her get comfortable.  She will limit tramadol use. If her symptoms or not continuing to improve we will move forward with referral to orthopedics for further evaluation and treatment.

## 2022-12-10 NOTE — Progress Notes (Signed)
Patient ID: Kelsey Carlson, female    DOB: [birth year], 57 y.o.   MRN: [id number]  This visit was conducted in person.  BP 120/80 (BP Location: Left Arm, Patient Position: Sitting, Cuff Size: Large)   Pulse 83   Temp 98.2 F (36.8 C) (Temporal)   Ht 5\' 4"  (1.626 m)   Wt 189 lb 6 oz (85.9 kg)   LMP 03/03/2021   SpO2 98%   BMI 32.51 kg/m    CC:  Chief Complaint  Patient presents with   Hip Pain    Left    Subjective:   HPI: Kelsey Carlson is a 57 y.o. female presenting on 12/10/2022 for Hip Pain (Left)  Reviewed office visit note from Dr. Jon Billings for chronic bilateral hip pain November 22, 2022.. she reports mainly left hip,  mainly lateral with some radiation She reported minimal improvement with ice, Aleve Bilateral hip and pelvis films along with lumbar spine reviewed: Showing mild to moderate degenerative changes of the hips bilaterally, mild degenerative disc disease L4-L5 and L5-S1 At that time she was treated with prednisone burst referred to physical therapy and started on gabapentin 300 mg 3 times daily and Naproxen 500 mg twice daily  Today she reports she has had  significant improvement  with prednisone. She has 80 % improvement.  She limiting activity. Has had 2 sessions of PT.  Initially use tramadol but not requiring in last 4 days.  Using gabapentin and naproxen prn.  She has history of back surgery      Relevant past medical, surgical, family and social history reviewed and updated as indicated. Interim medical history since our last visit reviewed. Allergies and medications reviewed and updated. Outpatient Medications Prior to Visit  Medication Sig Dispense Refill   Cholecalciferol (VITAMIN D-3) 125 MCG (5000 UT) TABS Take 1 tablet by mouth daily.     gabapentin (NEURONTIN) 300 MG capsule Take 300 mg by mouth daily as needed.     naproxen (NAPROSYN) 500 MG tablet Take 1 tablet (500 mg total) by mouth 2 (two) times daily with a meal. 60  tablet 0   predniSONE (DELTASONE) 20 MG tablet Take 2 pills for 3 days, 1 pill for 4 days 10 tablet 0   Apoaequorin (PREVAGEN PO) Take 1 tablet by mouth daily. (Patient not taking: Reported on 11/22/2022)     gabapentin (NEURONTIN) 300 MG capsule Take 1 capsule (300 mg total) by mouth 3 (three) times daily. (Patient not taking: Reported on 12/10/2022) 90 capsule 0   MAGNESIUM PO Take 1 each by mouth daily. (Patient not taking: Reported on 11/22/2022)     valACYclovir (VALTREX) 1000 MG tablet Take 1 tablet (1,000 mg total) by mouth 3 (three) times daily. (Patient not taking: Reported on 11/22/2022) 21 tablet 0   No facility-administered medications prior to visit.     Per HPI unless specifically indicated in ROS section below Review of Systems  Constitutional:  Negative for fatigue and fever.  HENT:  Negative for congestion.   Eyes:  Negative for pain.  Respiratory:  Negative for cough and shortness of breath.   Cardiovascular:  Negative for chest pain, palpitations and leg swelling.  Gastrointestinal:  Negative for abdominal pain.  Genitourinary:  Negative for dysuria and vaginal bleeding.  Musculoskeletal:  Negative for back pain.  Neurological:  Negative for syncope, light-headedness and headaches.  Psychiatric/Behavioral:  Negative for dysphoric mood.    Objective:  BP 120/80 (BP Location: Left Arm, Patient Position: Sitting, Cuff  Size: Large)   Pulse 83   Temp 98.2 F (36.8 C) (Temporal)   Ht 5\' 4"  (1.626 m)   Wt 189 lb 6 oz (85.9 kg)   LMP 03/03/2021   SpO2 98%   BMI 32.51 kg/m   Wt Readings from Last 3 Encounters:  12/10/22 189 lb 6 oz (85.9 kg)  11/22/22 190 lb (86.2 kg)  02/28/22 188 lb (85.3 kg)      Physical Exam Constitutional:      General: She is not in acute distress.    Appearance: Normal appearance. She is well-developed. She is not ill-appearing or toxic-appearing.  HENT:     Head: Normocephalic.     Right Ear: Hearing, tympanic membrane, ear canal and external  ear normal. Tympanic membrane is not erythematous, retracted or bulging.     Left Ear: Hearing, tympanic membrane, ear canal and external ear normal. Tympanic membrane is not erythematous, retracted or bulging.     Nose: No mucosal edema or rhinorrhea.     Right Sinus: No maxillary sinus tenderness or frontal sinus tenderness.     Left Sinus: No maxillary sinus tenderness or frontal sinus tenderness.     Mouth/Throat:     Mouth: Oropharynx is clear and moist and mucous membranes are normal.     Pharynx: Uvula midline.  Eyes:     General: Lids are normal. Lids are everted, no foreign bodies appreciated.     Extraocular Movements: EOM normal.     Conjunctiva/sclera: Conjunctivae normal.     Pupils: Pupils are equal, round, and reactive to light.  Neck:     Thyroid: No thyroid mass or thyromegaly.     Vascular: No carotid bruit.     Trachea: Trachea normal.  Cardiovascular:     Rate and Rhythm: Normal rate and regular rhythm.     Pulses: Normal pulses.     Heart sounds: Normal heart sounds, S1 normal and S2 normal. No murmur heard.    No friction rub. No gallop.  Pulmonary:     Effort: Pulmonary effort is normal. No tachypnea or respiratory distress.     Breath sounds: Normal breath sounds. No decreased breath sounds, wheezing, rhonchi or rales.  Abdominal:     General: Bowel sounds are normal.     Palpations: Abdomen is soft.     Tenderness: There is no abdominal tenderness.  Musculoskeletal:     Cervical back: Normal, normal range of motion and neck supple.     Thoracic back: Normal.     Lumbar back: Tenderness and bony tenderness present. Decreased range of motion. Negative right straight leg raise test and negative left straight leg raise test.     Right hip: Tenderness and bony tenderness present. Decreased range of motion.     Left hip: Bony tenderness present. Decreased range of motion.  Skin:    General: Skin is warm, dry and intact.     Findings: No rash.  Neurological:      Mental Status: She is alert.  Psychiatric:        Mood and Affect: Mood is not anxious or depressed.        Speech: Speech normal.        Behavior: Behavior normal. Behavior is cooperative.        Thought Content: Thought content normal.        Cognition and Memory: Cognition and memory normal.        Judgment: Judgment normal.       Results  for orders placed or performed in visit on 10/11/21  Lipid panel  Result Value Ref Range   Cholesterol 217 (H) 0 - 200 mg/dL   Triglycerides 841.3 0.0 - 149.0 mg/dL   HDL 24.40 >10.27 mg/dL   VLDL 25.3 0.0 - 66.4 mg/dL   LDL Cholesterol 403 (H) 0 - 99 mg/dL   Total CHOL/HDL Ratio 4    NonHDL 161.63   Comprehensive metabolic panel  Result Value Ref Range   Sodium 143 135 - 145 mEq/L   Potassium 4.3 3.5 - 5.1 mEq/L   Chloride 106 96 - 112 mEq/L   CO2 27 19 - 32 mEq/L   Glucose, Bld 99 70 - 99 mg/dL   BUN 13 6 - 23 mg/dL   Creatinine, Ser 4.74 0.40 - 1.20 mg/dL   Total Bilirubin 0.4 0.2 - 1.2 mg/dL   Alkaline Phosphatase 89 39 - 117 U/L   AST 20 0 - 37 U/L   ALT 29 0 - 35 U/L   Total Protein 6.7 6.0 - 8.3 g/dL   Albumin 4.2 3.5 - 5.2 g/dL   GFR 25.95 >63.87 mL/min   Calcium 9.6 8.4 - 10.5 mg/dL    Assessment and Plan  Chronic left hip pain Assessment & Plan: Chronic, likely multifactorial with degenerative changes in lumbar spine as well as hip. She had significant improvement following prednisone taper.  I encouraged her to use Naprosyn 500 mg twice daily regularly for the next week or 2 while doing physical therapy.  She can use gabapentin as needed especially at bedtime to help her get comfortable.  She will limit tramadol use. If her symptoms or not continuing to improve we will move forward with referral to orthopedics for further evaluation and treatment.     No follow-ups on file.   Kerby Nora, MD

## 2022-12-12 ENCOUNTER — Ambulatory Visit: Payer: Managed Care, Other (non HMO) | Admitting: Physical Therapy

## 2022-12-12 ENCOUNTER — Encounter: Payer: Self-pay | Admitting: Physical Therapy

## 2022-12-12 DIAGNOSIS — M25551 Pain in right hip: Secondary | ICD-10-CM | POA: Diagnosis not present

## 2022-12-12 DIAGNOSIS — M6281 Muscle weakness (generalized): Secondary | ICD-10-CM | POA: Diagnosis not present

## 2022-12-12 DIAGNOSIS — R262 Difficulty in walking, not elsewhere classified: Secondary | ICD-10-CM | POA: Diagnosis not present

## 2022-12-12 DIAGNOSIS — M25552 Pain in left hip: Secondary | ICD-10-CM

## 2022-12-12 NOTE — Therapy (Signed)
OUTPATIENT PHYSICAL THERAPY THORACOLUMBAR TREATMENT   Patient Name: Kelsey Carlson MRN: [id number] DOB:1965/07/20, 57 y.o., female Today's Date: 12/12/2022  END OF SESSION:  PT End of Session - 12/12/22 1344     Visit Number 3    Number of Visits 16    Date for PT Re-Evaluation 02/26/23    Authorization Type cign[employer] VL 50 combine with OT/SP    Authorization - Visit Number 3    Authorization - Number of Visits 50    PT Start Time 1345    PT Stop Time 1425    PT Time Calculation (min) 40 min    Activity Tolerance Patient limited by pain    Behavior During Therapy [facility] for tasks assessed/performed              Past Medical History:  Diagnosis Date   Arthritis    Gallstones    History of kidney stones    Lumbar back pain 06/28/2013   Nephrolithiasis    Urolithiasis    Wears glasses    Past Surgical History:  Procedure Laterality Date   ANKLE ARTHRODESIS  1981   left ankle bone tumor-   BLADDER SUSPENSION  2008   sling-cysto   breast biopsy  2002   lt br bx   BREAST CYST EXCISION Left 02/19/2013   Procedure:  EXCISION BREAST MASS;  Surgeon: Maisie Fus A. Cornett, MD;  Location: [facility];  Service: General;  Laterality: Left;   BREAST EXCISIONAL BIOPSY Left 2014   benign   BREAST SURGERY     CHOLECYSTECTOMY N/A 08/27/2017   Procedure: LAPAROSCOPIC CHOLECYSTECTOMY WITH INTRAOPERATIVE CHOLANGIOGRAM;  Surgeon: Griselda Miner, MD;  Location: [facility] OR;  Service: General;  Laterality: N/A;   DILATION AND CURETTAGE OF UTERUS     LUMBAR DISC SURGERY  1997   lumb lam   TUBAL LIGATION     [place]ure   Patient Active Problem List   Diagnosis Date Noted   Facial rash 02/28/2022   Tinnitus of both ears 06/09/2020   Acute gastritis 08/03/2019   Chronic left hip pain 12/02/2017   Chronic back pain 12/02/2017   Class 1 obesity due to excess calories with body mass index (BMI) of 34.0 to 34.9 in adult 12/08/2014   Varicose veins of both legs with edema 12/08/2014    Insomnia 02/18/2012   MDD (recurrent major depressive disorder) in remission (HCC) 01/16/2012   TRANSAMINASES, SERUM, ELEVATED 05/27/2008   NEPHROLITHIASIS, HX OF 05/23/2008   SLEEP APNEA 05/30/2007   NECK PAIN, CHRONIC 02/25/2007   Chest pain 02/25/2007    PCP: Lula Olszewski, MD  REFERRING PROVIDER: Lula Olszewski, MD  REFERRING DIAG: M25.551,M25.552,G89.29 (ICD-10-CM) - Chronic hip pain, bilateral  Rationale for Evaluation and Treatment: Rehabilitation  THERAPY DIAG:  Bilateral hip pain  Difficulty in walking, not elsewhere classified  Muscle weakness (generalized)  ONSET DATE: months but has worsened over the last couple months  SUBJECTIVE:  SUBJECTIVE STATEMENT: 12/12/2022 Still having difficulties sleeping because she is having pain when she sleeps on her left side. States she still hasn't gotten her ball.  Pain at night, can't sleep. Steps a wrong way and then has pain. Starts to feel better and then does too much and it comes back. States she sometimes has a pinch and it will stay there. Reprots it will go down the back of her thigh but doesn't really go to the calf. States her back gets tired and then her legs start to hurt L>R .  States laying down causes pain, slight elevation of her legs helps her a little bit.  States she would like to sleep on her side but then she ends up on her back. States that when she lays on her side it is always hurts on the left. States she hasn't tried adding an extra pillow between her legs. States she has tried her Scientist, clinical (histocompatibility and immunogenetics) and that helped.   States that she has tried her gabapentin and that helps a little bit. Has to lift her leg in and out of the car when her pain is really bad. PERTINENT HISTORY:  lumbar surgery 65' (clean out)  PAIN:  Are you  having pain? Yes: NPRS scale: 0/10 Pain location: inside of left thigh Pain description: achy Aggravating factors: laying down, walking, step differently Relieving factors: changing positions, pool, massager    PRECAUTIONS: None  RED FLAGS: None   WEIGHT BEARING RESTRICTIONS: No  FALLS:  Has patient fallen in last 6 months? 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 Lumbar spine  IMPRESSION: Mild degenerative disc disease at L4-L5 and L5-S1.  PATIENT SURVEYS:  FOTO 7%     COGNITION: Overall cognitive status: Within functional limits for tasks assessed     SENSATION: Not tested Observation: Pitting edema 4+ bilaterally in lower extremities  POSTURE: rounded shoulders, forward head, flexed trunk , and limited hip extension  PALPATION: Tenderness to palpation along left piriformis and glutes increased resting tone throughout bilateral glutes  LUMBAR ROM:   AROM eval  Flexion 50% limited  Extension 75% limited pain on left low back  Right lateral flexion 75% limited felt stuck  Left lateral flexion 25% limited   Right rotation   Left rotation    (Blank rows = not tested)    LE Measurements Lower Extremity Right EVAL Left EVAL   A/PROM MMT A/PROM MMT  Hip Flexion 100* 3* 100* 3*  Hip Extension      Hip Abduction      Hip Adduction      Hip Internal rotation 30  15*   Hip External rotation 45  30*   Knee Flexion  3+  3  Knee Extension  4-  3+  Ankle Dorsiflexion  4-  4-  Ankle Plantarflexion      Ankle Inversion      Ankle Eversion       (Blank rows = not tested) * pain Muscle guarding throughout session difficult to determine true range of motion and  strength  LUMBAR SPECIAL TESTS:  Slump neg B Traction - relief   Negative straight leg raise test bilaterally  TODAY'S TREATMENT:  DATE:   12/12/2022  Therapeutic Exercise:  Aerobic: Supine:   LTR 2 minutes on ball, PROM left hip IR/ER - PT performed 10 minutes, piriformis stretch - not tolerated on left x1 30" holds R,  Prone:  Seated:  Standing:  Neuromuscular Re-education: Manual Therapy: Lumbar traction on ball 10 minutes tolerated well, lateral and inferior mobilization left hip grade II tolerated well - reduced stiffness afterwards  Therapeutic Activity: Self Care: Trigger Point Dry Needling:  Modalities:    PATIENT EDUCATION:  Education details: on HEP, on use of foam for side sleeping to reduce pressure on hip Person educated: Patient Education method: Explanation, Demonstration, and Handouts Education comprehension: verbalized understanding   HOME EXERCISE PROGRAM: [phone]  ASSESSMENT:  CLINICAL IMPRESSION: 12/12/2022 Focused on manual interventions which were tolerated well. Improved hip mobility noted after mobilizations of left hip. Discussed anticipated soreness and use of ROM exercises and heat as needed. Will continue with progression of exercises as tolerated to continue to improve hip mobility and strength as tolerated. Patient reported comfort in hips end of session.   Eval: Patient presents to physical therapy with complaints of chronic low back pain but recent increase in pain over the last few months.  Pain radiates down legs and is worse with all positions and movements but predominantly worse in supine position.  Patient heavily guarded throughout today's session making testing difficult.  Patient did tolerate lumbar traction well noting reduced pain afterwards.  Educated patient in ways to achieve lumbar decompression throughout  the day and at the end of the day.  Patient would greatly benefit from skilled PT to improve physical impairments of strength, range of motion and postural limitations.  OBJECTIVE IMPAIRMENTS: decreased activity tolerance, decreased mobility, difficulty walking, decreased ROM, decreased strength, increased edema, improper body mechanics, postural dysfunction, and pain.   ACTIVITY LIMITATIONS: carrying, lifting, bending, sitting, standing, squatting, sleeping, stairs, transfers, bed mobility, dressing, reach over head, and locomotion level  PARTICIPATION LIMITATIONS: meal prep, cleaning, driving, community activity, and occupation  PERSONAL FACTORS: Fitness, Profession, Time since onset of injury/illness/exacerbation, and 1 comorbidity: History of lumbar surgery  are also affecting patient's functional outcome.   REHAB POTENTIAL: Good  CLINICAL DECISION MAKING: Stable/uncomplicated  EVALUATION COMPLEXITY: Low   GOALS: Goals reviewed with patient? yes  SHORT TERM GOALS: Target date: 01/15/2023  Patient will be independent in self management strategies to improve quality of life and functional outcomes. Baseline: New Program Goal status: INITIAL  2.  Patient will report at least 50% improvement in overall symptoms and/or function to demonstrate improved functional mobility Baseline: 0% better Goal status: INITIAL  3.  Patient will report improved ergonomic set up at work to reduce stress on lumbar spine Baseline: Not currently Goal status: INITIAL  4.  Patient will demonstrate pain free lumbar range of motion to improve gross lumbar mobility Baseline: Painful Goal status: INITIAL    LONG TERM GOALS: Target date: 02/26/2023   Patient will report at least 75% improvement in overall symptoms and/or function to demonstrate improved functional mobility Baseline: 0% better Goal status: INITIAL  2.  Patient will improve score on FOTO outcomes measure to projected score to demonstrate  overall improved function and QOL Baseline: see above Goal status: INITIAL  3.  Patient will demonstrate at least 4/5 MMT throughout lower extremities Baseline: See above Goal status: INITIAL  4.  Patient will be able to walk for 15 to 20 minutes at a time without severe pain to return to walking her dog if  she chooses to Baseline: Unable Goal status: INITIAL   PLAN:  PT FREQUENCY: 1-2x/week for total of 16 visits over 12 weeks certification period  PT DURATION: 12 weeks  PLANNED INTERVENTIONS: Therapeutic exercises, Therapeutic activity, Neuromuscular re-education, Balance training, Gait training, Patient/Family education, Self Care, Joint mobilization, Joint manipulation, Stair training, Vestibular training, Canalith repositioning, Orthotic/Fit training, Prosthetic training, DME instructions, Aquatic Therapy, Dry Needling, Electrical stimulation, Spinal manipulation, Spinal mobilization, Cryotherapy, Moist heat, Taping, Traction, Ultrasound, Ionotophoresis 4mg /ml Dexamethasone, Manual therapy, and Re-evaluation.   PLAN FOR NEXT SESSION: Breathing exercises, down-regulation, ergonomic set up, range of motion and isometrics, follow-up with traction, vibration therapy   4:08 PM, 12/12/22 Tereasa Coop, DPT Physical Therapy with [facility]

## 2022-12-16 ENCOUNTER — Ambulatory Visit: Payer: Managed Care, Other (non HMO) | Admitting: Physical Therapy

## 2022-12-16 ENCOUNTER — Encounter: Payer: Self-pay | Admitting: Physical Therapy

## 2022-12-16 DIAGNOSIS — R262 Difficulty in walking, not elsewhere classified: Secondary | ICD-10-CM

## 2022-12-16 DIAGNOSIS — M25552 Pain in left hip: Secondary | ICD-10-CM | POA: Diagnosis not present

## 2022-12-16 DIAGNOSIS — M25551 Pain in right hip: Secondary | ICD-10-CM | POA: Diagnosis not present

## 2022-12-16 DIAGNOSIS — M6281 Muscle weakness (generalized): Secondary | ICD-10-CM

## 2022-12-16 NOTE — Therapy (Signed)
OUTPATIENT PHYSICAL THERAPY THORACOLUMBAR TREATMENT   Patient Name: Kelsey Carlson MRN: [id number] DOB:25-May-1965, 57 y.o., female Today's Date: 12/16/2022  END OF SESSION:  PT End of Session - 12/16/22 1305     Visit Number 4    Number of Visits 16    Date for PT Re-Evaluation 02/26/23    Authorization Type c[employer] VL 50 combine with OT/SP    Authorization - Visit Number 4    Authorization - Number of Visits 50    PT Start Time 1305   late to check in   PT Stop Time 1343    PT Time Calculation (min) 38 min    Activity Tolerance Patient limited by pain    Behavior During Therapy [facility] for tasks assessed/performed              Past Medical History:  Diagnosis Date   Arthritis    Gallstones    History of kidney stones    Lumbar back pain 06/28/2013   Nephrolithiasis    Urolithiasis    Wears glasses    Past Surgical History:  Procedure Laterality Date   ANKLE ARTHRODESIS  1981   left ankle bone tumor-   BLADDER SUSPENSION  2008   sling-cysto   breast biopsy  2002   lt br bx   BREAST CYST EXCISION Left 02/19/2013   Procedure:  EXCISION BREAST MASS;  Surgeon: Maisie Fus A. Cornett, MD;  Location: [facility];  Service: General;  Laterality: Left;   BREAST EXCISIONAL BIOPSY Left 2014   benign   BREAST SURGERY     CHOLECYSTECTOMY N/A 08/27/2017   Procedure: LAPAROSCOPIC CHOLECYSTECTOMY WITH INTRAOPERATIVE CHOLANGIOGRAM;  Surgeon: Griselda Miner, MD;  Location: [facility] OR;  Service: General;  Laterality: N/A;   DILATION AND CURETTAGE OF UTERUS     LUMBAR DISC SURGERY  1997   lumb lam   TUBAL LIGATION     Essure   Patient Active Problem List   Diagnosis Date Noted   Facial rash 02/28/2022   Tinnitus of both ears 06/09/2020   Acute gastritis 08/03/2019   Chronic left hip pain 12/02/2017   Chronic back pain 12/02/2017   Class 1 obesity due to excess calories with body mass index (BMI) of 34.0 to 34.9 in adult 12/08/2014   Varicose veins of both legs with  edema 12/08/2014   Insomnia 02/18/2012   MDD (recurrent major depressive disorder) in remission (HCC) 01/16/2012   TRANSAMINASES, SERUM, ELEVATED 05/27/2008   NEPHROLITHIASIS, HX OF 05/23/2008   SLEEP APNEA 05/30/2007   NECK PAIN, CHRONIC 02/25/2007   Chest pain 02/25/2007    PCP: Lula Olszewski, MD  REFERRING PROVIDER: Lula Olszewski, MD  REFERRING DIAG: M25.551,M25.552,G89.29 (ICD-10-CM) - Chronic hip pain, bilateral  Rationale for Evaluation and Treatment: Rehabilitation  THERAPY DIAG:  Bilateral hip pain  Difficulty in walking, not elsewhere classified  Muscle weakness (generalized)  ONSET DATE: months but has worsened over the last couple months  SUBJECTIVE:  SUBJECTIVE STATEMENT: 12/16/2022 States she got her ball and overall feeling better. Slightly sore at times but feeling much better. No current pain. States she tried the cushion for her hip in side sleeping and it helped.  Pain at night, can't sleep. Steps a wrong way and then has pain. Starts to feel better and then does too much and it comes back. States she sometimes has a pinch and it will stay there. Reprots it will go down the back of her thigh but doesn't really go to the calf. States her back gets tired and then her legs start to hurt L>R .  States laying down causes pain, slight elevation of her legs helps her a little bit.  States she would like to sleep on her side but then she ends up on her back. States that when she lays on her side it is always hurts on the left. States she hasn't tried adding an extra pillow between her legs. States she has tried her Scientist, clinical (histocompatibility and immunogenetics) and that helped.   States that she has tried her gabapentin and that helps a little bit. Has to lift her leg in and out of the car when her pain is really  bad. PERTINENT HISTORY:  lumbar surgery 71' (clean out)  PAIN:  Are you having pain? Yes: NPRS scale: 0/10 Pain location: inside of left thigh Pain description: achy Aggravating factors: laying down, walking, step differently Relieving factors: changing positions, pool, massager    PRECAUTIONS: None  RED FLAGS: None   WEIGHT BEARING RESTRICTIONS: No  FALLS:  Has patient fallen in last 6 months? 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 Lumbar spine  IMPRESSION: Mild degenerative disc disease at L4-L5 and L5-S1.  PATIENT SURVEYS:  FOTO 7%     COGNITION: Overall cognitive status: Within functional limits for tasks assessed     SENSATION: Not tested Observation: Pitting edema 4+ bilaterally in lower extremities  POSTURE: rounded shoulders, forward head, flexed trunk , and limited hip extension  PALPATION: Tenderness to palpation along left piriformis and glutes increased resting tone throughout bilateral glutes  LUMBAR ROM:   AROM eval  Flexion 50% limited  Extension 75% limited pain on left low back  Right lateral flexion 75% limited felt stuck  Left lateral flexion 25% limited   Right rotation   Left rotation    (Blank rows = not tested)    LE Measurements Lower Extremity Right EVAL Left EVAL   A/PROM MMT A/PROM MMT  Hip Flexion 100* 3* 100* 3*  Hip Extension      Hip Abduction      Hip Adduction      Hip Internal rotation 30  15*   Hip External rotation 45  30*   Knee Flexion  3+  3  Knee Extension  4-  3+  Ankle Dorsiflexion  4-  4-  Ankle Plantarflexion      Ankle Inversion      Ankle Eversion       (Blank rows = not tested) * pain Muscle  guarding throughout session difficult to determine true range of motion and strength  LUMBAR SPECIAL TESTS:  Slump neg B Traction - relief  Negative straight leg raise test bilaterally  TODAY'S TREATMENT:                                                                                                                              DATE:   12/16/2022  Therapeutic Exercise:  Aerobic: Supine: hip IR and ER bent knee fall outs 2 minutes each B, piriformis stretch 5 minutes with strap, PROM left hip all directions 10 minutes    Prone:  Seated: lumbar flexion x5 10" holds  Standing: L stretch x5 10" holds  Neuromuscular Re-education: Manual Therapy: lateral and inferior mobilization left hip grade II tolerated well - reduced stiffness afterwards 15 minutes Therapeutic Activity: Self Care: Trigger Point Dry Needling:  Modalities:    PATIENT EDUCATION:  Education details: on HEP Person educated: Patient Education method: Programmer, multimedia, Demonstration, and Handouts Education comprehension: verbalized understanding   HOME EXERCISE PROGRAM: [id number]  ASSESSMENT:  CLINICAL IMPRESSION: 12/16/2022 Focused on manual work which was tolerated very well. Improved hip ROM noted afterwards and able to perform piriformis stretch afterwards. Added this to HEP with use of strap for support. Soreness noted end of session and slight tightness in back but this reduced with stretching will continue with current POC as tolerated.    Eval: Patient presents to physical therapy with complaints of chronic low back pain but recent increase in pain over the last few months.  Pain radiates down legs and is worse with all positions and movements but predominantly worse in supine position.  Patient heavily guarded throughout today's session making testing difficult.  Patient did tolerate lumbar traction well noting reduced pain afterwards.  Educated patient in ways to achieve lumbar decompression throughout the day  and at the end of the day.  Patient would greatly benefit from skilled PT to improve physical impairments of strength, range of motion and postural limitations.  OBJECTIVE IMPAIRMENTS: decreased activity tolerance, decreased mobility, difficulty walking, decreased ROM, decreased strength, increased edema, improper body mechanics, postural dysfunction, and pain.   ACTIVITY LIMITATIONS: carrying, lifting, bending, sitting, standing, squatting, sleeping, stairs, transfers, bed mobility, dressing, reach over head, and locomotion level  PARTICIPATION LIMITATIONS: meal prep, cleaning, driving, community activity, and occupation  PERSONAL FACTORS: Fitness, Profession, Time since onset of injury/illness/exacerbation, and 1 comorbidity: History of lumbar surgery  are also affecting patient's functional outcome.   REHAB POTENTIAL: Good  CLINICAL DECISION MAKING: Stable/uncomplicated  EVALUATION COMPLEXITY: Low   GOALS: Goals reviewed with patient? yes  SHORT TERM GOALS: Target date: 01/15/2023  Patient will be independent in self management strategies to improve quality of life and functional outcomes. Baseline: New Program Goal status: INITIAL  2.  Patient will report at least 50% improvement in overall symptoms and/or function to demonstrate improved functional mobility Baseline: 0% better Goal status: INITIAL  3.  Patient will report improved ergonomic set up at work to reduce stress on lumbar spine Baseline: Not currently Goal status: INITIAL  4.  Patient will demonstrate  pain free lumbar range of motion to improve gross lumbar mobility Baseline: Painful Goal status: INITIAL    LONG TERM GOALS: Target date: 02/26/2023   Patient will report at least 75% improvement in overall symptoms and/or function to demonstrate improved functional mobility Baseline: 0% better Goal status: INITIAL  2.  Patient will improve score on FOTO outcomes measure to projected score to demonstrate overall  improved function and QOL Baseline: see above Goal status: INITIAL  3.  Patient will demonstrate at least 4/5 MMT throughout lower extremities Baseline: See above Goal status: INITIAL  4.  Patient will be able to walk for 15 to 20 minutes at a time without severe pain to return to walking her dog if she chooses to Baseline: Unable Goal status: INITIAL   PLAN:  PT FREQUENCY: 1-2x/week for total of 16 visits over 12 weeks certification period  PT DURATION: 12 weeks  PLANNED INTERVENTIONS: Therapeutic exercises, Therapeutic activity, Neuromuscular re-education, Balance training, Gait training, Patient/Family education, Self Care, Joint mobilization, Joint manipulation, Stair training, Vestibular training, Canalith repositioning, Orthotic/Fit training, Prosthetic training, DME instructions, Aquatic Therapy, Dry Needling, Electrical stimulation, Spinal manipulation, Spinal mobilization, Cryotherapy, Moist heat, Taping, Traction, Ultrasound, Ionotophoresis 4mg /ml Dexamethasone, Manual therapy, and Re-evaluation.   PLAN FOR NEXT SESSION: Breathing exercises, down-regulation, ergonomic set up, range of motion and isometrics, follow-up with traction, vibration therapy   2:13 PM, 12/16/22 Tereasa Coop, DPT Physical Therapy with [facility]

## 2022-12-17 ENCOUNTER — Other Ambulatory Visit (INDEPENDENT_AMBULATORY_CARE_PROVIDER_SITE_OTHER): Payer: Managed Care, Other (non HMO)

## 2022-12-17 DIAGNOSIS — Z13220 Encounter for screening for lipoid disorders: Secondary | ICD-10-CM | POA: Diagnosis not present

## 2022-12-17 LAB — COMPREHENSIVE METABOLIC PANEL
ALT: 27 U/L (ref 0–35)
AST: 22 U/L (ref 0–37)
Albumin: 4.2 g/dL (ref 3.5–5.2)
Alkaline Phosphatase: 81 U/L (ref 39–117)
BUN: 12 mg/dL (ref 6–23)
CO2: 26 mEq/L (ref 19–32)
Calcium: 9.4 mg/dL (ref 8.4–10.5)
Chloride: 106 mEq/L (ref 96–112)
Creatinine, Ser: 0.85 mg/dL (ref 0.40–1.20)
GFR: 76.65 mL/min (ref >60.00)
Glucose, Bld: 90 mg/dL (ref 70–99)
Potassium: 3.7 mEq/L (ref 3.5–5.1)
Sodium: 140 mEq/L (ref 135–145)
Total Bilirubin: 0.4 mg/dL (ref 0.2–1.2)
Total Protein: 7 g/dL (ref 6.0–8.3)

## 2022-12-17 LAB — LIPID PANEL
Cholesterol: 222 mg/dL — ABNORMAL HIGH (ref 0–200)
HDL: 60.7 mg/dL (ref >39.00)
LDL Cholesterol: 136 mg/dL — ABNORMAL HIGH (ref 0–99)
NonHDL: 161.5
Total CHOL/HDL Ratio: 4
Triglycerides: 129 mg/dL (ref 0.0–149.0)
VLDL: 25.8 mg/dL (ref 0.0–40.0)

## 2022-12-17 NOTE — Progress Notes (Signed)
No critical labs need to be addressed urgently. We will discuss labs in detail at upcoming office visit.   

## 2022-12-23 ENCOUNTER — Ambulatory Visit: Payer: Managed Care, Other (non HMO) | Admitting: Physical Therapy

## 2022-12-23 ENCOUNTER — Encounter: Payer: Self-pay | Admitting: Physical Therapy

## 2022-12-23 DIAGNOSIS — M25551 Pain in right hip: Secondary | ICD-10-CM | POA: Diagnosis not present

## 2022-12-23 DIAGNOSIS — R262 Difficulty in walking, not elsewhere classified: Secondary | ICD-10-CM

## 2022-12-23 DIAGNOSIS — M6281 Muscle weakness (generalized): Secondary | ICD-10-CM

## 2022-12-23 DIAGNOSIS — M25552 Pain in left hip: Secondary | ICD-10-CM | POA: Diagnosis not present

## 2022-12-23 NOTE — Therapy (Signed)
OUTPATIENT PHYSICAL THERAPY THORACOLUMBAR TREATMENT   Patient Name: Makiba Butterworth MRN: [id number] DOB:18-Feb-1966, 57 y.o., female Today's Date: 12/23/2022  END OF SESSION:  PT End of Session - 12/23/22 1300     Visit Number 5    Number of Visits 16    Date for PT Re-Evaluation 02/26/23    Authorization Type cigna VL 50 combine with OT/SP    Authorization - Visit Number 5    Authorization - Number of Visits 50    PT Start Time 1302    PT Stop Time 1340    PT Time Calculation (min) 38 min    Activity Tolerance Patient limited by pain    Behavior During Therapy [facility] for tasks assessed/performed              Past Medical History:  Diagnosis Date   Arthritis    Gallstones    History of kidney stones    Lumbar back pain 06/28/2013   Nephrolithiasis    Urolithiasis    Wears glasses    Past Surgical History:  Procedure Laterality Date   ANKLE ARTHRODESIS  1981   left ankle bone tumor-   BLADDER SUSPENSION  2008   sling-cysto   breast biopsy  2002   lt br bx   BREAST CYST EXCISION Left 02/19/2013   Procedure:  EXCISION BREAST MASS;  Surgeon: Maisie Fus A. Cornett, MD;  Location: [facility];  Service: General;  Laterality: Left;   BREAST EXCISIONAL BIOPSY Left 2014   benign   BREAST SURGERY     CHOLECYSTECTOMY N/A 08/27/2017   Procedure: LAPAROSCOPIC CHOLECYSTECTOMY WITH INTRAOPERATIVE CHOLANGIOGRAM;  Surgeon: Griselda Miner, MD;  Location: [facility] OR;  Service: General;  Laterality: N/A;   DILATION AND CURETTAGE OF UTERUS     LUMBAR DISC SURGERY  1997   lumb lam   TUBAL LIGATION     Essure   Patient Active Problem List   Diagnosis Date Noted   Facial rash 02/28/2022   Tinnitus of both ears 06/09/2020   Acute gastritis 08/03/2019   Chronic left hip pain 12/02/2017   Chronic back pain 12/02/2017   Class 1 obesity due to excess calories with body mass index (BMI) of 34.0 to 34.9 in adult 12/08/2014   Varicose veins of both legs with edema 12/08/2014    Insomnia 02/18/2012   MDD (recurrent major depressive disorder) in remission (HCC) 01/16/2012   TRANSAMINASES, SERUM, ELEVATED 05/27/2008   NEPHROLITHIASIS, HX OF 05/23/2008   SLEEP APNEA 05/30/2007   NECK PAIN, CHRONIC 02/25/2007   Chest pain 02/25/2007    PCP: Lula Olszewski, MD  REFERRING PROVIDER: Lula Olszewski, MD  REFERRING DIAG: M25.551,M25.552,G89.29 (ICD-10-CM) - Chronic hip pain, bilateral  Rationale for Evaluation and Treatment: Rehabilitation  THERAPY DIAG:  Bilateral hip pain  Difficulty in walking, not elsewhere classified  Muscle weakness (generalized)  ONSET DATE: months but has worsened over the last couple months  SUBJECTIVE:  SUBJECTIVE STATEMENT: 12/23/2022 Still having some pain at night but not as bad and hip/leg gets tired easily.  Pain at night, can't sleep. Steps a wrong way and then has pain. Starts to feel better and then does too much and it comes back. States she sometimes has a pinch and it will stay there. Reprots it will go down the back of her thigh but doesn't really go to the calf. States her back gets tired and then her legs start to hurt L>R .  States laying down causes pain, slight elevation of her legs helps her a little bit.  States she would like to sleep on her side but then she ends up on her back. States that when she lays on her side it is always hurts on the left. States she hasn't tried adding an extra pillow between her legs. States she has tried her Scientist, clinical (histocompatibility and immunogenetics) and that helped.   States that she has tried her gabapentin and that helps a little bit. Has to lift her leg in and out of the car when her pain is really bad. PERTINENT HISTORY:  lumbar surgery 62' (clean out)  PAIN:  Are you having pain? Yes: NPRS scale: 0/10 Pain location: inside of  left thigh Pain description: achy Aggravating factors: laying down, walking, step differently Relieving factors: changing positions, pool, massager    PRECAUTIONS: None  RED FLAGS: None   WEIGHT BEARING RESTRICTIONS: No  FALLS:  Has patient fallen in last 6 months? 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 Lumbar spine  IMPRESSION: Mild degenerative disc disease at L4-L5 and L5-S1.  PATIENT SURVEYS:  FOTO 7%     COGNITION: Overall cognitive status: Within functional limits for tasks assessed     SENSATION: Not tested Observation: Pitting edema 4+ bilaterally in lower extremities  POSTURE: rounded shoulders, forward head, flexed trunk , and limited hip extension  PALPATION: Tenderness to palpation along left piriformis and glutes increased resting tone throughout bilateral glutes  LUMBAR ROM:   AROM eval  Flexion 50% limited  Extension 75% limited pain on left low back  Right lateral flexion 75% limited felt stuck  Left lateral flexion 25% limited   Right rotation   Left rotation    (Blank rows = not tested)    LE Measurements Lower Extremity Right EVAL Left EVAL   A/PROM MMT A/PROM MMT  Hip Flexion 100* 3* 100* 3*  Hip Extension      Hip Abduction      Hip Adduction      Hip Internal rotation 30  15*   Hip External rotation 45  30*   Knee Flexion  3+  3  Knee Extension  4-  3+  Ankle Dorsiflexion  4-  4-  Ankle Plantarflexion      Ankle Inversion      Ankle Eversion       (Blank rows = not tested) * pain Muscle guarding throughout session difficult to determine true range of motion and strength  LUMBAR SPECIAL TESTS:  Slump neg B Traction - relief    Negative straight leg raise test bilaterally  TODAY'S TREATMENT:  DATE:   12/23/2022  Therapeutic Exercise:  Aerobic: Supine: hip IR and ER bent knee fall outs 4 minutes each B, hip add iso 2 minutes, hip abd iso 2 minutes     S/l: clamshell and rev 2x10 B and each  Seated: piriformis stretch 30" holds x3 B, lumbar flexion x5 10" holds  Standing:  Neuromuscular Re-education: Manual Therapy: lateral and inferior mobilization left hip grade II tolerated well - reduced stiffness afterwards 12 minutes Therapeutic Activity: Self Care: Trigger Point Dry Needling:  Modalities:    PATIENT EDUCATION:  Education details: on HEP Person educated: Patient Education method: Programmer, multimedia, Demonstration, and Handouts Education comprehension: verbalized understanding   HOME EXERCISE PROGRAM: [phone]  ASSESSMENT:  CLINICAL IMPRESSION: 12/23/2022 Added strengthening exercises on this date. Overall tolerated moderately well but slight pain noted towards end of last rep and set of left sided clamshells. Added new exercises to HEP but discussed not performing if sore after these exercises tomorrow. Lateral mobilizations tolerated well and improved mobility noted afterwards. No pain noted end of session.  Eval: Patient presents to physical therapy with complaints of chronic low back pain but recent increase in pain over the last few months.  Pain radiates down legs and is worse with all positions and movements but predominantly worse in supine position.  Patient heavily guarded throughout today's session making testing difficult.  Patient did tolerate lumbar traction well noting reduced pain afterwards.  Educated patient in ways to achieve lumbar decompression throughout the day and at the end of the day.  Patient would greatly benefit from skilled PT to improve physical  impairments of strength, range of motion and postural limitations.  OBJECTIVE IMPAIRMENTS: decreased activity tolerance, decreased mobility, difficulty walking, decreased ROM, decreased strength, increased edema, improper body mechanics, postural dysfunction, and pain.   ACTIVITY LIMITATIONS: carrying, lifting, bending, sitting, standing, squatting, sleeping, stairs, transfers, bed mobility, dressing, reach over head, and locomotion level  PARTICIPATION LIMITATIONS: meal prep, cleaning, driving, community activity, and occupation  PERSONAL FACTORS: Fitness, Profession, Time since onset of injury/illness/exacerbation, and 1 comorbidity: History of lumbar surgery  are also affecting patient's functional outcome.   REHAB POTENTIAL: Good  CLINICAL DECISION MAKING: Stable/uncomplicated  EVALUATION COMPLEXITY: Low   GOALS: Goals reviewed with patient? yes  SHORT TERM GOALS: Target date: 01/15/2023  Patient will be independent in self management strategies to improve quality of life and functional outcomes. Baseline: New Program Goal status: INITIAL  2.  Patient will report at least 50% improvement in overall symptoms and/or function to demonstrate improved functional mobility Baseline: 0% better Goal status: INITIAL  3.  Patient will report improved ergonomic set up at work to reduce stress on lumbar spine Baseline: Not currently Goal status: INITIAL  4.  Patient will demonstrate pain free lumbar range of motion to improve gross lumbar mobility Baseline: Painful Goal status: INITIAL    LONG TERM GOALS: Target date: 02/26/2023   Patient will report at least 75% improvement in overall symptoms and/or function to demonstrate improved functional mobility Baseline: 0% better Goal status: INITIAL  2.  Patient will improve score on FOTO outcomes measure to projected score to demonstrate overall improved function and QOL Baseline: see above Goal status: INITIAL  3.  Patient will  demonstrate at least 4/5 MMT throughout lower extremities Baseline: See above Goal status: INITIAL  4.  Patient will be able to walk for 15 to 20 minutes at a time without severe pain to return to walking her dog if she chooses to Baseline:  Unable Goal status: INITIAL   PLAN:  PT FREQUENCY: 1-2x/week for total of 16 visits over 12 weeks certification period  PT DURATION: 12 weeks  PLANNED INTERVENTIONS: Therapeutic exercises, Therapeutic activity, Neuromuscular re-education, Balance training, Gait training, Patient/Family education, Self Care, Joint mobilization, Joint manipulation, Stair training, Vestibular training, Canalith repositioning, Orthotic/Fit training, Prosthetic training, DME instructions, Aquatic Therapy, Dry Needling, Electrical stimulation, Spinal manipulation, Spinal mobilization, Cryotherapy, Moist heat, Taping, Traction, Ultrasound, Ionotophoresis 4mg /ml Dexamethasone, Manual therapy, and Re-evaluation.   PLAN FOR NEXT SESSION: Breathing exercises, down-regulation, ergonomic set up, range of motion and isometrics, follow-up with traction, vibration therapy   1:51 PM, 12/23/22 Tereasa Coop, DPT Physical Therapy with [facility]

## 2022-12-24 ENCOUNTER — Encounter: Payer: Self-pay | Admitting: Family Medicine

## 2022-12-24 ENCOUNTER — Ambulatory Visit (INDEPENDENT_AMBULATORY_CARE_PROVIDER_SITE_OTHER): Payer: Managed Care, Other (non HMO) | Admitting: Family Medicine

## 2022-12-24 VITALS — BP 118/72 | HR 72 | Temp 97.9°F | Ht 64.25 in | Wt 190.1 lb

## 2022-12-24 DIAGNOSIS — E6609 Other obesity due to excess calories: Secondary | ICD-10-CM

## 2022-12-24 DIAGNOSIS — Z0000 Encounter for general adult medical examination without abnormal findings: Secondary | ICD-10-CM | POA: Diagnosis not present

## 2022-12-24 DIAGNOSIS — F3340 Major depressive disorder, recurrent, in remission, unspecified: Secondary | ICD-10-CM | POA: Diagnosis not present

## 2022-12-24 DIAGNOSIS — H9193 Unspecified hearing loss, bilateral: Secondary | ICD-10-CM | POA: Insufficient documentation

## 2022-12-24 DIAGNOSIS — Z6834 Body mass index (BMI) 34.0-34.9, adult: Secondary | ICD-10-CM

## 2022-12-24 DIAGNOSIS — Z808 Family history of malignant neoplasm of other organs or systems: Secondary | ICD-10-CM

## 2022-12-24 NOTE — Assessment & Plan Note (Signed)
Recommended audiology evaluation and treatment

## 2022-12-24 NOTE — Assessment & Plan Note (Signed)
Chronic, well controlled in remission on no medication.

## 2022-12-24 NOTE — Progress Notes (Signed)
Patient ID: Kelsey Carlson, female    DOB: 1966/05/03, 57 y.o.   MRN: [id number]  This visit was conducted in person.  BP 118/72 (BP Location: Right Arm, Patient Position: Sitting, Cuff Size: Large)   Pulse 72   Temp 97.9 F (36.6 C) (Temporal)   Ht 5' 4.25" (1.632 m)   Wt 190 lb 2 oz (86.2 kg)   LMP 03/03/2021   SpO2 96%   BMI 32.38 kg/m    CC: Chief Complaint  Patient presents with   Annual Exam     HPI: Kelsey Carlson is a 57 y.o. female presenting on 12/24/2022 for Annual Exam She is doing well overall with no new concerns.   Diet: salads, water  Exercise: minimal.    Body mass index is 32.38 kg/m.  Wt Readings from Last 3 Encounters:  12/24/22 190 lb 2 oz (86.2 kg)  12/10/22 189 lb 6 oz (85.9 kg)  11/22/22 190 lb (86.2 kg)   Lab Results  Component Value Date   CHOL 222 (H) 12/17/2022   HDL 60.70 12/17/2022   LDLCALC 136 (H) 12/17/2022   TRIG 129.0 12/17/2022   CHOLHDL 4 12/17/2022   The 10-year ASCVD risk score (Arnett DK, et al., 2019) is: 1.9%   Values used to calculate the score:     Age: 37 years     Sex: Female     Is Non-Hispanic African American: No     Diabetic: No     Tobacco smoker: No     Systolic Blood Pressure: 118 mmHg     Is BP treated: No     HDL Cholesterol: 60.7 mg/dL     Total Cholesterol: 222 mg/dL  GERD: PPI as need.  Relevant past medical, surgical, family and social history reviewed and updated as indicated. Interim medical history since our last visit reviewed. Allergies and medications reviewed and updated. Outpatient Medications Prior to Visit  Medication Sig Dispense Refill   gabapentin (NEURONTIN) 300 MG capsule Take 300 mg by mouth daily as needed.     naproxen (NAPROSYN) 500 MG tablet Take 500 mg by mouth 2 (two) times daily as needed.     Cholecalciferol (VITAMIN D-3) 125 MCG (5000 UT) TABS Take 1 tablet by mouth daily.     naproxen (NAPROSYN) 500 MG tablet Take 1 tablet (500 mg total) by mouth 2 (two)  times daily with a meal. (Patient taking differently: Take 500 mg by mouth 2 (two) times daily as needed.) 60 tablet 0   No facility-administered medications prior to visit.     Per HPI unless specifically indicated in ROS section below Review of Systems  Constitutional:  Negative for fatigue and fever.  HENT:  Negative for congestion.   Eyes:  Negative for pain.  Respiratory:  Negative for cough and shortness of breath.   Cardiovascular:  Negative for chest pain, palpitations and leg swelling.  Gastrointestinal:  Negative for abdominal pain.  Genitourinary:  Negative for dysuria and vaginal bleeding.  Musculoskeletal:  Negative for back pain.  Neurological:  Negative for syncope, light-headedness and headaches.  Psychiatric/Behavioral:  Negative for dysphoric mood.    Objective:  BP 118/72 (BP Location: Right Arm, Patient Position: Sitting, Cuff Size: Large)   Pulse 72   Temp 97.9 F (36.6 C) (Temporal)   Ht 5' 4.25" (1.632 m)   Wt 190 lb 2 oz (86.2 kg)   LMP 03/03/2021   SpO2 96%   BMI 32.38 kg/m   Wt Readings from  Last 3 Encounters:  12/24/22 190 lb 2 oz (86.2 kg)  12/10/22 189 lb 6 oz (85.9 kg)  11/22/22 190 lb (86.2 kg)      Physical Exam Vitals and nursing note reviewed.  Constitutional:      General: She is not in acute distress.    Appearance: Normal appearance. She is well-developed. She is not ill-appearing or toxic-appearing.  HENT:     Head: Normocephalic.     Right Ear: Hearing, tympanic membrane, ear canal and external ear normal.     Left Ear: Hearing, tympanic membrane, ear canal and external ear normal.     Nose: Nose normal.  Eyes:     General: Lids are normal. Lids are everted, no foreign bodies appreciated.     Conjunctiva/sclera: Conjunctivae normal.     Pupils: Pupils are equal, round, and reactive to light.  Neck:     Thyroid: No thyroid mass or thyromegaly.     Vascular: No carotid bruit.     Trachea: Trachea normal.  Cardiovascular:      Rate and Rhythm: Normal rate and regular rhythm.     Heart sounds: Normal heart sounds, S1 normal and S2 normal. No murmur heard.    No gallop.  Pulmonary:     Effort: Pulmonary effort is normal. No respiratory distress.     Breath sounds: Normal breath sounds. No wheezing, rhonchi or rales.  Abdominal:     General: Bowel sounds are normal. There is no distension or abdominal bruit.     Palpations: Abdomen is soft. There is no fluid wave or mass.     Tenderness: There is no abdominal tenderness. There is no guarding or rebound.     Hernia: No hernia is present.  Musculoskeletal:     Cervical back: Normal range of motion and neck supple.  Lymphadenopathy:     Cervical: No cervical adenopathy.  Skin:    General: Skin is warm and dry.     Findings: No rash.  Neurological:     Mental Status: She is alert.     Cranial Nerves: No cranial nerve deficit.     Sensory: No sensory deficit.  Psychiatric:        Mood and Affect: Mood is not anxious or depressed.        Speech: Speech normal.        Behavior: Behavior normal. Behavior is cooperative.        Judgment: Judgment normal.       Results for orders placed or performed in visit on 12/17/22  Lipid panel  Result Value Ref Range   Cholesterol 222 (H) 0 - 200 mg/dL   Triglycerides 454.0 0.0 - 149.0 mg/dL   HDL 98.11 >91.47 mg/dL   VLDL 82.9 0.0 - 56.2 mg/dL   LDL Cholesterol 130 (H) 0 - 99 mg/dL   Total CHOL/HDL Ratio 4    NonHDL 161.50   Comprehensive metabolic panel  Result Value Ref Range   Sodium 140 135 - 145 mEq/L   Potassium 3.7 3.5 - 5.1 mEq/L   Chloride 106 96 - 112 mEq/L   CO2 26 19 - 32 mEq/L   Glucose, Bld 90 70 - 99 mg/dL   BUN 12 6 - 23 mg/dL   Creatinine, Ser 8.65 0.40 - 1.20 mg/dL   Total Bilirubin 0.4 0.2 - 1.2 mg/dL   Alkaline Phosphatase 81 39 - 117 U/L   AST 22 0 - 37 U/L   ALT 27 0 - 35 U/L  Total Protein 7.0 6.0 - 8.3 g/dL   Albumin 4.2 3.5 - 5.2 g/dL   GFR 54.09 >81.19 mL/min   Calcium 9.4 8.4 -  10.5 mg/dL     COVID 19 screen:  No recent travel or known exposure to COVID19 The patient denies respiratory symptoms of COVID 19 at this time. The importance of social distancing was discussed today.   Assessment and Plan   The patient's preventative maintenance and recommended screening tests for an annual wellness exam were reviewed in full today. Brought up to date unless services declined.  Counselled on the importance of diet, exercise, and its role in overall health and mortality. The patient's FH and SH was reviewed, including their home life, tobacco status, and drug and alcohol status.   Vaccines: Td due ( but refused), due for flu...refused, COVID x2, encouraged booster Pap/DVE:  Per GYN, last seen in 03/2021 Dr. Birder Robson Mammo:  Per GYN.. Mother with breast cancer died age 29.  BRCA1 and 2 testing Negative.  Done 03/2021 Colon:  2016 repeat in 10 years, Dr. Loreta Ave Smoking Status:none ETOH/ drug use:  rare/none Hep C done Problem List Items Addressed This Visit     Bilateral hearing loss    Recommended audiology evaluation and treatment      Class 1 obesity due to excess calories with body mass index (BMI) of 34.0 to 34.9 in adult    Encouraged exercise, weight loss, healthy eating habits.       MDD (recurrent major depressive disorder) in remission (HCC) (Chronic)    Chronic, well controlled in remission on no medication.      Other Visit Diagnoses     Routine general medical examination at a health care facility    -  Primary   Family history of skin cancer       Relevant Orders   Ambulatory referral to Dermatology         Kerby Nora, MD

## 2022-12-24 NOTE — Assessment & Plan Note (Signed)
Encouraged exercise, weight loss, healthy eating habits. ? ?

## 2022-12-24 NOTE — Patient Instructions (Addendum)
Increase walking as able.  Work on low cholesterol heart healthy diet.  Return for Td when ready or get at pharmacy.  Set up GYN OV and set up yearly mammogram.

## 2023-01-01 ENCOUNTER — Encounter: Payer: Self-pay | Admitting: Physical Therapy

## 2023-01-01 ENCOUNTER — Ambulatory Visit: Payer: Managed Care, Other (non HMO) | Admitting: Physical Therapy

## 2023-01-01 DIAGNOSIS — M25551 Pain in right hip: Secondary | ICD-10-CM

## 2023-01-01 DIAGNOSIS — M6281 Muscle weakness (generalized): Secondary | ICD-10-CM

## 2023-01-01 DIAGNOSIS — R262 Difficulty in walking, not elsewhere classified: Secondary | ICD-10-CM

## 2023-01-01 DIAGNOSIS — M25552 Pain in left hip: Secondary | ICD-10-CM | POA: Diagnosis not present

## 2023-01-01 NOTE — Therapy (Addendum)
OUTPATIENT PHYSICAL THERAPY THORACOLUMBAR TREATMENT PHYSICAL THERAPY DISCHARGE SUMMARY  Visits from Start of Care: 6  Current functional level related to goals / functional outcomes: Not formally assessed but patient doing well   Remaining deficits: Not formally assessed but patient doing well   Education / Equipment: Not formally assessed but patient doing well   Patient agrees to discharge. Patient goals were partially met. Patient is being discharged due to being pleased with the current functional level.   10:05 AM, 02/10/23 Tereasa Coop, DPT Physical Therapy with [facility]   Patient Name: Kelsey Carlson MRN: [id number] DOB:Jun 26, 1965, 57 y.o., female Today's Date: 01/01/2023  END OF SESSION:  PT End of Session - 01/01/23 1301     Visit Number 6    Number of Visits 16    Date for PT Re-Evaluation 02/26/23    Authorization Type cigna VL 50 combine with OT/SP    Authorization - Visit Number 6    Authorization - Number of Visits 50    PT Start Time 1305    PT Stop Time 1343    PT Time Calculation (min) 38 min    Activity Tolerance Patient limited by pain    Behavior During Therapy [facility] for tasks assessed/performed              Past Medical History:  Diagnosis Date   Arthritis    Gallstones    History of kidney stones    Lumbar back pain 06/28/2013   Nephrolithiasis    Urolithiasis    Wears glasses    Past Surgical History:  Procedure Laterality Date   ANKLE ARTHRODESIS  1981   left ankle bone tumor-   BLADDER SUSPENSION  2008   sling-cysto   breast biopsy  2002   lt br bx   BREAST CYST EXCISION Left 02/19/2013   Procedure:  EXCISION BREAST MASS;  Surgeon: Maisie Fus A. Cornett, MD;  Location: [facility];  Service: General;  Laterality: Left;   BREAST EXCISIONAL BIOPSY Left 2014   benign   BREAST SURGERY     CHOLECYSTECTOMY N/A 08/27/2017   Procedure: LAPAROSCOPIC CHOLECYSTECTOMY WITH INTRAOPERATIVE CHOLANGIOGRAM;  Surgeon: Griselda Miner, MD;  Location: [facility] OR;  Service: General;  Laterality: N/A;   DILATION AND CURETTAGE OF UTERUS     LUMBAR DISC SURGERY  1997   lumb lam   TUBAL LIGATION     [place]   Patient Active Problem List   Diagnosis Date Noted   Bilateral hearing loss 12/24/2022   Facial rash 02/28/2022   Tinnitus of both ears 06/09/2020   Acute gastritis 08/03/2019   Chronic left hip pain 12/02/2017   Chronic back pain 12/02/2017   Class 1 obesity due to excess calories with body mass index (BMI) of 34.0 to 34.9 in adult 12/08/2014   Varicose veins of both legs with edema 12/08/2014   Insomnia 02/18/2012   MDD (recurrent major depressive disorder) in remission (HCC) 01/16/2012   TRANSAMINASES, SERUM, ELEVATED 05/27/2008   NEPHROLITHIASIS, HX OF 05/23/2008   SLEEP APNEA 05/30/2007   NECK PAIN, CHRONIC 02/25/2007   Chest pain 02/25/2007    PCP: Lula Olszewski, MD  REFERRING PROVIDER: Lula Olszewski, MD  REFERRING DIAG: M25.551,M25.552,G89.29 (ICD-10-CM) - Chronic hip pain, bilateral  Rationale for Evaluation and Treatment: Rehabilitation  THERAPY DIAG:  Bilateral hip pain  Difficulty in walking, not elsewhere classified  Muscle weakness (generalized)  ONSET DATE: months but has worsened over the last couple months  SUBJECTIVE:  SUBJECTIVE STATEMENT: 01/01/2023 States she can pick up items without pain and her pain at night is not as bad. States overall she feels better but has occasional soreness in hips.   Pain at night, can't sleep. Steps a wrong way and then has pain. Starts to feel better and then does too much and it comes back. States she sometimes has a pinch and it will stay there. Reprots it will go down the back of her thigh but doesn't really go to the calf. States her back gets tired and  then her legs start to hurt L>R .  States laying down causes pain, slight elevation of her legs helps her a little bit.  States she would like to sleep on her side but then she ends up on her back. States that when she lays on her side it is always hurts on the left. States she hasn't tried adding an extra pillow between her legs. States she has tried her Scientist, clinical (histocompatibility and immunogenetics) and that helped.   States that she has tried her gabapentin and that helps a little bit. Has to lift her leg in and out of the car when her pain is really bad. PERTINENT HISTORY:  lumbar surgery 45' (clean out)  PAIN:  Are you having pain? Yes: NPRS scale: 0/10 Pain location: inside of left thigh Pain description: achy Aggravating factors: laying down, walking, step differently Relieving factors: changing positions, pool, massager    PRECAUTIONS: None  RED FLAGS: None   WEIGHT BEARING RESTRICTIONS: No  FALLS:  Has patient fallen in last 6 months? 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 Lumbar spine  IMPRESSION: Mild degenerative disc disease at L4-L5 and L5-S1.  PATIENT SURVEYS:  FOTO 7%     COGNITION: Overall cognitive status: Within functional limits for tasks assessed     SENSATION: Not tested Observation: Pitting edema 4+ bilaterally in lower extremities  POSTURE: rounded shoulders, forward head, flexed trunk , and limited hip extension  PALPATION: Tenderness to palpation along left piriformis and glutes increased resting tone throughout bilateral glutes  LUMBAR ROM:   AROM eval  Flexion 50% limited  Extension 75% limited pain on left low back  Right lateral flexion 75% limited felt stuck  Left  lateral flexion 25% limited   Right rotation   Left rotation    (Blank rows = not tested)    LE Measurements Lower Extremity Right EVAL Left EVAL   A/PROM MMT A/PROM MMT  Hip Flexion 100* 3* 100* 3*  Hip Extension      Hip Abduction      Hip Adduction      Hip Internal rotation 45  15*   Hip External rotation 45  30*   Knee Flexion  3+  3  Knee Extension  4-  3+  Ankle Dorsiflexion  4-  4-  Ankle Plantarflexion      Ankle Inversion      Ankle Eversion       (Blank rows = not tested) * pain Muscle guarding throughout session difficult to determine true range of motion and strength  LUMBAR SPECIAL TESTS:  Slump neg B Traction - relief   Negative straight leg raise  test bilaterally  TODAY'S TREATMENT:                                                                                                                              DATE:   01/01/2023  Therapeutic Exercise:  Aerobic: Supine: hip IR and ER bent knee fall outs 2 minutes each B, bridge with post pelvic tilt and breathing x4 rounds of 45 second bouts with 5 second holds, hip abd 3x10 B   Prone: lying3 minutes, PROM hip IR/ER 5 minutes B, hamstring curls 2x10 B   Neuromuscular Re-education: Manual Therapy: PA mobilizations to left hip grade II 5 minutes Therapeutic Activity: Self Care: Trigger Point Dry Needling:  Modalities:    PATIENT EDUCATION:  Education details: on HEP Person educated: Patient Education method: Explanation, Demonstration, and Handouts Education comprehension: verbalized understanding   HOME EXERCISE PROGRAM: [phone]  ASSESSMENT:  CLINICAL IMPRESSION: 01/01/2023 Progressed exercises today. Hamstring cramping noted initially but this resolved with repetition. Fatigue in legs noted end of session. No pain noted overall patient doing very well. Will continue progressing as able.   Eval: Patient presents to physical therapy with complaints of chronic low back pain but recent increase  in pain over the last few months.  Pain radiates down legs and is worse with all positions and movements but predominantly worse in supine position.  Patient heavily guarded throughout today's session making testing difficult.  Patient did tolerate lumbar traction well noting reduced pain afterwards.  Educated patient in ways to achieve lumbar decompression throughout the day and at the end of the day.  Patient would greatly benefit from skilled PT to improve physical impairments of strength, range of motion and postural limitations.  OBJECTIVE IMPAIRMENTS: decreased activity tolerance, decreased mobility, difficulty walking, decreased ROM, decreased strength, increased edema, improper body mechanics, postural dysfunction, and pain.   ACTIVITY LIMITATIONS: carrying, lifting, bending, sitting, standing, squatting, sleeping, stairs, transfers, bed mobility, dressing, reach over head, and locomotion level  PARTICIPATION LIMITATIONS: meal prep, cleaning, driving, community activity, and occupation  PERSONAL FACTORS: Fitness, Profession, Time since onset of injury/illness/exacerbation, and 1 comorbidity: History of lumbar surgery  are also affecting patient's functional outcome.   REHAB POTENTIAL: Good  CLINICAL DECISION MAKING: Stable/uncomplicated  EVALUATION COMPLEXITY: Low   GOALS: Goals reviewed with patient? yes  SHORT TERM GOALS: Target date: 01/15/2023  Patient will be independent in self management strategies to improve quality of life and functional outcomes. Baseline: New Program Goal status: INITIAL  2.  Patient will report at least 50% improvement in overall symptoms and/or function to demonstrate improved functional mobility Baseline: 0% better Goal status: INITIAL  3.  Patient will report improved ergonomic set up at work to reduce stress on lumbar spine Baseline: Not currently Goal status: INITIAL  4.  Patient will demonstrate pain free lumbar range of motion to improve  gross lumbar mobility Baseline: Painful Goal status: INITIAL    LONG TERM GOALS: Target date: 02/26/2023  Patient will report at least 75% improvement in overall symptoms and/or function to demonstrate improved functional mobility Baseline: 0% better Goal status: INITIAL  2.  Patient will improve score on FOTO outcomes measure to projected score to demonstrate overall improved function and QOL Baseline: see above Goal status: MET -67% - recorded over phone on 02/10/23  3.  Patient will demonstrate at least 4/5 MMT throughout lower extremities Baseline: See above Goal status: INITIAL  4.  Patient will be able to walk for 15 to 20 minutes at a time without severe pain to return to walking her dog if she chooses to Baseline: Unable Goal status: INITIAL   PLAN:  PT FREQUENCY: 1-2x/week for total of 16 visits over 12 weeks certification period  PT DURATION: 12 weeks  PLANNED INTERVENTIONS: Therapeutic exercises, Therapeutic activity, Neuromuscular re-education, Balance training, Gait training, Patient/Family education, Self Care, Joint mobilization, Joint manipulation, Stair training, Vestibular training, Canalith repositioning, Orthotic/Fit training, Prosthetic training, DME instructions, Aquatic Therapy, Dry Needling, Electrical stimulation, Spinal manipulation, Spinal mobilization, Cryotherapy, Moist heat, Taping, Traction, Ultrasound, Ionotophoresis 4mg /ml Dexamethasone, Manual therapy, and Re-evaluation.   PLAN FOR NEXT SESSION: Breathing exercises, down-regulation, ergonomic set up, range of motion and isometrics, follow-up with traction, vibration therapy   1:48 PM, 01/01/23 Tereasa Coop, DPT Physical Therapy with [facility]

## 2023-01-15 ENCOUNTER — Encounter: Payer: Managed Care, Other (non HMO) | Admitting: Physical Therapy

## 2023-01-21 ENCOUNTER — Other Ambulatory Visit: Payer: Self-pay | Admitting: Obstetrics and Gynecology

## 2023-01-21 DIAGNOSIS — Z1231 Encounter for screening mammogram for malignant neoplasm of breast: Secondary | ICD-10-CM

## 2023-05-23 ENCOUNTER — Other Ambulatory Visit: Payer: Self-pay | Admitting: Surgery

## 2023-05-28 LAB — DERMATOLOGY PATHOLOGY

## 2023-05-29 ENCOUNTER — Encounter: Payer: Self-pay | Admitting: Surgery

## 2023-05-29 ENCOUNTER — Other Ambulatory Visit: Payer: Self-pay | Admitting: Surgery

## 2023-05-29 DIAGNOSIS — N6452 Nipple discharge: Secondary | ICD-10-CM

## 2023-06-02 ENCOUNTER — Telehealth: Payer: Self-pay | Admitting: Radiation Oncology

## 2023-06-02 ENCOUNTER — Ambulatory Visit
Admission: RE | Admit: 2023-06-02 | Discharge: 2023-06-02 | Disposition: A | Discharge status: Home / Self Care | Payer: Managed Care, Other (non HMO) | Source: Ambulatory Visit | Attending: Surgery | Admitting: Surgery

## 2023-06-02 DIAGNOSIS — N6452 Nipple discharge: Secondary | ICD-10-CM

## 2023-06-02 MED ORDER — GADOPICLENOL 0.5 MMOL/ML IV SOLN
8.0000 mL | Freq: Once | INTRAVENOUS | Status: AC | PRN
  Administered 2023-06-02: 8 mL via INTRAVENOUS

## 2023-06-02 NOTE — Telephone Encounter (Signed)
 Called pt to schedule CON per referral request from Dr. Vanderbilt. Pt states at this time she is unsure of her actual dx and unsure of tx plan; she does have upcoming visit with Dr. Vanderbilt scheduled 1/17 to go over all these. I advised pt I would f/u after 1/17 appt and verify plan. Pt was advised we can work with [facility] to try and scheduled appts close together if possible due to her availability. Pt was very understanding and grateful for explanation. I will f/u with her 1/17 to schedule CON with Dr. Dewey.

## 2023-06-09 DIAGNOSIS — C50812 Malignant neoplasm of overlapping sites of left female breast: Secondary | ICD-10-CM | POA: Insufficient documentation

## 2023-06-10 ENCOUNTER — Other Ambulatory Visit: Payer: Self-pay | Admitting: Hematology and Oncology

## 2023-06-10 ENCOUNTER — Inpatient Hospital Stay: Payer: Managed Care, Other (non HMO) | Attending: Hematology and Oncology | Admitting: Hematology and Oncology

## 2023-06-10 ENCOUNTER — Other Ambulatory Visit: Payer: Self-pay | Admitting: *Deleted

## 2023-06-10 ENCOUNTER — Inpatient Hospital Stay: Payer: Managed Care, Other (non HMO)

## 2023-06-10 ENCOUNTER — Encounter: Payer: Self-pay | Admitting: *Deleted

## 2023-06-10 VITALS — BP 157/73 | HR 86 | Temp 97.4°F | Resp 18 | Ht 64.25 in | Wt 189.7 lb

## 2023-06-10 DIAGNOSIS — R928 Other abnormal and inconclusive findings on diagnostic imaging of breast: Secondary | ICD-10-CM

## 2023-06-10 DIAGNOSIS — Z170 Estrogen receptor positive status [ER+]: Secondary | ICD-10-CM

## 2023-06-10 DIAGNOSIS — C50812 Malignant neoplasm of overlapping sites of left female breast: Secondary | ICD-10-CM | POA: Diagnosis not present

## 2023-06-10 MED ORDER — ANASTROZOLE 1 MG PO TABS: 1.0000 mg | ORAL_TABLET | Freq: Every day | ORAL | 3 refills | Status: DC

## 2023-06-10 NOTE — Progress Notes (Signed)
[facility] CONSULT NOTE  Patient Care Team: Excell Seltzer, MD as PCP - General  CHIEF COMPLAINTS/PURPOSE OF CONSULTATION:  Newly diagnosed breast cancer  HISTORY OF PRESENTING ILLNESS:  Kelsey Carlson is a 58 year old who was recently diagnosed with breast cancer.  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  This led to mammograms ultrasounds and breast MRI.  She was seen by Dr. Luisa Hart who referred her to [place] for discussion regarding neoadjuvant chemotherapy options.  She is here today accompanied by her husband.  She tells me that her mother had breast cancer.  She had genetic testing apparently about a year ago with her gynecologist and was told that she did not have any gene mutations.   I reviewed her records extensively and collaborated the history with the patient.  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 (HCC)   06/10/2023 Cancer Staging   Staging form: Breast, AJCC 8th Edition - Clinical: cT4, cN0, cM0, GX, ER+, PR+, HER2- - Signed by Serena Croissant, MD on 06/10/2023 Stage prefix: Initial diagnosis Histologic grading system: 3 grade system      MEDICAL HISTORY:  Past Medical History:  Diagnosis Date   Arthritis    Gallstones    History of kidney stones    Lumbar back pain 06/28/2013   Nephrolithiasis    Urolithiasis    Wears glasses     SURGICAL HISTORY: Past Surgical History:  Procedure Laterality Date   ANKLE ARTHRODESIS  1981   left ankle bone tumor-   BLADDER SUSPENSION  2008   sling-cysto   breast biopsy  2002   lt br bx   BREAST CYST EXCISION Left 02/19/2013   Procedure:  EXCISION BREAST MASS;  Surgeon: Maisie Fus A. Cornett, MD;  Location: [facility];  Service:  General;  Laterality: Left;   BREAST EXCISIONAL BIOPSY Left 2014   benign   BREAST SURGERY     CHOLECYSTECTOMY N/A 08/27/2017   Procedure: LAPAROSCOPIC CHOLECYSTECTOMY WITH INTRAOPERATIVE CHOLANGIOGRAM;  Surgeon: Griselda Miner, MD;  Location: [facility] OR;  Service: General;  Laterality: N/A;   DILATION AND CURETTAGE OF UTERUS     LUMBAR DISC SURGERY  1997   lumb lam   TUBAL LIGATION     Essure    SOCIAL HISTORY: Social History   Socioeconomic History   Marital status: Married    Spouse name: Not on file   Number of children: Not on file   Years of education: Not on file   Highest education level: Not on file  Occupational History    Employer: [employer]    Comment: Mailroom  Tobacco Use   Smoking status: Former    Current packs/day: 0.00    Types: Cigarettes    Quit date: 05/20/1994    Years since quitting: 29.0   Smokeless tobacco: Never  Vaping Use   Vaping status: Every Day  Substance and Sexual Activity   Alcohol use: Yes    Comment: socially   Drug use: No   Sexual activity: Yes    Birth control/protection: Surgical    Comment: Essure BTL  Other Topics Concern   Not on file  Social History Narrative   Regular exercise- yes 3 x a week  Diet- fruits and veggies   Social Drivers of Corporate investment banker Strain: Not on file  Food Insecurity: Not on file  Transportation Needs: Not on file  Physical Activity: Not on file  Stress: Not on file  Social Connections: Not on file  Intimate Partner Violence: Not on file    FAMILY HISTORY: Family History  Problem Relation Age of Onset   Cancer Mother 14       breast   Hypertension Father     ALLERGIES:  has no allergies on file.  MEDICATIONS:  Current Outpatient Medications  Medication Sig Dispense Refill   anastrozole (ARIMIDEX) 1 MG tablet Take 1 tablet (1 mg total) by mouth daily. 90 tablet 3   gabapentin (NEURONTIN) 300 MG capsule Take 300 mg by mouth daily as needed.     naproxen (NAPROSYN)  500 MG tablet Take 500 mg by mouth 2 (two) times daily as needed.     No current facility-administered medications for this visit.    REVIEW OF SYSTEMS:   Constitutional: Denies fevers, chills or abnormal night sweats Breast: Skin changes in the left breast around the nipple All other systems were reviewed with the patient and are negative.  PHYSICAL EXAMINATION: ECOG PERFORMANCE STATUS: 1 - Symptomatic but completely ambulatory  Vitals:   06/10/23 1303  BP: (!) 157/73  Pulse: 86  Resp: 18  Temp: (!) 97.4 F (36.3 C)  SpO2: 100%   Filed Weights   06/10/23 1303  Weight: 189 lb 11.2 oz (86 kg)    GENERAL:alert, no distress and comfortable    LABORATORY DATA:  I have reviewed the data as listed Lab Results  Component Value Date   WBC 7.3 08/19/2017   HGB 14.6 08/19/2017   HCT 43.3 08/19/2017   MCV 92.3 08/19/2017   PLT 361 08/19/2017   Lab Results  Component Value Date   NA 140 12/17/2022   K 3.7 12/17/2022   CL 106 12/17/2022   CO2 26 12/17/2022    RADIOGRAPHIC STUDIES: I have personally reviewed the radiological reports and agreed with the findings in the report.  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  Pathology and radiology counseling: Discussed with the patient, the details of pathology including the type of breast cancer,the clinical staging, the significance of ER, PR and HER-2/neu receptors and the implications for treatment. After reviewing the pathology in detail, we proceeded to discuss the different  treatment options between surgery, radiation, chemotherapy, antiestrogen therapies.  Treatment plan: Perform biopsy on the 5 cm of enhancement Obtain CT scans and bone scan Oncotype DX on the biopsy to determine if neoadjuvant chemotherapy should be considered. Mastectomy versus lumpectomy (patient wishes to do bilateral breast reductions if possible) Adjuvant radiation Adjuvant antiestrogen therapy  I recommended starting the patient now on antiestrogen therapy in anticipation of some delay with surgical planning. I would like to see her back after the scans are available and after the breast MRI guided biopsy is available.   All questions were answered. The patient knows to call the clinic with any problems, questions or concerns.  Tamsen Meek, MD 06/10/23

## 2023-06-10 NOTE — Assessment & Plan Note (Addendum)
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  Pathology and radiology counseling: Discussed with the patient, the details of pathology including the type of breast cancer,the clinical staging, the significance of ER, PR and HER-2/neu receptors and the implications for treatment. After reviewing the pathology in detail, we proceeded to discuss the different treatment options between surgery, radiation, chemotherapy, antiestrogen therapies.  Treatment plan: Performed biopsy on the 5 cm of enhancement Obtain CT scans and bone scan Consider Oncotype DX on the biopsy to determine if neoadjuvant chemotherapy should be considered. Mastectomy versus lumpectomy Adjuvant radiation Adjuvant antiestrogen therapy

## 2023-06-10 NOTE — Progress Notes (Signed)
New Breast Cancer Diagnosis: Left Breast  Patient presented with complaints of bloody nipple discharge for approximately 6 months.   Histology per Pathology Report: Infiltrating Carcinoma 05/23/2023  Receptor Status: ER(positive), PR (positive), Her2-neu (negative), Ki-(%)  Surgeon and surgical plan, if any:  Dr. Luisa Hart -Lumpectomy versus Mastectomy pending medical oncology recommendations.   Medical oncologist, treatment if any:   Dr. Pamelia Hoit 06/10/2023 -  Family History of Breast/Ovarian/Prostate Cancer:   Lymphedema issues, if any:      Pain issues, if any:     SAFETY ISSUES: Prior radiation?  Pacemaker/ICD?  Possible current pregnancy? Postmenopausal Is the patient on methotrexate?   Current Complaints / other details:

## 2023-06-11 ENCOUNTER — Ambulatory Visit: Payer: Managed Care, Other (non HMO)

## 2023-06-11 ENCOUNTER — Ambulatory Visit
Admission: RE | Admit: 2023-06-11 | Discharge: 2023-06-11 | Disposition: A | Discharge status: Home / Self Care | Payer: Managed Care, Other (non HMO) | Source: Ambulatory Visit | Attending: Radiation Oncology | Admitting: Radiation Oncology

## 2023-06-11 ENCOUNTER — Encounter: Payer: Self-pay | Admitting: Radiation Oncology

## 2023-06-11 ENCOUNTER — Ambulatory Visit
Admission: RE | Admit: 2023-06-11 | Discharge: 2023-06-11 | Discharge status: Home / Self Care | Payer: Managed Care, Other (non HMO) | Source: Ambulatory Visit | Attending: Radiation Oncology | Admitting: Radiation Oncology

## 2023-06-11 VITALS — BP 148/79 | HR 88 | Temp 97.8°F | Resp 20 | Ht 64.25 in | Wt 192.8 lb

## 2023-06-11 DIAGNOSIS — C50412 Malignant neoplasm of upper-outer quadrant of left female breast: Secondary | ICD-10-CM | POA: Diagnosis not present

## 2023-06-11 DIAGNOSIS — Z803 Family history of malignant neoplasm of breast: Secondary | ICD-10-CM | POA: Insufficient documentation

## 2023-06-11 DIAGNOSIS — Z9221 Personal history of antineoplastic chemotherapy: Secondary | ICD-10-CM | POA: Insufficient documentation

## 2023-06-11 DIAGNOSIS — Z79811 Long term (current) use of aromatase inhibitors: Secondary | ICD-10-CM | POA: Insufficient documentation

## 2023-06-11 DIAGNOSIS — C50812 Malignant neoplasm of overlapping sites of left female breast: Secondary | ICD-10-CM

## 2023-06-11 DIAGNOSIS — Z170 Estrogen receptor positive status [ER+]: Secondary | ICD-10-CM | POA: Insufficient documentation

## 2023-06-11 DIAGNOSIS — Z923 Personal history of irradiation: Secondary | ICD-10-CM | POA: Diagnosis not present

## 2023-06-11 DIAGNOSIS — Z87442 Personal history of urinary calculi: Secondary | ICD-10-CM | POA: Diagnosis not present

## 2023-06-11 DIAGNOSIS — Z87891 Personal history of nicotine dependence: Secondary | ICD-10-CM | POA: Insufficient documentation

## 2023-06-11 NOTE — Progress Notes (Signed)
Radiation Oncology         ([phone]) [phone] ________________________________  Name: Kelsey Carlson        MRN: [id number]  Date of Service: 06/11/2023 DOB: 07/28/1965  WU:[id number], Luberta Robertson, MD  Harriette Bouillon, MD     REFERRING PHYSICIAN: Harriette Bouillon, MD   DIAGNOSIS: The encounter diagnosis was Malignant neoplasm of overlapping sites of left breast in female, estrogen receptor positive (HCC).   HISTORY OF PRESENT ILLNESS: Kelsey Carlson is a 58 y.o. female seen at the request of Dr. Luisa Hart for a newly diagnosed breast cancer. The patient presented after having bloody nipple discharge for approximately 6 months time.  She had mammography at her OB/GYN office and this was read as benign, and she was evaluated on 05/05/2023 by Dr. Luisa Hart.  She underwent an excisional punch biopsy in the office on 05/23/2023 consistent with an infiltrating carcinoma of breast origin, additional testing showed that this tumor was ER/PR positive, HER2 negative with a Ki-67 not performed.  She was counseled on an MRI of the breasts which was performed on 06/02/2023 and showed a 2.8 cm biopsy-proven left nipple/retroareolar mass measuring 2.8 cm and 5 cm of irregular enhancement in the upper outer left breast 2-1/2 cm away from the retroareolar/nipple findings, and highly suspicious breast enhancement spanning into the upper outer quadrant of 9.8 cm distance, no evidence of adenopathy was appreciated.  No findings that were abnormal of the right breast were noted either.  She met back with Dr. 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  Surgical options include mastectomy versus breast conservation pending medical oncology's input whether they would give neoadjuvant treatment.  The patient may be interested in breast conservation and reduction if she were to keep her breast. An MRI on 06/20/23 showed up to 9.8 cm of enhancement in the left upper outer breast.  Additional biopsy has been recommended.  She is seen to discuss adjuvant treatment options.  Of note she met with Dr. Pamelia Hoit yesterday who recommends biopsy of the enhancing area on MRI, Oncotype Dx on her biopsy and CT staging. If her Oncotype Dx comes back high, neoadjuvant chemotherapy would be considered.   PREVIOUS RADIATION THERAPY: No   PAST MEDICAL HISTORY:  Past Medical History:  Diagnosis Date   Arthritis    Gallstones    History of kidney stones    Lumbar back pain 06/28/2013   Nephrolithiasis    Urolithiasis    Wears glasses        PAST SURGICAL HISTORY: Past Surgical History:  Procedure Laterality Date   ANKLE ARTHRODESIS  1981   left ankle bone tumor-   BLADDER SUSPENSION  2008   sling-cysto   breast biopsy  2002   lt br bx   BREAST CYST EXCISION Left 02/19/2013   Procedure:  EXCISION BREAST MASS;  Surgeon: Maisie Fus A. Cornett, MD;  Location: [facility];  Service: General;  Laterality: Left;   BREAST EXCISIONAL BIOPSY Left 2014   benign   BREAST SURGERY     CHOLECYSTECTOMY N/A 08/27/2017   Procedure: LAPAROSCOPIC CHOLECYSTECTOMY WITH INTRAOPERATIVE CHOLANGIOGRAM;  Surgeon: Griselda Miner, MD;  Location: [facility] OR;  Service: General;  Laterality: N/A;   DILATION AND CURETTAGE OF UTERUS     LUMBAR DISC SURGERY  1997   lumb lam   TUBAL LIGATION     Essure     FAMILY HISTORY:  Family History  Problem Relation Age of  Onset   Cancer Mother 31       breast   Hypertension Father      SOCIAL HISTORY:  reports that she quit smoking about 29 years ago. Her smoking use included cigarettes. She has never used smokeless tobacco. She reports current alcohol use. She reports that she does not use drugs.  The patient is married and lives in [place]. 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    ALLERGIES: Patient has no allergy information on record.   MEDICATIONS:  Current Outpatient Medications  Medication Sig  Dispense Refill   anastrozole (ARIMIDEX) 1 MG tablet Take 1 tablet (1 mg total) by mouth daily. 90 tablet 3   gabapentin (NEURONTIN) 300 MG capsule Take 300 mg by mouth daily as needed.     naproxen (NAPROSYN) 500 MG tablet Take 500 mg by mouth 2 (two) times daily as needed.     No current facility-administered medications for this visit.     REVIEW OF SYSTEMS: On review of systems, the patient reports that she is doing well overall. She is hopeful to have a final plan for her treatment soon. No breast specific complaints are otherwise verbalized.      PHYSICAL EXAM:  Wt Readings from Last 3 Encounters:  06/10/23 189 lb 11.2 oz (86 kg)  12/24/22 190 lb 2 oz (86.2 kg)  12/10/22 189 lb 6 oz (85.9 kg)   Temp Readings from Last 3 Encounters:  06/10/23 (!) 97.4 F (36.3 C) (Temporal)  12/24/22 97.9 F (36.6 C) (Temporal)  12/10/22 98.2 F (36.8 C) (Temporal)   BP Readings from Last 3 Encounters:  06/10/23 (!) 157/73  12/24/22 118/72  12/10/22 120/80   Pulse Readings from Last 3 Encounters:  06/10/23 86  12/24/22 72  12/10/22 83    In general this is a well appearing Caucasian female in no acute distress. She's alert and oriented x4 and appropriate throughout the examination. Cardiopulmonary assessment is negative for acute distress and she exhibits normal effort. Bilateral breast exam is deferred.    ECOG = 0  0 - Asymptomatic (Fully active, able to carry on all predisease activities without restriction)  1 - Symptomatic but completely ambulatory (Restricted in physically strenuous activity but ambulatory and able to carry out work of a light or sedentary nature. For example, light housework, office work)  2 - Symptomatic, <50% in bed during the day (Ambulatory and capable of all self care but unable to carry out any work activities. Up and about more than 50% of waking hours)  3 - Symptomatic, >50% in bed, but not bedbound (Capable of only limited self-care, confined to  bed or chair 50% or more of waking hours)  4 - Bedbound (Completely disabled. Cannot carry on any self-care. Totally confined to bed or chair)  5 - Death   Santiago Glad MM, Creech RH, Tormey DC, et al. [phone]). "Toxicity and response criteria of the [facility] Group". Am. Evlyn Clines. Oncol. 5 (6): 649-55    LABORATORY DATA:  Lab Results  Component Value Date   WBC 7.3 08/19/2017   HGB 14.6 08/19/2017   HCT 43.3 08/19/2017   MCV 92.3 08/19/2017   PLT 361 08/19/2017   Lab Results  Component Value Date   NA 140 12/17/2022   K 3.7 12/17/2022   CL 106 12/17/2022   CO2 26 12/17/2022   Lab Results  Component Value Date   ALT 27 12/17/2022   AST 22 12/17/2022  ALKPHOS 81 12/17/2022   BILITOT 0.4 12/17/2022      RADIOGRAPHY: MR BREAST BILATERAL W WO CONTRAST INC CAD Result Date: 06/02/2023 CLINICAL DATA:  58 year old female with recent LEFT nipple punch biopsy demonstrating infiltrating carcinoma, consistent with breast origin. EXAM: BILATERAL BREAST MRI WITH AND WITHOUT CONTRAST TECHNIQUE: Multiplanar, multisequence MR images of both breasts were obtained prior to and following the intravenous administration of 8 ml of Vueway Three-dimensional MR images were rendered by post-processing of the original MR data on an independent workstation. The three-dimensional MR images were interpreted, and findings are reported in the following complete MRI report for this study. Three dimensional images were evaluated at the independent interpreting workstation using the DynaCAD thin client. COMPARISON:  Prior mammograms FINDINGS: Breast composition: b. Scattered fibroglandular tissue. Background parenchymal enhancement: Mild Right breast: No suspicious mass or worrisome enhancement. Left breast: Irregular masslike enhancement involving the LEFT nipple and RETROAREOLAR region measures 2.2 x 3 x 2.8 cm (AP x transverse x CC) and compatible with biopsy-proven malignancy (images [id number]: Series  12). 2.5 cm posterosuperior to the biopsy-proven LEFT nipple/RETROAREOLAR malignancy above, lies a 3.5 x 3 x 5 cm patchy and confluent irregular mass and non masslike and enhancement within the middle depth UPPER-OUTER LEFT breast likely representing additional LEFT breast malignancy (44-81:12). The LEFT nipple/RETROAREOLAR malignancy and highly suspicious UPPER-OUTER LEFT breast enhancement span a distance of 9.8 cm. No other suspicious abnormalities within the LEFT breast identified. Lymph nodes: No abnormal appearing lymph nodes. Ancillary findings:  None. IMPRESSION: 1. 2.8 cm biopsy-proven malignancy involving the LEFT nipple/RETROAREOLAR LEFT breast. 2. 3.5 x 3 x 5 cm highly suspicious irregular enhancement within the UPPER-OUTER LEFT breast, located 2.5 cm posterosuperior to the biopsy-proven LEFT nipple/RETROAREOLAR malignancy. Tissue sampling recommended if breast conservation is considered. 3. The LEFT nipple/RETROAREOLAR malignancy and highly suspicious UPPER-OUTER LEFT breast enhancement span a distance of 9.8 cm. 4. No evidence of RIGHT breast malignancy or abnormal appearing lymph nodes. RECOMMENDATION: MR guided biopsy of highly suspicious UPPER-OUTER LEFT breast enhancement if breast conservation is desired. BI-RADS CATEGORY  5: Highly suggestive of malignancy. Electronically Signed   By: Harmon Pier M.D.   On: 06/02/2023 17:35       IMPRESSION/PLAN: 1. 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 Dr. Mitzi Hansen discusses the pathology findings and reviews the nature of early stage breast disease. She is going to proceed with biopsy of the enhancement in the breast seen on MRI. Dr. Pamelia Hoit is ordering Oncotype Dx score to determine a role for systemic therapy in the neoadjuvant setting.She is aware if she has breast conserving surgery Dr. Mitzi Hansen would recommend radiotherapy to the breast, and if she had node positive disease despite lumpectomy or  mastectomy in that case radiotherapy in the adjuvant setting would also be recommended. We discussed the risks, benefits, short, and long term effects of radiotherapy, as well as the curative intent. Dr. Mitzi Hansen discusses the delivery and logistics of radiotherapy and anticipates a course of 4 or up to 6 1/2 weeks of radiotherapy. We will see her back a few weeks after surgery  to discuss next steps.   In a visit lasting 60 minutes, greater than 50% of the time was spent face to face reviewing her case, as well as in preparation of, discussing, and coordinating the patient's care.  The above documentation reflects my direct findings during this shared patient visit. Please see the separate note by Dr. Mitzi Hansen on this date for  the remainder of the patient's plan of care.    Osker Mason, [facility]    **Disclaimer: This note was dictated with voice recognition software. Similar sounding words can inadvertently be transcribed and this note may contain transcription errors which may not have been corrected upon publication of note.**

## 2023-06-12 ENCOUNTER — Encounter: Payer: Self-pay | Admitting: *Deleted

## 2023-06-12 ENCOUNTER — Telehealth: Payer: Self-pay | Admitting: *Deleted

## 2023-06-12 NOTE — Telephone Encounter (Signed)
Left message for a return phone call to follow up from new patient appt and assess navigation needs.   

## 2023-06-13 ENCOUNTER — Encounter: Payer: Self-pay | Admitting: *Deleted

## 2023-06-13 NOTE — Telephone Encounter (Signed)
Called patient to schedule f/u visit to review scans. Left voice mail of future appointment for 2/5 @1045 

## 2023-06-16 ENCOUNTER — Telehealth: Payer: Self-pay | Admitting: Licensed Clinical Social Worker

## 2023-06-16 NOTE — Telephone Encounter (Signed)
[facility] Clinical Social Work  Clinical Social Work was referred by new patient protocol for assessment of psychosocial needs.  Clinical Social Worker attempted to contact patient by phone to offer support and assess for needs.   No answer. Left VM with direct contact information and brief description of support services.     Leata Dominy E Malahki Gasaway, LCSW  Clinical Social Worker [facility]

## 2023-06-17 ENCOUNTER — Telehealth: Payer: Self-pay | Admitting: *Deleted

## 2023-06-17 ENCOUNTER — Ambulatory Visit (HOSPITAL_COMMUNITY)
Admission: RE | Admit: 2023-06-17 | Discharge: 2023-06-17 | Disposition: A | Discharge status: Home / Self Care | Payer: Managed Care, Other (non HMO) | Source: Ambulatory Visit | Attending: Hematology and Oncology | Admitting: Hematology and Oncology

## 2023-06-17 DIAGNOSIS — Z170 Estrogen receptor positive status [ER+]: Secondary | ICD-10-CM | POA: Insufficient documentation

## 2023-06-17 DIAGNOSIS — C50812 Malignant neoplasm of overlapping sites of left female breast: Secondary | ICD-10-CM | POA: Insufficient documentation

## 2023-06-17 MED ORDER — IOHEXOL 300 MG/ML  SOLN
100.0000 mL | Freq: Once | INTRAMUSCULAR | Status: AC | PRN
  Administered 2023-06-17: 100 mL via INTRAVENOUS

## 2023-06-17 NOTE — Telephone Encounter (Signed)
Received VM from patient. Returned patient call but had leave VM with contact info.

## 2023-06-18 ENCOUNTER — Ambulatory Visit
Admission: RE | Admit: 2023-06-18 | Discharge: 2023-06-18 | Disposition: A | Discharge status: Home / Self Care | Payer: Managed Care, Other (non HMO) | Source: Ambulatory Visit | Attending: Hematology and Oncology

## 2023-06-18 ENCOUNTER — Encounter: Payer: Self-pay | Admitting: *Deleted

## 2023-06-18 ENCOUNTER — Ambulatory Visit
Admission: RE | Admit: 2023-06-18 | Discharge: 2023-06-18 | Disposition: A | Discharge status: Home / Self Care | Payer: Managed Care, Other (non HMO) | Source: Ambulatory Visit | Attending: Hematology and Oncology | Admitting: Hematology and Oncology

## 2023-06-18 DIAGNOSIS — R928 Other abnormal and inconclusive findings on diagnostic imaging of breast: Secondary | ICD-10-CM

## 2023-06-18 DIAGNOSIS — Z170 Estrogen receptor positive status [ER+]: Secondary | ICD-10-CM

## 2023-06-18 MED ORDER — GADOPICLENOL 0.5 MMOL/ML IV SOLN
8.0000 mL | Freq: Once | INTRAVENOUS | Status: AC | PRN
Start: 2023-06-18 — End: 2023-06-18
  Administered 2023-06-18: 8 mL via INTRAVENOUS

## 2023-06-19 LAB — SURGICAL PATHOLOGY

## 2023-06-23 ENCOUNTER — Telehealth: Payer: Self-pay | Admitting: *Deleted

## 2023-06-23 ENCOUNTER — Encounter (HOSPITAL_COMMUNITY)
Admission: RE | Admit: 2023-06-23 | Discharge: 2023-06-23 | Disposition: A | Discharge status: Home / Self Care | Payer: Managed Care, Other (non HMO) | Source: Ambulatory Visit | Attending: Hematology and Oncology | Admitting: Hematology and Oncology

## 2023-06-23 ENCOUNTER — Encounter: Payer: Self-pay | Admitting: *Deleted

## 2023-06-23 DIAGNOSIS — Z170 Estrogen receptor positive status [ER+]: Secondary | ICD-10-CM

## 2023-06-23 DIAGNOSIS — C50812 Malignant neoplasm of overlapping sites of left female breast: Secondary | ICD-10-CM | POA: Diagnosis present

## 2023-06-23 MED ORDER — TECHNETIUM TC 99M MEDRONATE IV KIT
20.0000 | PACK | Freq: Once | INTRAVENOUS | Status: AC | PRN
  Administered 2023-06-23: 19.6 via INTRAVENOUS

## 2023-06-23 NOTE — Telephone Encounter (Signed)
Received order for oncotype testing on CORE bx. Requisition sent to [facility]

## 2023-06-23 NOTE — Assessment & Plan Note (Signed)
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   Pathology and radiology counseling: Discussed with the patient, the details of pathology including the type of breast cancer,the clinical staging, the significance of ER, PR and HER-2/neu receptors and the implications for treatment. After reviewing the pathology in detail, we proceeded to discuss the different treatment options between surgery, radiation, chemotherapy, antiestrogen therapies.   Treatment plan: Perform biopsy on the 5 cm of enhancement: Grade 2 IDC, ER 75%, PR 75%, Ki 67: 20%, HER 2 1+ Neg Obtain CT scans and bone scan: 06/18/23:Left breast mass 2.2 cm, prom axillary LN, no distant mets, non specific pancreatic head/ uncinate mass Oncotype DX on the biopsy to determine if neoadjuvant chemotherapy should be considered. Mastectomy versus lumpectomy (patient wishes to do bilateral breast reductions if possible) Adjuvant radiation Adjuvant antiestrogen therapy   I recommended starting the patient now on antiestrogen therapy in anticipation of some delay with surgical planning. I would like to see her back after the scans are available and after the breast MRI guided biopsy is available.

## 2023-06-25 ENCOUNTER — Inpatient Hospital Stay: Payer: Managed Care, Other (non HMO) | Attending: Hematology and Oncology | Admitting: Hematology and Oncology

## 2023-06-25 ENCOUNTER — Encounter: Payer: Self-pay | Admitting: *Deleted

## 2023-06-25 VITALS — BP 129/54 | HR 90 | Temp 98.1°F | Resp 20 | Ht 64.25 in | Wt 190.5 lb

## 2023-06-25 DIAGNOSIS — Z923 Personal history of irradiation: Secondary | ICD-10-CM | POA: Insufficient documentation

## 2023-06-25 DIAGNOSIS — Z803 Family history of malignant neoplasm of breast: Secondary | ICD-10-CM | POA: Diagnosis not present

## 2023-06-25 DIAGNOSIS — C50812 Malignant neoplasm of overlapping sites of left female breast: Secondary | ICD-10-CM | POA: Insufficient documentation

## 2023-06-25 DIAGNOSIS — Z170 Estrogen receptor positive status [ER+]: Secondary | ICD-10-CM | POA: Diagnosis not present

## 2023-06-25 DIAGNOSIS — Z801 Family history of malignant neoplasm of trachea, bronchus and lung: Secondary | ICD-10-CM | POA: Insufficient documentation

## 2023-06-25 DIAGNOSIS — Z79811 Long term (current) use of aromatase inhibitors: Secondary | ICD-10-CM | POA: Insufficient documentation

## 2023-06-25 DIAGNOSIS — Z808 Family history of malignant neoplasm of other organs or systems: Secondary | ICD-10-CM | POA: Insufficient documentation

## 2023-06-25 NOTE — Progress Notes (Signed)
 Patient Care Team: Avelina Greig BRAVO, MD as PCP - General Tyree Nanetta SAILOR, RN as Oncology Nurse Navigator Glean Stephane BROCKS, RN as Oncology Nurse Navigator Odean Potts, MD as Consulting Physician (Hematology and Oncology) Vanderbilt Ned, MD as Consulting Physician (General Surgery) Dewey Rush, MD as Consulting Physician (Radiation Oncology)  DIAGNOSIS:  Encounter Diagnosis  Name Primary?   Malignant neoplasm of overlapping sites of left breast in female, estrogen receptor positive (HCC) Yes    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 (HCC)   06/10/2023 Cancer Staging   Staging form: Breast, AJCC 8th Edition - Clinical: cT4, cN0, cM0, GX, ER+, PR+, HER2- - Signed by Odean Potts, MD on 06/10/2023 Stage prefix: Initial diagnosis Histologic grading system: 3 grade system     CHIEF COMPLIANT: Follow-up to discuss results of scans  HISTORY OF PRESENT ILLNESS:  History of Present Illness   Kelsey Carlson is a 58 year old female with breast cancer who presents for follow-up on recent diagnostic studies and treatment planning.  She has been diagnosed with invasive breast cancer located near the skin surface, particularly under the nipple, affecting multiple areas in the same quadrant without forming a continuous tumor. She recalls a previous incident where a lump was removed that she was unaware of. There is a concern about a spot on the side, but imaging did not show anything significant in that area.  Recent diagnostic studies include a bone scan that was clear and a CT scan that showed a small shadow on the pancreas, considered nonspecific and possibly due to fatty change. A biopsy confirmed breast cancer with some precancerous changes, necessitating surgical intervention.  There was a delay in obtaining the  medication due to pharmacy supply issues, but she began treatment upon returning from a trip to [place] .  Her family history is significant for cancer. Her father passed away from lung cancer that spread to the brain, and her aunt has a similar brain tumor. Her mother had breast cancer, and she underwent BRCA testing several years ago.  Socially, she recently traveled to [place]  for her father's memorial. She is currently managing her work schedule to accommodate her treatment needs, utilizing vacation and unpaid leave as necessary.         ALLERGIES:  has no allergies on file.  MEDICATIONS:  Current Outpatient Medications  Medication Sig Dispense Refill   anastrozole  (ARIMIDEX ) 1 MG tablet Take 1 tablet (1 mg total) by mouth daily. (Patient not taking: Reported on 06/11/2023) 90 tablet 3   gabapentin  (NEURONTIN ) 300 MG capsule Take 300 mg by mouth daily as needed. (Patient not taking: Reported on 06/11/2023)     naproxen  (NAPROSYN ) 500 MG tablet Take 500 mg by mouth 2 (two) times daily as needed. (Patient not taking: Reported on 06/11/2023)     No current facility-administered medications for this visit.    PHYSICAL EXAMINATION: ECOG PERFORMANCE STATUS: 1 - Symptomatic but completely ambulatory  Vitals:   06/25/23 1040  BP: (!) 129/54  Pulse: 90  Resp: 20  Temp: 98.1 F (36.7 C)  SpO2: 100%   Filed Weights   06/25/23 1040  Weight: 190 lb 8 oz (86.4 kg)    Physical Exam          (exam performed in the presence of a chaperone)  LABORATORY DATA:  I have reviewed the  data as listed    Latest Ref Rng & Units 12/17/2022    7:44 AM 10/11/2021    4:56 PM 06/08/2020    8:12 AM  CMP  Glucose 70 - 99 mg/dL 90  99  90   BUN 6 - 23 mg/dL 12  13  12    Creatinine 0.40 - 1.20 mg/dL 9.14  9.11  9.08   Sodium 135 - 145 mEq/L 140  143  138   Potassium 3.5 - 5.1 mEq/L 3.7  4.3  4.2   Chloride 96 - 112 mEq/L 106  106  105   CO2 19 - 32 mEq/L 26  27  28    Calcium 8.4 - 10.5 mg/dL  9.4  9.6  9.3   Total Protein 6.0 - 8.3 g/dL 7.0  6.7  7.1   Total Bilirubin 0.2 - 1.2 mg/dL 0.4  0.4  0.6   Alkaline Phos 39 - 117 U/L 81  89  78   AST 0 - 37 U/L 22  20  19    ALT 0 - 35 U/L 27  29  24      Lab Results  Component Value Date   WBC 7.3 08/19/2017   HGB 14.6 08/19/2017   HCT 43.3 08/19/2017   MCV 92.3 08/19/2017   PLT 361 08/19/2017   NEUTROABS 6.9 07/03/2017    ASSESSMENT & PLAN:  Malignant neoplasm of overlapping sites of left female breast (HCC) 05/23/2023: Left nipple punch biopsy: Infiltrating carcinoma consistent with breast origin (CK7 GATA3 and ER positive) ER 30%, PR 20%, HER2 1+ (send T4, N0, M0, stage IIIb) 06/02/2023: Breast MRI: 2.8 cm biopsy-proven malignancy left nipple retroareolar left breast, 5 cm enhancement UOQ left breast posterior superior to the biopsy-proven malignancy, highly suspicious left breast enhancement spanning 9.8 cm. 06/18/2023: Left breast biopsy 2:30 position: Grade 2 IDC with DCIS ER 75%, PR 75%, Ki67 20%, HER2 1+ negative   Pathology and radiology counseling: Discussed with the patient, the details of pathology including the type of breast cancer,the clinical staging, the significance of ER, PR and HER-2/neu receptors and the implications for treatment. After reviewing the pathology in detail, we proceeded to discuss the different treatment options between surgery, radiation, chemotherapy, antiestrogen therapies.   Treatment plan: Biopsy on the 5 cm of enhancement: Grade 2 IDC, ER 75%, PR 75%, Ki 67: 20%, HER 2 1+ Neg CT scans and bone scan: 06/18/23:Left breast mass 2.2 cm, prom axillary LN, no distant mets, non specific pancreatic head/ uncinate mass Oncotype DX on the biopsy to determine if neoadjuvant chemotherapy should be considered: Pending Mastectomy versus lumpectomy (patient wishes to do bilateral breast reductions if possible): Will be determined by Dr. Vanderbilt Adjuvant radiation Adjuvant antiestrogen therapy   Anastrozole   toxicities: None  I recommend genetic counseling   No orders of the defined types were placed in this encounter.  The patient has a good understanding of the overall plan. she agrees with it. she will call with any problems that may develop before the next visit here. Total time spent: 30 mins including face to face time and time spent for planning, charting and co-ordination of care   Viinay K Adonte Vanriper, MD 06/25/23

## 2023-06-26 ENCOUNTER — Telehealth: Payer: Self-pay | Admitting: Hematology and Oncology

## 2023-06-26 ENCOUNTER — Encounter: Payer: Self-pay | Admitting: Diagnostic Radiology

## 2023-06-26 NOTE — Telephone Encounter (Signed)
 Scheduled appointments per 2/4 los. Patient is aware of the made appointments.

## 2023-06-27 ENCOUNTER — Other Ambulatory Visit (HOSPITAL_COMMUNITY): Payer: Managed Care, Other (non HMO)

## 2023-07-03 ENCOUNTER — Encounter: Payer: Self-pay | Admitting: *Deleted

## 2023-07-03 ENCOUNTER — Telehealth: Payer: Self-pay | Admitting: *Deleted

## 2023-07-03 NOTE — Telephone Encounter (Signed)
Received oncotype results of 30/19%. Spoke with patient and she is aware. Appt confirmed with Dr. Pamelia Hoit for 2/17 at 8:45am to discuss.

## 2023-07-07 ENCOUNTER — Other Ambulatory Visit: Payer: Self-pay | Admitting: *Deleted

## 2023-07-07 ENCOUNTER — Inpatient Hospital Stay (HOSPITAL_BASED_OUTPATIENT_CLINIC_OR_DEPARTMENT_OTHER): Payer: Managed Care, Other (non HMO) | Admitting: Hematology and Oncology

## 2023-07-07 ENCOUNTER — Telehealth: Payer: Self-pay | Admitting: Hematology and Oncology

## 2023-07-07 VITALS — BP 141/66 | HR 72 | Temp 97.7°F | Resp 18 | Ht 64.25 in | Wt 189.6 lb

## 2023-07-07 DIAGNOSIS — Z170 Estrogen receptor positive status [ER+]: Secondary | ICD-10-CM

## 2023-07-07 DIAGNOSIS — C50812 Malignant neoplasm of overlapping sites of left female breast: Secondary | ICD-10-CM

## 2023-07-07 DIAGNOSIS — Z5111 Encounter for antineoplastic chemotherapy: Secondary | ICD-10-CM

## 2023-07-07 MED ORDER — DEXAMETHASONE 4 MG PO TABS: ORAL_TABLET | ORAL | 0 refills | Status: DC

## 2023-07-07 MED ORDER — LIDOCAINE-PRILOCAINE 2.5-2.5 % EX CREA: TOPICAL_CREAM | CUTANEOUS | 3 refills | Status: DC

## 2023-07-07 MED ORDER — ONDANSETRON HCL 8 MG PO TABS: ORAL_TABLET | ORAL | 1 refills | Status: DC

## 2023-07-07 MED ORDER — PROCHLORPERAZINE MALEATE 10 MG PO TABS: 10.0000 mg | ORAL_TABLET | Freq: Four times a day (QID) | ORAL | 1 refills | Status: DC | PRN

## 2023-07-07 NOTE — Progress Notes (Signed)
 Patient Care Team: Excell Seltzer, MD as PCP - General Donnelly Angelica, RN as Oncology Nurse Navigator Pershing Proud, RN as Oncology Nurse Navigator Serena Croissant, MD as Consulting Physician (Hematology and Oncology) Harriette Bouillon, MD as Consulting Physician (General Surgery) Dorothy Puffer, MD as Consulting Physician (Radiation Oncology)  DIAGNOSIS:  Encounter Diagnosis  Name Primary?   Malignant neoplasm of overlapping sites of left breast in female, estrogen receptor positive (HCC) Yes    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 (HCC)   06/10/2023 Cancer Staging   Staging form: Breast, AJCC 8th Edition - Clinical: cT4, cN0, cM0, GX, ER+, PR+, HER2- - Signed by Serena Croissant, MD on 06/10/2023 Stage prefix: Initial diagnosis Histologic grading system: 3 grade system     CHIEF COMPLIANT: Follow-up to discuss results of Oncotype DX and treatment plan  HISTORY OF PRESENT ILLNESS: Kelsey Carlson is a 58 year old with above-mentioned history of breast cancer which was diagnosed by skin punch biopsy.  Imaging studies did not show any distant metastatic disease but there were some axillary lymph nodes.  We did Oncotype DX testing and it came back as high risk and she is here today accompanied by her husband to discuss her treatment plan.  In the interim she was on anastrozole therapy and had tolerated it extremely well.       ALLERGIES:  has no allergies on file.  MEDICATIONS:  Current Outpatient Medications  Medication Sig Dispense Refill   anastrozole (ARIMIDEX) 1 MG tablet Take 1 tablet (1 mg total) by mouth daily. (Patient not taking: Reported on 06/11/2023) 90 tablet 3   gabapentin (NEURONTIN) 300 MG capsule Take 300 mg by mouth daily as needed. (Patient not taking: Reported on 06/11/2023)     naproxen (NAPROSYN) 500  MG tablet Take 500 mg by mouth 2 (two) times daily as needed. (Patient not taking: Reported on 06/11/2023)     No current facility-administered medications for this visit.    PHYSICAL EXAMINATION: ECOG PERFORMANCE STATUS: 1 - Symptomatic but completely ambulatory  Vitals:   07/07/23 0854  BP: (!) 141/66  Pulse: 72  Resp: 18  Temp: 97.7 F (36.5 C)  SpO2: 100%   Filed Weights   07/07/23 0854  Weight: 189 lb 9.6 oz (86 kg)      LABORATORY DATA:  I have reviewed the data as listed    Latest Ref Rng & Units 12/17/2022    7:44 AM 10/11/2021    4:56 PM 06/08/2020    8:12 AM  CMP  Glucose 70 - 99 mg/dL 90  99  90   BUN 6 - 23 mg/dL 12  13  12    Creatinine 0.40 - 1.20 mg/dL 0.98  1.19  1.47   Sodium 135 - 145 mEq/L 140  143  138   Potassium 3.5 - 5.1 mEq/L 3.7  4.3  4.2   Chloride 96 - 112 mEq/L 106  106  105   CO2 19 - 32 mEq/L 26  27  28    Calcium 8.4 - 10.5 mg/dL 9.4  9.6  9.3   Total Protein 6.0 - 8.3 g/dL 7.0  6.7  7.1   Total Bilirubin 0.2 - 1.2 mg/dL 0.4  0.4  0.6   Alkaline Phos 39 - 117 U/L 81  89  78   AST 0 - 37 U/L 22  20  19   ALT 0 - 35 U/L 27  29  24      Lab Results  Component Value Date   WBC 7.3 08/19/2017   HGB 14.6 08/19/2017   HCT 43.3 08/19/2017   MCV 92.3 08/19/2017   PLT 361 08/19/2017   NEUTROABS 6.9 07/03/2017    ASSESSMENT & PLAN:  Malignant neoplasm of overlapping sites of left female breast (HCC) 05/23/2023: Left nipple punch biopsy: Infiltrating carcinoma consistent with breast origin (CK7 GATA3 and ER positive) ER 30%, PR 20%, HER2 1+ (send T4, N0, M0, stage IIIb) 06/02/2023: Breast MRI: 2.8 cm biopsy-proven malignancy left nipple retroareolar left breast, 5 cm enhancement UOQ left breast posterior superior to the biopsy-proven malignancy, highly suspicious left breast enhancement spanning 9.8 cm. 06/18/2023: Left breast biopsy 2:30 position: Grade 2 IDC with DCIS ER 75%, PR 75%, Ki67 20%, HER2 1+ negative   Treatment plan: Biopsy on the 5  cm of enhancement: Grade 2 IDC, ER 75%, PR 75%, Ki 67: 20%, HER 2 1+ Neg CT scans and bone scan: 06/18/23:Left breast mass 2.2 cm, prom axillary LN, no distant mets, non specific pancreatic head/ uncinate mass 07/02/2023: Oncotype DX: 30 (distant recurrence at 9 years: 19%) Recommend neoadjuvant chemotherapy with dose dense Adriamycin and Cytoxan x 4 followed by Taxol x 12 Mastectomy versus lumpectomy (patient wishes to do bilateral breast reductions if possible): Will be determined by Dr. Luisa Hart Adjuvant radiation Adjuvant antiestrogen therapy   Chemotherapy Counseling: I discussed the risks and benefits of chemotherapy including the risks of nausea/ vomiting, risk of infection from low WBC count, fatigue due to chemo or anemia, bruising or bleeding due to low platelets, mouth sores, loss/ change in taste and decreased appetite. Liver and kidney function will be monitored through out chemotherapy as abnormalities in liver and kidney function may be a side effect of treatment. Cardiac dysfunction due to Adriamycin and neuropathy risk from Taxol were discussed in detail. Risk of permanent bone marrow dysfunction and leukemia due to chemo were also discussed.  Anastrozole toxicities: None  Patient will stop anastrozole when she starts chemotherapy. Plan: Echo, chemo class, ice compress study     No orders of the defined types were placed in this encounter.  The patient has a good understanding of the overall plan. she agrees with it. she will call with any problems that may develop before the next visit here. Total time spent: 45 mins including face to face time and time spent for planning, charting and co-ordination of care   Kelsey Meek, MD 07/07/23

## 2023-07-07 NOTE — Research (Signed)
 Trial:  S2205, ICE COMPRESS: RANDOMIZED TRIAL OF LIMB CRYOCOMPRESSION VERSUS CONTINUOUS COMPRESSION VERSUS LOW CYCLIC COMPRESSION FOR THE PREVENTION  OF TAXANE-INDUCED PERIPHERAL NEUROPATHY Patient Kelsey Carlson was identified by Dr Pamelia Hoit as a potential candidate for the above listed study.  This Clinical Research Nurse met with Galen Daft, [id number], on 07/07/23 in a manner and location that ensures patient privacy to discuss participation in the above listed research study.  Patient is Accompanied by her husband .  A copy of the informed consent document and separate HIPAA Authorization was provided to the patient.  Patient reads, speaks, and understands [place].   Patient was provided with the business card of this Nurse and encouraged to contact the research team with any questions.  Approximately 15 minutes were spent with the patient reviewing the informed consent documents.  Patient was provided the option of taking informed consent documents home to review and was encouraged to review at their convenience with their support network, including other care providers. Patient took the consent documents home to review. Patient will have AC prior to Taxol. Plan made for research to follow up in a month to discuss study further at that time. Margret Chance Moesha Sarchet, RN, BSN, [facility] She  Her  Hers Clinical Research Nurse [facility] Direct Dial [phone] 07/07/2023 10:03 AM

## 2023-07-07 NOTE — Telephone Encounter (Signed)
 Patient is aware of scheduled appointment times/dates for patient education class

## 2023-07-07 NOTE — Assessment & Plan Note (Addendum)
 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 Luisa Hart Adjuvant radiation Adjuvant antiestrogen therapy   Chemotherapy Counseling: I discussed the risks and benefits of chemotherapy including the risks of nausea/ vomiting, risk of infection from low WBC count, fatigue due to chemo or anemia, bruising or bleeding due to low platelets, mouth sores, loss/ change in taste and decreased appetite. Liver and kidney function will be monitored through out chemotherapy as abnormalities in liver and kidney function may be a side effect of treatment. Cardiac dysfunction due to Adriamycin and neuropathy risk from Taxol were discussed in detail. Risk of permanent bone marrow dysfunction and leukemia due to chemo were also discussed.  Anastrozole toxicities: None  Patient will stop anastrozole when she starts chemotherapy. Plan: Echo, chemo class, ice compress study

## 2023-07-07 NOTE — Progress Notes (Signed)
 START ON PATHWAY REGIMEN - Breast     Cycles 1 through 4: A cycle is every 14 days:     Doxorubicin      Cyclophosphamide      [id number]    Cycles 5 through 16: A cycle is every 7 days:     Paclitaxel   **Always confirm dose/schedule in your pharmacy ordering system**  Patient Characteristics: Preoperative or Nonsurgical Candidate, M0 (Clinical Staging), Up to cT4c, Any N, M0, Neoadjuvant Therapy followed by Surgery, Invasive Disease, Chemotherapy, HER2 Negative, ER Positive Therapeutic Status: Preoperative or Nonsurgical Candidate, M0 (Clinical Staging) AJCC M Category: cM0 AJCC Grade: G2 ER Status: Positive (+) AJCC 8 Stage Grouping: IIIB HER2 Status: Negative (-) AJCC T Category: cT4a AJCC N Category: cN0 PR Status: Positive (+) Breast Surgical Plan: Neoadjuvant Therapy followed by Surgery Intent of Therapy: Curative Intent, Discussed with Patient

## 2023-07-08 ENCOUNTER — Telehealth: Payer: Self-pay | Admitting: Hematology and Oncology

## 2023-07-08 ENCOUNTER — Other Ambulatory Visit: Payer: Self-pay

## 2023-07-08 NOTE — Telephone Encounter (Signed)
 Rescheduled appointments due to pending inclement weather. Patient is aware of the changes made to her upcoming appointments.

## 2023-07-09 ENCOUNTER — Encounter: Payer: Self-pay | Admitting: *Deleted

## 2023-07-09 ENCOUNTER — Ambulatory Visit (INDEPENDENT_AMBULATORY_CARE_PROVIDER_SITE_OTHER): Payer: Managed Care, Other (non HMO)

## 2023-07-09 ENCOUNTER — Inpatient Hospital Stay: Payer: Managed Care, Other (non HMO)

## 2023-07-09 ENCOUNTER — Other Ambulatory Visit: Payer: Self-pay | Admitting: Hematology and Oncology

## 2023-07-09 ENCOUNTER — Other Ambulatory Visit (HOSPITAL_BASED_OUTPATIENT_CLINIC_OR_DEPARTMENT_OTHER): Payer: Managed Care, Other (non HMO)

## 2023-07-09 ENCOUNTER — Inpatient Hospital Stay: Payer: Managed Care, Other (non HMO) | Admitting: Pharmacist

## 2023-07-09 DIAGNOSIS — C50812 Malignant neoplasm of overlapping sites of left female breast: Secondary | ICD-10-CM | POA: Diagnosis not present

## 2023-07-09 DIAGNOSIS — Z0189 Encounter for other specified special examinations: Secondary | ICD-10-CM

## 2023-07-09 DIAGNOSIS — Z5111 Encounter for antineoplastic chemotherapy: Secondary | ICD-10-CM | POA: Diagnosis not present

## 2023-07-09 DIAGNOSIS — Z170 Estrogen receptor positive status [ER+]: Secondary | ICD-10-CM

## 2023-07-09 LAB — ECHOCARDIOGRAM COMPLETE
Area-P 1/2: 3.42 cm2
S' Lateral: 2.96 cm

## 2023-07-10 ENCOUNTER — Encounter: Payer: Self-pay | Admitting: *Deleted

## 2023-07-14 ENCOUNTER — Other Ambulatory Visit: Payer: Self-pay | Admitting: Radiology

## 2023-07-14 NOTE — Progress Notes (Signed)
 Pharmacist Chemotherapy Monitoring - Initial Assessment    Anticipated start date: 07/21/23   The following has been reviewed per standard work regarding the patient's treatment regimen: The patient's diagnosis, treatment plan and drug doses, and organ/hematologic function Lab orders and baseline tests specific to treatment regimen  The treatment plan start date, drug sequencing, and pre-medications Prior authorization status  Patient's documented medication list, including drug-drug interaction screen and prescriptions for anti-emetics and supportive care specific to the treatment regimen The drug concentrations, fluid compatibility, administration routes, and timing of the medications to be used The patient's access for treatment and lifetime cumulative dose history, if applicable  The patient's medication allergies and previous infusion related reactions, if applicable   Changes made to treatment plan:  N/A  Follow up needed:  Pending authorization for treatment  Port scheduled 07/15/23   Janeice Robinson, [facility], 07/14/2023  1:44 PM

## 2023-07-14 NOTE — H&P (Signed)
 Chief Complaint: left breast cancer - referred for image guided port a catheter insertion   Referring Provider(s): Angie Fava   Supervising Physician: Marliss Coots  Patient Status: [facility] - Out-pt  History of Present Illness: Kelsey Carlson is a 58 y.o. female with a history of arthritis, gallstones, and lumbar back pain.  She was recently diagnosed with invasive left breast cancer located near the skin surface, under the nipple. She was diagnosed by skin punch biopsy on 05/23/23 which initiated her following by Oncology with Dr. Pamelia Hoit.  An MRI from 06/02/23 revealed 2.8 cm mass, 5 cm enhancement of UOQ left breast, enhancement spanning 9.8 cm. 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 She is planning to begin chemotherapy and was referred to interventional radiology for image guided port a catheter insertion.     Patient is Full Code  Past Medical History:  Diagnosis Date   Arthritis    Gallstones    History of kidney stones    Lumbar back pain 06/28/2013   Nephrolithiasis    Urolithiasis    Wears glasses     Past Surgical History:  Procedure Laterality Date   ANKLE ARTHRODESIS  1981   left ankle bone tumor-   BLADDER SUSPENSION  2008   sling-cysto   breast biopsy  2002   lt br bx   BREAST CYST EXCISION Left 02/19/2013   Procedure:  EXCISION BREAST MASS;  Surgeon: Maisie Fus A. Cornett, MD;  Location: [facility];  Service: General;  Laterality: Left;   BREAST EXCISIONAL BIOPSY Left 2014   benign   BREAST SURGERY     CHOLECYSTECTOMY N/A 08/27/2017   Procedure: LAPAROSCOPIC CHOLECYSTECTOMY WITH INTRAOPERATIVE CHOLANGIOGRAM;  Surgeon: Griselda Miner, MD;  Location: [facility] OR;  Service: General;  Laterality: N/A;   DILATION AND CURETTAGE OF UTERUS     LUMBAR DISC SURGERY  1997   lumb lam   TUBAL LIGATION     Essure    Allergies: Patient has no allergy information on record.  Medications: Prior to Admission medications   Medication  Sig Start Date End Date Taking? Authorizing Provider  anastrozole (ARIMIDEX) 1 MG tablet Take 1 tablet (1 mg total) by mouth daily. Patient not taking: Reported on 06/11/2023 06/10/23   Serena Croissant, MD  dexamethasone (DECADRON) 4 MG tablet Take 1 tablet day after chemo and 1 tablet 2 days after chemo with food 07/07/23   Serena Croissant, MD  gabapentin (NEURONTIN) 300 MG capsule Take 300 mg by mouth daily as needed. Patient not taking: Reported on 06/11/2023    [provider]  lidocaine-prilocaine (EMLA) cream Apply to affected area once 07/07/23   Serena Croissant, MD  naproxen (NAPROSYN) 500 MG tablet Take 500 mg by mouth 2 (two) times daily as needed. Patient not taking: Reported on 06/11/2023    [provider]  ondansetron (ZOFRAN) 8 MG tablet Take 1 tab (8 mg) by mouth every 8 hrs as needed for nausea/vomiting. Start third day after doxorubicin/cyclophosphamide chemotherapy. 07/07/23   Serena Croissant, MD  prochlorperazine (COMPAZINE) 10 MG tablet Take 1 tablet (10 mg total) by mouth every 6 (six) hours as needed for nausea or vomiting. 07/07/23   Serena Croissant, MD     Family History  Problem Relation Age of Onset   Breast cancer Mother    Hypertension Father    Prostate cancer Father     Social History   Socioeconomic History   Marital status: Married  Spouse name: Not on file   Number of children: Not on file   Years of education: Not on file   Highest education level: Not on file  Occupational History    Employer: [employer]    Comment: Mailroom  Tobacco Use   Smoking status: Former    Current packs/day: 0.00    Types: Cigarettes    Quit date: 05/20/1994    Years since quitting: 29.1   Smokeless tobacco: Never  Vaping Use   Vaping status: Every Day  Substance and Sexual Activity   Alcohol use: Yes    Comment: socially   Drug use: No   Sexual activity: Yes    Birth control/protection: Surgical    Comment: Essure BTL  Other Topics Concern   Not on  file  Social History Narrative   Regular exercise- yes 3 x a week      Diet- fruits and veggies   Social Drivers of Health   Financial Resource Strain: Not on file  Food Insecurity: No Food Insecurity (06/11/2023)   Hunger Vital Sign    Worried About Running Out of Food in the Last Year: Never true    Ran Out of Food in the Last Year: Never true  Transportation Needs: No Transportation Needs (06/11/2023)   PRAPARE - Administrator, Civil Service (Medical): No    Lack of Transportation (Non-Medical): No  Physical Activity: Not on file  Stress: Not on file  Social Connections: Not on file       Review of Systems denies fever, CP,dyspnea, abd pain,back pain,N,V or bleeding; she does have occ HA, cough  Vital Signs: Vitals:   07/15/23 1241  BP: (!) 147/85  Pulse: 84  Resp: 15  Temp: 97.6 F (36.4 C)  SpO2: 97%    LMP 03/03/2021   Advance Care Plan: no documents on file  Physical Exam: awake/alert; chest- CTA bilat; heart- RRR; abd-soft,+BS,NT; no LE edema  Imaging: ECHOCARDIOGRAM COMPLETE Result Date: 07/09/2023    ECHOCARDIOGRAM REPORT   Patient Name:   Kelsey Carlson Helling Date of Exam: 07/09/2023 Medical Rec #:  [id number]             Height:       64.3 in Accession #:    [id number]            Weight:       189.6 lb Date of Birth:  1965/10/26              BSA:          1.918 m Patient Age:    57 years              BP:           120/78 mmHg Patient Gender: F                     HR:           98 bpm. Exam Location:  Outpatient Procedure: 2D Echo, 3D Echo, Cardiac Doppler, Color Doppler and Strain Analysis            (Both Spectral and Color Flow Doppler were utilized during            procedure). Indications:    Chemo  History:        Patient has no prior history of Echocardiogram examinations.                 Signs/Symptoms:Chest Pain; Risk  Factors:Former Smoker. Malignant                 neoplasm of overlapping sites of left female breast.  Sonographer:    Jeryl Columbia RDCS Referring Phys: [phone] Serena Croissant IMPRESSIONS  1. Left ventricular ejection fraction, by estimation, is 60 to 65%. Left ventricular ejection fraction by 3D volume is 62 %. The left ventricle has normal function. The left ventricle has no regional wall motion abnormalities. Left ventricular diastolic  parameters were normal. The average left ventricular global longitudinal strain is -14.6 %. The global longitudinal strain is abnormal.  2. Right ventricular systolic function is normal. The right ventricular size is moderately enlarged.  3. Right atrial size was mildly dilated.  4. The mitral valve is normal in structure. No evidence of mitral valve regurgitation. No evidence of mitral stenosis.  5. The aortic valve is tricuspid. Aortic valve regurgitation is not visualized. No aortic stenosis is present.  6. The inferior vena cava is normal in size with greater than 50% respiratory variability, suggesting right atrial pressure of 3 mmHg. Comparison(s): No prior Echocardiogram. FINDINGS  Left Ventricle: Left ventricular ejection fraction, by estimation, is 60 to 65%. Left ventricular ejection fraction by 3D volume is 62 %. The left ventricle has normal function. The left ventricle has no regional wall motion abnormalities. The average left ventricular global longitudinal strain is -14.6 %. Strain was performed and the global longitudinal strain is abnormal. The left ventricular internal cavity size was normal in size. There is no left ventricular hypertrophy. Left ventricular diastolic parameters were normal. Right Ventricle: The right ventricular size is moderately enlarged. No increase in right ventricular wall thickness. Right ventricular systolic function is normal. Left Atrium: Left atrial size was normal in size. Right Atrium: Right atrial size was mildly dilated. Pericardium: There is no evidence of pericardial effusion. Presence of epicardial fat layer. Mitral Valve: The mitral valve is normal  in structure. No evidence of mitral valve regurgitation. No evidence of mitral valve stenosis. Tricuspid Valve: The tricuspid valve is normal in structure. Tricuspid valve regurgitation is not demonstrated. No evidence of tricuspid stenosis. Aortic Valve: The aortic valve is tricuspid. Aortic valve regurgitation is not visualized. No aortic stenosis is present. Pulmonic Valve: The pulmonic valve was normal in structure. Pulmonic valve regurgitation is not visualized. No evidence of pulmonic stenosis. Aorta: The aortic root and ascending aorta are structurally normal, with no evidence of dilitation. Venous: The inferior vena cava is normal in size with greater than 50% respiratory variability, suggesting right atrial pressure of 3 mmHg. IAS/Shunts: The atrial septum is grossly normal. Additional Comments: 3D was performed not requiring image post processing on an independent workstation and was normal.  LEFT VENTRICLE PLAX 2D LVIDd:         4.48 cm         Diastology LVIDs:         2.96 cm         LV e' medial:    7.62 cm/s LV PW:         1.09 cm         LV E/e' medial:  6.1 LV IVS:        1.00 cm         LV e' lateral:   11.00 cm/s LVOT diam:     2.00 cm         LV E/e' lateral: 4.2 LV SV:         41 LV SV  Index:   21              2D LVOT Area:     3.14 cm        Longitudinal                                Strain                                2D Strain GLS  -15.2 %                                (A2C):                                2D Strain GLS  -13.6 %                                (A3C):                                2D Strain GLS  -15.2 %                                (A4C):                                2D Strain GLS  -14.6 %                                Avg:                                 3D Volume EF                                LV 3D EF:    Left                                             ventricul                                             ar                                             ejection                                              fraction  by 3D                                             volume is                                             62 %.                                 3D Volume EF:                                3D EF:        62 %                                LV EDV:       105 ml                                LV ESV:       40 ml                                LV SV:        65 ml RIGHT VENTRICLE RV Basal diam:  4.47 cm RV Mid diam:    3.57 cm RV S prime:     10.10 cm/s TAPSE (M-mode): 2.6 cm LEFT ATRIUM             Index        RIGHT ATRIUM           Index LA diam:        2.60 cm 1.36 cm/m   RA Area:     19.80 cm LA Vol (A2C):   30.0 ml 15.64 ml/m  RA Volume:   64.40 ml  33.58 ml/m LA Vol (A4C):   49.2 ml 25.66 ml/m LA Biplane Vol: 39.2 ml 20.44 ml/m  AORTIC VALVE LVOT Vmax:   66.20 cm/s LVOT Vmean:  43.700 cm/s LVOT VTI:    0.130 m  AORTA Ao Root diam: 3.00 cm Ao Asc diam:  3.10 cm MITRAL VALVE MV Area (PHT): 3.42 cm    SHUNTS MV Decel Time: 222 msec    Systemic VTI:  0.13 m MV E velocity: 46.70 cm/s  Systemic Diam: 2.00 cm MV A velocity: 50.70 cm/s MV E/A ratio:  0.92 Riley Lam MD Electronically signed by Riley Lam MD Signature Date/Time: 07/09/2023/11:12:34 AM    Final    NM Bone Scan Whole Body Result Date: 06/24/2023 CLINICAL DATA:  Breast cancer EXAM: NUCLEAR MEDICINE WHOLE BODY BONE SCAN TECHNIQUE: Whole body anterior and posterior images were obtained approximately 3 hours after intravenous injection of radiopharmaceutical. RADIOPHARMACEUTICALS:  19.6 mCi Technetium-29m MDP IV COMPARISON:  CT 06/17/2023. No prior bone scans available for comparison. FINDINGS: Physiologic activity within the kidneys and bladder. Mild asymmetrical left hip activity, correlating with CT and felt secondary to advanced degenerative changes. Small foci of degenerative uptake in the  feet and ankles. No suspicious foci of bone  activity visualized IMPRESSION: 1. No evidence for osseous metastatic disease 2. Degenerative uptake at the left hip, feet and ankles. Electronically Signed   By: Jasmine Pang M.D.   On: 06/24/2023 19:46   MR LT BREAST BX W LOC DEV 1ST LESION IMAGE BX SPEC MR GUIDE Addendum Date: 06/19/2023 ADDENDUM REPORT: 06/19/2023 13:40 ADDENDUM: Pathology revealed INVASIVE MAMMARY CARCINOMA, AN IMMUNOHISTOCHEMICAL STAIN FOR E-CADHERIN IS PENDING AND WILL BE REPORTED IN ADDENDUM, MAMMARY CARCINOMA IN SITU, NUCLEAR GRADE 1-2 OF 3 of the LEFT breast, 2:30 o'clock, (barbell clip). This was found to be concordant by Dr. Amie Portland. Pathology results were discussed with the patient by telephone. The patient reported doing well after the biopsy with tenderness at the site. Post biopsy instructions and care were reviewed and questions were answered. The patient was encouraged to call [facility] for any additional concerns. My direct phone number was provided. The patient has a recent diagnosis of LEFT breast cancer and should follow her outlined treatment plan. Dr. Harriette Bouillon & Dr. Serena Croissant were notified of biopsy results via secure EPIC message on June 19, 2023. Pathology results reported by Rene Kocher, RN on 06/19/2023. Electronically Signed   By: Amie Portland M.D.   On: 06/19/2023 13:40   Result Date: 06/19/2023 CLINICAL DATA:  Patient presents for MRI guided core needle biopsy a 5 cm area of abnormal enhancement in the left breast. Patient has a recent diagnosis left breast carcinoma on a punch biopsy of the left areolar complex. EXAM: MRI GUIDED CORE NEEDLE BIOPSY OF THE LEFT BREAST TECHNIQUE: Multiplanar, multisequence MR imaging of the left breast was performed both before and after administration of intravenous contrast. CONTRAST:  8 mL of Vueway. COMPARISON:  Previous exam(s). FINDINGS: I met with the patient, and we discussed the procedure of MRI guided biopsy, including  risks, benefits, and alternatives. Specifically, we discussed the risks of infection, bleeding, tissue injury, clip migration, and inadequate sampling. Informed, written consent was given. The usual time out protocol was performed immediately prior to the procedure. Using sterile technique, 1% Lidocaine, MRI guidance, and a 9 gauge vacuum assisted device, biopsy was performed of the area of abnormal enhancement in the upper outer quadrant, at 2:30 o'clock, using a lateral approach. At the conclusion of the procedure, a barbell-shaped tissue marker clip was deployed into the biopsy cavity. Follow-up 2-view mammogram was performed and dictated separately. IMPRESSION: MRI guided biopsy of the left breast.  No apparent complications. Electronically Signed: By: Amie Portland M.D. On: 06/18/2023 07:57   MM CLIP PLACEMENT LEFT Result Date: 06/18/2023 CLINICAL DATA:  Assess post biopsy marker clip placement following MRI guided core needle biopsy of a left breast lesion. EXAM: 3D DIAGNOSTIC LEFT MAMMOGRAM POST MRI BIOPSY COMPARISON:  Previous exam(s). FINDINGS: 3D Mammographic images were obtained following MRI guided biopsy of a left breast lesion. The biopsy marking clip is in expected position at the site of biopsy. IMPRESSION: Appropriate positioning of the barbell shaped biopsy marking clip at the site of biopsy in the lateral left breast. Final Assessment: Post Procedure Mammograms for Marker Placement Electronically Signed   By: Amie Portland M.D.   On: 06/18/2023 09:50   CT CHEST ABDOMEN PELVIS W CONTRAST Result Date: 06/18/2023 CLINICAL DATA:  Invasive breast cancer, stage IIIB workup. * Tracking Code: BO * EXAM: CT CHEST, ABDOMEN, AND PELVIS WITH CONTRAST TECHNIQUE: Multidetector CT imaging of the chest, abdomen and pelvis was  performed following the standard protocol during bolus administration of intravenous contrast. RADIATION DOSE REDUCTION: This exam was performed according to the departmental  dose-optimization program which includes automated exposure control, adjustment of the mA and/or kV according to patient size and/or use of iterative reconstruction technique. CONTRAST:  OMNIPAQUE IOHEXOL 300 MG/ML  SOLN COMPARISON:  None Available. FINDINGS: CT CHEST FINDINGS Cardiovascular: No significant vascular findings. Normal heart size. No pericardial effusion. Mediastinum/Nodes: No suspicious thyroid nodule. Prominent left axillary lymph nodes measure up to 5 mm in short axis on image 18/2. No pathologically enlarged mediastinal, hilar, subpectoral, axillary or supraclavicular lymph nodes. The esophagus is grossly unremarkable. Lungs/Pleura: No suspicious pulmonary nodules or masses. No pleural effusion. No pneumothorax. Musculoskeletal: Asymmetric masslike breast tissue in the lateral left breast measures 2.2 x 1.2 cm on image 28/2. No aggressive lytic or blastic lesion of bone. CT ABDOMEN PELVIS FINDINGS Hepatobiliary: No suspicious hepatic lesion. Gallbladder surgically absent. No biliary ductal dilation. Pancreas: Ill-defined hypodensity in the pancreatic head/uncinate process on image 67-70/2, no pancreatic ductal dilation or adjacent stranding. No evidence of acute inflammation. Spleen: No splenomegaly or focal splenic lesion. Adrenals/Urinary Tract: Bilateral adrenal glands appear normal. No hydronephrosis. Kidneys demonstrate symmetric enhancement. Nonobstructive 3 mm right interpolar renal stone. Urinary bladder is unremarkable for degree of distension. Stomach/Bowel: No radiopaque enteric contrast material was administered. Stomach is unremarkable for degree of distension. No pathologic dilation of small or large bowel. Normal appendix. No evidence of acute bowel inflammation. Vascular/Lymphatic: Normal caliber abdominal aorta. Smooth IVC contours. The portal, splenic and superior mesenteric veins are patent. No pathologically enlarged abdominal or pelvic lymph nodes. Reproductive: Tubal  occlusion devices in place. Uterus is unremarkable. No suspicious adnexal mass. Other: No significant abdominopelvic free fluid. Musculoskeletal: Multilevel degenerative changes spine. Degenerative change of the bilateral hips. IMPRESSION: 1. Asymmetric masslike breast tissue in the lateral left breast measures 2.2 x 1.2 cm, compatible with known primary breast malignancy. 2. Prominent left axillary lymph nodes measure up to 5 mm in short axis, nonspecific consider attention on dedicated ultrasound. 3. No convincing evidence of metastatic disease in the chest, abdomen or pelvis. 4. Ill-defined hypodensity in the pancreatic head/uncinate process, nonspecific but favored to reflect fatty infiltration consider attention on follow-up imaging versus further evaluation by pre and postcontrast enhanced MRC. 5. Nonobstructive 3 mm right interpolar renal stone. Electronically Signed   By: Maudry Mayhew M.D.   On: 06/18/2023 06:53    Labs:  CBC: No results for input(s): "WBC", "HGB", "HCT", "PLT" in the last 8760 hours.  COAGS: No results for input(s): "INR", "APTT" in the last 8760 hours.  BMP: Recent Labs    12/17/22 0744  NA 140  K 3.7  CL 106  CO2 26  GLUCOSE 90  BUN 12  CALCIUM 9.4  CREATININE 0.85    LIVER FUNCTION TESTS: Recent Labs    12/17/22 0744  BILITOT 0.4  AST 22  ALT 27  ALKPHOS 81  PROT 7.0  ALBUMIN 4.2    TUMOR MARKERS: No results for input(s): "AFPTM", "CEA", "CA199", "CHROMGRNA" in the last 8760 hours.  Assessment and Plan:  Pt with hx newly diagnosed left breast cancer;  scheduled today for image guided port a catheter insertion    Risks and benefits of image guided port-a-catheter placement was discussed with the patient including, but not limited to bleeding, infection, pneumothorax, or fibrin sheath development and need for additional procedures.  All of the patient's questions were answered, patient is agreeable to proceed. Consent  signed and in  chart.  Thank you for allowing our service to participate in Enriqueta Augusta 's care.  Electronically Signed: Loman Brooklyn, PA-C  Caryn Bee Katrena Stehlin,PA-C 07/14/2023, 11:03 AM    I spent a total of  30 Minutes   in face to face in clinical consultation, greater than 50% of which was counseling/coordinating care for image guided port a catheter placement.

## 2023-07-15 ENCOUNTER — Ambulatory Visit (HOSPITAL_COMMUNITY)
Admission: RE | Admit: 2023-07-15 | Discharge: 2023-07-15 | Disposition: A | Discharge status: Home / Self Care | Payer: Managed Care, Other (non HMO) | Source: Ambulatory Visit | Attending: Hematology and Oncology

## 2023-07-15 ENCOUNTER — Other Ambulatory Visit: Payer: Self-pay

## 2023-07-15 ENCOUNTER — Encounter (HOSPITAL_COMMUNITY): Payer: Self-pay

## 2023-07-15 DIAGNOSIS — C50812 Malignant neoplasm of overlapping sites of left female breast: Secondary | ICD-10-CM | POA: Diagnosis present

## 2023-07-15 DIAGNOSIS — Z170 Estrogen receptor positive status [ER+]: Secondary | ICD-10-CM

## 2023-07-15 HISTORY — PX: IR IMAGING GUIDED PORT INSERTION: IMG5740

## 2023-07-15 MED ORDER — SODIUM CHLORIDE 0.9 % IV SOLN
INTRAVENOUS | Status: DC
Start: 2023-07-15 — End: 2023-07-16

## 2023-07-15 MED ORDER — FENTANYL CITRATE (PF) 100 MCG/2ML IJ SOLN
INTRAMUSCULAR | Status: AC | PRN
  Administered 2023-07-15: 50 ug via INTRAVENOUS

## 2023-07-15 MED ORDER — HEPARIN SOD (PORK) LOCK FLUSH 100 UNIT/ML IV SOLN
INTRAVENOUS | Status: AC
  Filled 2023-07-15: qty 5

## 2023-07-15 MED ORDER — MIDAZOLAM HCL 2 MG/2ML IJ SOLN
INTRAMUSCULAR | Status: AC | PRN
  Administered 2023-07-15 (×2): 1 mg via INTRAVENOUS

## 2023-07-15 MED ORDER — FENTANYL CITRATE (PF) 100 MCG/2ML IJ SOLN
INTRAMUSCULAR | Status: AC
  Filled 2023-07-15: qty 2

## 2023-07-15 MED ORDER — MIDAZOLAM HCL 2 MG/2ML IJ SOLN
INTRAMUSCULAR | Status: AC
  Filled 2023-07-15: qty 2

## 2023-07-15 MED ORDER — HEPARIN SOD (PORK) LOCK FLUSH 100 UNIT/ML IV SOLN
500.0000 [IU] | Freq: Once | INTRAVENOUS | Status: AC
  Administered 2023-07-15: 500 [IU] via INTRAVENOUS

## 2023-07-15 MED ORDER — LIDOCAINE-EPINEPHRINE 1 %-1:100000 IJ SOLN
20.0000 mL | Freq: Once | INTRAMUSCULAR | Status: AC
  Administered 2023-07-15: 20 mL via INTRADERMAL

## 2023-07-15 MED ORDER — LIDOCAINE-EPINEPHRINE 1 %-1:100000 IJ SOLN
INTRAMUSCULAR | Status: AC
  Filled 2023-07-15: qty 1

## 2023-07-15 NOTE — Discharge Instructions (Signed)
 For questions /concerns may call Interventional Radiology at [phone] or  Interventional Radiology clinic [phone]   You may remove your dressing and shower tomorrow afternoon  DO NOT use EMLA cream for 2 weeks after port placement as the cream will remove surgical glue on your incision.                                                                   Implanted Port Insertion, Care After The following information offers guidance on how to care for yourself after your procedure. Your health care provider may also give you more specific instructions. If you have problems or questions, contact your health care provider. What can I expect after the procedure? After the procedure, it is common to have: Discomfort at the port insertion site. Bruising on the skin over the port. This should improve over 3-4 days. Follow these instructions at home: [facility] care After your port is placed, you will get a manufacturer's information card. The card has information about your port. Keep this card with you at all times. Take care of the port as told by your health care provider. Ask your health care provider if you or a family member can get training for taking care of the port at home. A home health care nurse will be be available to help care for the port. Make sure to remember what type of port you have. Incision care  Follow instructions from your health care provider about how to take care of your port insertion site. Make sure you: Wash your hands with soap and water for at least 20 seconds before and after you change your bandage (dressing). If soap and water are not available, use hand sanitizer. Change your dressing as told by your health care provider. Leave stitches (sutures), skin glue, or adhesive strips in place. These skin closures may need to stay in place for 2 weeks or longer. If adhesive strip edges start to loosen and curl up, you may trim the loose edges. Do not remove adhesive  strips completely unless your health care provider tells you to do that. Check your port insertion site every day for signs of infection. Check for: Redness, swelling, or pain. Fluid or blood. Warmth. Pus or a bad smell. Activity Return to your normal activities as told by your health care provider. Ask your health care provider what activities are safe for you. You may have to avoid lifting. Ask your health care provider how much you can safely lift. General instructions Take over-the-counter and prescription medicines only as told by your health care provider. Do not take baths, swim, or use a hot tub until your health care provider approves. Ask your health care provider if you may take showers. You may only be allowed to take sponge baths. If you were given a sedative during the procedure, it can affect you for several hours. Do not drive or operate machinery until your health care provider says that it is safe. Wear a medical alert bracelet in case of an emergency. This will tell any health care providers that you have a port. Keep all follow-up visits. This is important. Contact a health care provider if: You cannot flush your port with saline as directed, or you cannot draw blood  from the port. You have a fever or chills. You have redness, swelling, or pain around your port insertion site. You have fluid or blood coming from your port insertion site. Your port insertion site feels warm to the touch. You have pus or a bad smell coming from the port insertion site. Get help right away if: You have chest pain or shortness of breath. You have bleeding from your port that you cannot control. These symptoms may be an emergency. Get help right away. Call 911. Do not wait to see if the symptoms will go away. Do not drive yourself to the hospital. Summary Take care of the port as told by your health care provider. Keep the manufacturer's information card with you at all times. Change your  dressing as told by your health care provider. Contact a health care provider if you have a fever or chills or if you have redness, swelling, or pain around your port insertion site. Keep all follow-up visits. This information is not intended to replace advice given to you by your health care provider. Make sure you discuss any questions you have with your health care provider. Document Revised: 11/07/2020 Document Reviewed: 11/07/2020 Elsevier Patient Education  2024 [employer] Inc.                                                         Moderate Conscious Sedation, Adult, Care After After the procedure, it is common to have: Sleepiness for a few hours. Impaired judgment for a few hours. Trouble with balance. Nausea or vomiting if you eat too soon. Follow these instructions at home: For the time period you were told by your health care provider:  Rest. Do not participate in activities where you could fall or become injured. Do not drive or use machinery. Do not drink alcohol. Do not take sleeping pills or medicines that cause drowsiness. Do not make important decisions or sign legal documents. Do not take care of children on your own. Eating and drinking Follow instructions from your health care provider about what you may eat and drink. Drink enough fluid to keep your urine pale yellow. If you vomit: Drink clear fluids slowly and in small amounts as you are able. Clear fluids include water, ice chips, low-calorie sports drinks, and fruit juice that has water added to it (diluted fruit juice). Eat light and bland foods in small amounts as you are able. These foods include bananas, applesauce, rice, lean meats, toast, and crackers. General instructions Take over-the-counter and prescription medicines only as told by your health care provider. Have a responsible adult stay with you for the time you are told. Do not use any products that contain nicotine or tobacco. These products  include cigarettes, chewing tobacco, and vaping devices, such as e-cigarettes. If you need help quitting, ask your health care provider. Return to your normal activities as told by your health care provider. Ask your health care provider what activities are safe for you. Your health care provider may give you more instructions. Make sure you know what you can and cannot do. Contact a health care provider if: You are still sleepy or having trouble with balance after 24 hours. You feel light-headed. You vomit every time you eat or drink. You get a rash. You have a fever. You have redness  or swelling around the IV site. Get help right away if: You have trouble breathing. You start to feel confused at home. These symptoms may be an emergency. Get help right away. Call 911. Do not wait to see if the symptoms will go away. Do not drive yourself to the hospital. This information is not intended to replace advice given to you by your health care provider. Make sure you discuss any questions you have with your health care provider. Document Revised: 11/19/2021 Document Reviewed: 11/19/2021 Elsevier Patient Education  2024 [employer].

## 2023-07-15 NOTE — Procedures (Signed)
 Interventional Radiology Procedure Note  Procedure: Single Lumen Power Port Placement    Access:  Right internal jugular vein  Findings: Catheter tip positioned at cavoatrial junction. Port is ready for immediate use.   Complications: None  EBL: < 10 mL  Recommendations:  - Ok to shower in 24 hours - Do not submerge for 7 days - Routine line care    Marliss Coots, MD

## 2023-07-16 ENCOUNTER — Inpatient Hospital Stay: Payer: Managed Care, Other (non HMO) | Admitting: Pharmacist

## 2023-07-16 ENCOUNTER — Inpatient Hospital Stay: Payer: Managed Care, Other (non HMO)

## 2023-07-16 DIAGNOSIS — C50812 Malignant neoplasm of overlapping sites of left female breast: Secondary | ICD-10-CM

## 2023-07-16 NOTE — Progress Notes (Signed)
 [facility]       Telephone: [phone]?Fax: [phone]   Oncology Clinical Pharmacist Practitioner Initial Assessment  Kelsey Carlson is a 58 y.o. female with a diagnosis of breast cancer. They were contacted today via in-person visit.  Indication/Regimen Doxorubicin (Adriamycin) and cyclophosphamide (Cytoxan) followed by paclitaxel (Taxol) are being used appropriately for treatment of breast cancer by Dr. Serena Croissant.      Wt Readings from Last 1 Encounters:  07/15/23 (P) 185 lb (83.9 kg)    Estimated body surface area is 1.95 meters squared (pended) as calculated from the following:   Height as of 07/15/23: (P) 5\' 4"  (1.626 m).   Weight as of 07/15/23: (P) 185 lb (83.9 kg).  The dosing regimen is every 14 days for 4 cycles  Doxorubicin (60 mg/m2) on Day 1 Cyclophosphamide (600 mg/m2) on Day 1 Pegfilgrastim (6 mg) on Day 3  Followed by a dosing regimen that is every 7 days for 12 cycles  Paclitaxel (80 mg/m2) on Day 1  It is planned to continue until treatment plan completion or unacceptable toxicity. The tentative start date is: 07/21/23  Dose Modifications Per Dr. Pamelia Hoit, dexamethasone prescription will be taken for 2 days starting the day after chemotherapy   Allergies No Known Allergies  Vitals: No vitals or labs were done today for this chemotherapy education visit     07/15/2023    4:47 PM 07/15/2023    4:30 PM 07/15/2023    4:15 PM  Oncology Vitals  Pulse Rate 80 81 81  BP 116/78 124/78 122/61  Resp 18 16 16   SpO2 98 % 96 % 97 %     Laboratory Data    Latest Ref Rng & Units 08/19/2017    9:12 AM 07/03/2017   12:39 PM 01/04/2016    3:01 PM  CBC EXTENDED  WBC 4.0 - 10.5 K/uL 7.3  10.1  12.3   RBC 3.87 - 5.11 MIL/uL 4.69  4.77  4.53   Hemoglobin 12.0 - 15.0 g/dL 95.2  84.1  32.4   HCT 36.0 - 46.0 % 43.3  43.6  41.3   Platelets 150 - 400 K/uL 361  397.0  380.0   NEUT# 1.4 - 7.7 K/uL  6.9  9.3   Lymph# 0.7 - 4.0 K/uL  1.9  1.8         Latest Ref Rng & Units 12/17/2022    7:44 AM 10/11/2021    4:56 PM 06/08/2020    8:12 AM  CMP  Glucose 70 - 99 mg/dL 90  99  90   BUN 6 - 23 mg/dL 12  13  12    Creatinine 0.40 - 1.20 mg/dL 4.01  0.27  2.53   Sodium 135 - 145 mEq/L 140  143  138   Potassium 3.5 - 5.1 mEq/L 3.7  4.3  4.2   Chloride 96 - 112 mEq/L 106  106  105   CO2 19 - 32 mEq/L 26  27  28    Calcium 8.4 - 10.5 mg/dL 9.4  9.6  9.3   Total Protein 6.0 - 8.3 g/dL 7.0  6.7  7.1   Total Bilirubin 0.2 - 1.2 mg/dL 0.4  0.4  0.6   Alkaline Phos 39 - 117 U/L 81  89  78   AST 0 - 37 U/L 22  20  19    ALT 0 - 35 U/L 27  29  24     Contraindications Contraindications were reviewed? Yes Contraindications to  therapy were identified? No   Safety Precautions The following safety precautions were reviewed:  Fever: reviewed the importance of having a thermometer and the Centers for Disease Control and Prevention (CDC) definition of fever which is 100.57F (38C) or higher. Patient should call 24/7 triage at [phone] if experiencing a fever or any other symptoms Decreased white blood cells (WBCs) and increased risk for infection Decreased platelet count and increased risk of bleeding Decreased hemoglobin, part of the red blood cells that carry iron and oxygen Change in the color of urine Fatigue Hair loss Nausea or vomiting Mouth irritation or sores Doxorubicin (vesicant) Cardiotoxicity Hemorrhagic cystitis Secondary cancers Muscle or joint pain or weakness Peripheral neuropathy Hypersensitivity reactions Avoid grapefruit products Intimacy, sexual activity, contraception, fertility Handling body fluids and waste  Medication Reconciliation Current Outpatient Medications  Medication Sig Dispense Refill   anastrozole (ARIMIDEX) 1 MG tablet Take 1 tablet (1 mg total) by mouth daily. 90 tablet 3   dexamethasone (DECADRON) 4 MG tablet Take 1 tablet day after chemo and 1 tablet 2 days after chemo with food (Patient not  taking: Reported on 07/16/2023) 8 tablet 0   gabapentin (NEURONTIN) 300 MG capsule Take 300 mg by mouth daily as needed. (Patient not taking: Reported on 07/16/2023)     lidocaine-prilocaine (EMLA) cream Apply to affected area once (Patient not taking: Reported on 07/16/2023) 30 g 3   naproxen (NAPROSYN) 500 MG tablet Take 500 mg by mouth 2 (two) times daily as needed. (Patient not taking: Reported on 07/16/2023)     ondansetron (ZOFRAN) 8 MG tablet Take 1 tab (8 mg) by mouth every 8 hrs as needed for nausea/vomiting. Start third day after doxorubicin/cyclophosphamide chemotherapy. (Patient not taking: Reported on 07/16/2023) 30 tablet 1   prochlorperazine (COMPAZINE) 10 MG tablet Take 1 tablet (10 mg total) by mouth every 6 (six) hours as needed for nausea or vomiting. (Patient not taking: Reported on 07/16/2023) 30 tablet 1   No current facility-administered medications for this visit.    Medication reconciliation is based on the patient's most recent medication list in the electronic medical record (EMR) including herbal products and OTC medications.   The patient's medication list was reviewed today with the patient? Yes   Drug-drug interactions (DDIs) DDIs were evaluated? Yes Significant DDIs identified? No   Drug-Food Interactions Drug-food interactions were evaluated? Yes Drug-food interactions identified?  Avoid grapefruit products  Follow-up Plan  Treatment start date: 07/21/23 Port placement date: 07/15/23 ECHO date: 07/09/23 We reviewed the prescriptions, premedications, and treatment regimen with the patient. Possible side effects of the treatment regimen were reviewed and management strategies were discussed.  Can use loperamide (Imodium) as needed for diarrhea, loratadine (Claritin) as needed for G-CSF bone pain, and Senna-S as needed for constipation.  Clinical pharmacy will assist Dr. Serena Croissant and [facility] on an as needed basis going forward  Galen Daft  participated in the discussion, expressed understanding, and voiced agreement with the above plan. All questions were answered to her satisfaction. The patient was advised to contact the clinic at [phone] with any questions or concerns prior to her return visit.   I spent 60 minutes assessing the patient.  Kinslea Frances A. Odetta Pink, PharmD, BCOP, CPP  Anselm Lis, RPH-CPP, 07/16/2023 5:05 PM  **Disclaimer: This note was dictated with voice recognition software. Similar sounding words can inadvertently be transcribed and this note may contain transcription errors which may not have been corrected upon publication of note.**

## 2023-07-18 MED FILL — Fosaprepitant Dimeglumine For IV Infusion 150 MG (Base Eq): INTRAVENOUS | Qty: 5 | Status: AC

## 2023-07-19 DIAGNOSIS — C801 Malignant (primary) neoplasm, unspecified: Secondary | ICD-10-CM

## 2023-07-19 HISTORY — DX: Malignant (primary) neoplasm, unspecified: C80.1

## 2023-07-21 ENCOUNTER — Inpatient Hospital Stay: Payer: Managed Care, Other (non HMO)

## 2023-07-21 ENCOUNTER — Inpatient Hospital Stay: Payer: Managed Care, Other (non HMO) | Attending: Hematology and Oncology

## 2023-07-21 VITALS — BP 137/74 | HR 88 | Temp 97.6°F | Resp 18

## 2023-07-21 DIAGNOSIS — Z95828 Presence of other vascular implants and grafts: Secondary | ICD-10-CM | POA: Insufficient documentation

## 2023-07-21 DIAGNOSIS — D709 Neutropenia, unspecified: Secondary | ICD-10-CM | POA: Insufficient documentation

## 2023-07-21 DIAGNOSIS — C50812 Malignant neoplasm of overlapping sites of left female breast: Secondary | ICD-10-CM | POA: Diagnosis present

## 2023-07-21 DIAGNOSIS — B370 Candidal stomatitis: Secondary | ICD-10-CM | POA: Diagnosis not present

## 2023-07-21 DIAGNOSIS — Z79899 Other long term (current) drug therapy: Secondary | ICD-10-CM | POA: Insufficient documentation

## 2023-07-21 DIAGNOSIS — T451X5A Adverse effect of antineoplastic and immunosuppressive drugs, initial encounter: Secondary | ICD-10-CM | POA: Diagnosis not present

## 2023-07-21 DIAGNOSIS — R109 Unspecified abdominal pain: Secondary | ICD-10-CM | POA: Insufficient documentation

## 2023-07-21 DIAGNOSIS — K2970 Gastritis, unspecified, without bleeding: Secondary | ICD-10-CM | POA: Insufficient documentation

## 2023-07-21 DIAGNOSIS — R112 Nausea with vomiting, unspecified: Secondary | ICD-10-CM | POA: Diagnosis not present

## 2023-07-21 DIAGNOSIS — Z9221 Personal history of antineoplastic chemotherapy: Secondary | ICD-10-CM | POA: Diagnosis not present

## 2023-07-21 DIAGNOSIS — Z5189 Encounter for other specified aftercare: Secondary | ICD-10-CM | POA: Insufficient documentation

## 2023-07-21 DIAGNOSIS — Z170 Estrogen receptor positive status [ER+]: Secondary | ICD-10-CM | POA: Diagnosis not present

## 2023-07-21 DIAGNOSIS — Z79811 Long term (current) use of aromatase inhibitors: Secondary | ICD-10-CM | POA: Insufficient documentation

## 2023-07-21 LAB — CMP (CANCER CENTER ONLY)
ALT: 22 U/L (ref 0–44)
AST: 17 U/L (ref 15–41)
Albumin: 3.9 g/dL (ref 3.5–5.0)
Alkaline Phosphatase: 87 U/L (ref 38–126)
Anion gap: 6 (ref 5–15)
BUN: 12 mg/dL (ref 6–20)
CO2: 27 mmol/L (ref 22–32)
Calcium: 9.1 mg/dL (ref 8.9–10.3)
Chloride: 107 mmol/L (ref 98–111)
Creatinine: 0.74 mg/dL (ref 0.44–1.00)
GFR, Estimated: >60 mL/min (ref >60)
Glucose, Bld: 96 mg/dL (ref 70–99)
Potassium: 3.7 mmol/L (ref 3.5–5.1)
Sodium: 140 mmol/L (ref 135–145)
Total Bilirubin: 0.5 mg/dL (ref 0.0–1.2)
Total Protein: 6.7 g/dL (ref 6.5–8.1)

## 2023-07-21 LAB — CBC WITH DIFFERENTIAL (CANCER CENTER ONLY)
Abs Immature Granulocytes: 0.01 10*3/uL (ref 0.00–0.07)
Basophils Absolute: 0 10*3/uL (ref 0.0–0.1)
Basophils Relative: 1 %
Eosinophils Absolute: 0.3 10*3/uL (ref 0.0–0.5)
Eosinophils Relative: 4 %
HCT: 40.5 % (ref 36.0–46.0)
Hemoglobin: 13.8 g/dL (ref 12.0–15.0)
Immature Granulocytes: 0 %
Lymphocytes Relative: 22 %
Lymphs Abs: 1.4 10*3/uL (ref 0.7–4.0)
MCH: 30.7 pg (ref 26.0–34.0)
MCHC: 34.1 g/dL (ref 30.0–36.0)
MCV: 90 fL (ref 80.0–100.0)
Monocytes Absolute: 0.8 10*3/uL (ref 0.1–1.0)
Monocytes Relative: 12 %
Neutro Abs: 4 10*3/uL (ref 1.7–7.7)
Neutrophils Relative %: 61 %
Platelet Count: 291 10*3/uL (ref 150–400)
RBC: 4.5 MIL/uL (ref 3.87–5.11)
RDW: 11.9 % (ref 11.5–15.5)
WBC Count: 6.5 10*3/uL (ref 4.0–10.5)
nRBC: 0 % (ref 0.0–0.2)

## 2023-07-21 MED ORDER — SODIUM CHLORIDE 0.9% FLUSH
10.0000 mL | Freq: Once | INTRAVENOUS | Status: AC
  Administered 2023-07-21: 10 mL

## 2023-07-21 MED ORDER — DEXAMETHASONE SODIUM PHOSPHATE 10 MG/ML IJ SOLN
10.0000 mg | Freq: Once | INTRAMUSCULAR | Status: AC
  Administered 2023-07-21: 10 mg via INTRAVENOUS
  Filled 2023-07-21: qty 1

## 2023-07-21 MED ORDER — SODIUM CHLORIDE 0.9 % IV SOLN
600.0000 mg/m2 | Freq: Once | INTRAVENOUS | Status: AC
  Administered 2023-07-21: 1180 mg via INTRAVENOUS
  Filled 2023-07-21: qty 59

## 2023-07-21 MED ORDER — PALONOSETRON HCL INJECTION 0.25 MG/5ML
0.2500 mg | Freq: Once | INTRAVENOUS | Status: AC
  Administered 2023-07-21: 0.25 mg via INTRAVENOUS
  Filled 2023-07-21: qty 5

## 2023-07-21 MED ORDER — DOXORUBICIN HCL CHEMO IV INJECTION 2 MG/ML
60.0000 mg/m2 | Freq: Once | INTRAVENOUS | Status: AC
  Administered 2023-07-21: 118 mg via INTRAVENOUS
  Filled 2023-07-21: qty 59

## 2023-07-21 MED ORDER — SODIUM CHLORIDE 0.9% FLUSH
10.0000 mL | INTRAVENOUS | Status: DC | PRN
  Administered 2023-07-21: 10 mL

## 2023-07-21 MED ORDER — SODIUM CHLORIDE 0.9 % IV SOLN: INTRAVENOUS | Status: DC

## 2023-07-21 MED ORDER — HEPARIN SOD (PORK) LOCK FLUSH 100 UNIT/ML IV SOLN
500.0000 [IU] | Freq: Once | INTRAVENOUS | Status: AC | PRN
  Administered 2023-07-21: 500 [IU]

## 2023-07-21 MED ORDER — SODIUM CHLORIDE 0.9 % IV SOLN
150.0000 mg | Freq: Once | INTRAVENOUS | Status: AC
  Administered 2023-07-21: 150 mg via INTRAVENOUS
  Filled 2023-07-21: qty 150

## 2023-07-21 NOTE — Patient Instructions (Signed)
 [facility] - A DEPT OF [facility]  Discharge Instructions: Thank you for choosing [facility] to provide your oncology and hematology care.   If you have a lab appointment with the Cancer Center, please go directly to the Cancer Center and check in at the registration area.   Wear comfortable clothing and clothing appropriate for easy access to any Portacath or PICC line.   We strive to give you quality time with your provider. You may need to reschedule your appointment if you arrive late (15 or more minutes).  Arriving late affects you and other patients whose appointments are after yours.  Also, if you miss three or more appointments without notifying the office, you may be dismissed from the clinic at the provider's discretion.      For prescription refill requests, have your pharmacy contact our office and allow 72 hours for refills to be completed.    Today you received the following chemotherapy and/or immunotherapy agents: Adriamycin, Cytoxan.       To help prevent nausea and vomiting after your treatment, we encourage you to take your nausea medication as directed.  BELOW ARE SYMPTOMS THAT SHOULD BE REPORTED IMMEDIATELY: *FEVER GREATER THAN 100.4 F (38 C) OR HIGHER *CHILLS OR SWEATING *NAUSEA AND VOMITING THAT IS NOT CONTROLLED WITH YOUR NAUSEA MEDICATION *UNUSUAL SHORTNESS OF BREATH *UNUSUAL BRUISING OR BLEEDING *URINARY PROBLEMS (pain or burning when urinating, or frequent urination) *BOWEL PROBLEMS (unusual diarrhea, constipation, pain near the anus) TENDERNESS IN MOUTH AND THROAT WITH OR WITHOUT PRESENCE OF ULCERS (sore throat, sores in mouth, or a toothache) UNUSUAL RASH, SWELLING OR PAIN  UNUSUAL VAGINAL DISCHARGE OR ITCHING   Items with * indicate a potential emergency and should be followed up as soon as possible or go to the Emergency Department if any problems should occur.  Please show the CHEMOTHERAPY ALERT CARD or  IMMUNOTHERAPY ALERT CARD at check-in to the Emergency Department and triage nurse.  Should you have questions after your visit or need to cancel or reschedule your appointment, please contact [facility] - A DE[facility]  Dept: [phone]  and follow the prompts.  Office hours are 8:00 a.m. to 4:30 p.m. Monday - Friday. Please note that voicemails left after 4:00 p.m. may not be returned until the following business day.  We are closed weekends and major holidays. You have access to a nurse at all times for urgent questions. Please call the main number to the clinic Dept: [phone] and follow the prompts.   For any non-urgent questions, you may also contact your provider using MyChart. We now offer e-Visits for anyone 60 and older to request care online for non-urgent symptoms. For details visit [email or web address].   Also download the MyChart app! Go to the app store, search "MyChart", open the app, select [place], and log in with your MyChart username and password.

## 2023-07-22 ENCOUNTER — Telehealth: Payer: Self-pay | Admitting: *Deleted

## 2023-07-22 NOTE — Telephone Encounter (Signed)
 Called pt to see how she was doing post chemo.  She reports doing well.  She has had some nausea this am but took her nausea med which helped.  She knows her next appt & how to reach [place] if needed.  Discussed claritin/tylenol use for pegfilgrastim.  Discussed hair loss.

## 2023-07-22 NOTE — Telephone Encounter (Signed)
-----   Message from Nurse Cortney P sent at 07/21/2023  1:25 PM EST ----- Regarding: Dr Pamelia Hoit - first time Adriamycin, Cytoxan Dr Pamelia Hoit patient, first time Adriamycin, Cytoxan. Patient tolerated treatment well.

## 2023-07-23 ENCOUNTER — Inpatient Hospital Stay: Payer: Managed Care, Other (non HMO)

## 2023-07-23 VITALS — BP 132/69 | HR 70 | Resp 17

## 2023-07-23 DIAGNOSIS — C50812 Malignant neoplasm of overlapping sites of left female breast: Secondary | ICD-10-CM | POA: Diagnosis not present

## 2023-07-23 DIAGNOSIS — Z170 Estrogen receptor positive status [ER+]: Secondary | ICD-10-CM

## 2023-07-23 MED ORDER — PEGFILGRASTIM-CBQV 6 MG/0.6ML ~~LOC~~ SOSY
6.0000 mg | PREFILLED_SYRINGE | Freq: Once | SUBCUTANEOUS | Status: AC
  Administered 2023-07-23: 6 mg via SUBCUTANEOUS
  Filled 2023-07-23: qty 0.6

## 2023-07-23 NOTE — Patient Instructions (Signed)

## 2023-07-24 ENCOUNTER — Telehealth: Payer: Self-pay | Admitting: *Deleted

## 2023-07-24 NOTE — Telephone Encounter (Signed)
 Received call from pt with complaint of acid reflux post tx.  RN reviewed with MD and verbal orders received for pt to take OTC Pepcid 20 mg p.o BID x1 week and then decrease to once a day until the completion of tx.  Pt educated and verbalized understanding.

## 2023-07-25 ENCOUNTER — Other Ambulatory Visit: Payer: Self-pay | Admitting: *Deleted

## 2023-07-25 DIAGNOSIS — C50812 Malignant neoplasm of overlapping sites of left female breast: Secondary | ICD-10-CM

## 2023-07-28 ENCOUNTER — Inpatient Hospital Stay (HOSPITAL_BASED_OUTPATIENT_CLINIC_OR_DEPARTMENT_OTHER): Payer: Managed Care, Other (non HMO) | Admitting: Hematology and Oncology

## 2023-07-28 ENCOUNTER — Inpatient Hospital Stay: Payer: Managed Care, Other (non HMO)

## 2023-07-28 VITALS — BP 122/73 | HR 87 | Temp 97.8°F | Resp 18 | Ht 64.0 in | Wt 184.8 lb

## 2023-07-28 DIAGNOSIS — C50812 Malignant neoplasm of overlapping sites of left female breast: Secondary | ICD-10-CM

## 2023-07-28 DIAGNOSIS — Z95828 Presence of other vascular implants and grafts: Secondary | ICD-10-CM

## 2023-07-28 DIAGNOSIS — Z170 Estrogen receptor positive status [ER+]: Secondary | ICD-10-CM | POA: Diagnosis not present

## 2023-07-28 LAB — CBC WITH DIFFERENTIAL (CANCER CENTER ONLY)
Abs Immature Granulocytes: 0 10*3/uL (ref 0.00–0.07)
Basophils Absolute: 0 10*3/uL (ref 0.0–0.1)
Basophils Relative: 1 %
Eosinophils Absolute: 0.1 10*3/uL (ref 0.0–0.5)
Eosinophils Relative: 9 %
HCT: 35.8 % — ABNORMAL LOW (ref 36.0–46.0)
Hemoglobin: 12.3 g/dL (ref 12.0–15.0)
Immature Granulocytes: 0 %
Lymphocytes Relative: 73 %
Lymphs Abs: 0.7 10*3/uL (ref 0.7–4.0)
MCH: 30.7 pg (ref 26.0–34.0)
MCHC: 34.4 g/dL (ref 30.0–36.0)
MCV: 89.3 fL (ref 80.0–100.0)
Monocytes Absolute: 0.1 10*3/uL (ref 0.1–1.0)
Monocytes Relative: 6 %
Neutro Abs: 0.1 10*3/uL — CL (ref 1.7–7.7)
Neutrophils Relative %: 11 %
Platelet Count: 113 10*3/uL — ABNORMAL LOW (ref 150–400)
RBC: 4.01 MIL/uL (ref 3.87–5.11)
RDW: 11.6 % (ref 11.5–15.5)
Smear Review: NORMAL
WBC Count: 0.9 10*3/uL — CL (ref 4.0–10.5)
nRBC: 0 % (ref 0.0–0.2)

## 2023-07-28 LAB — CMP (CANCER CENTER ONLY)
ALT: 32 U/L (ref 0–44)
AST: 14 U/L — ABNORMAL LOW (ref 15–41)
Albumin: 3.6 g/dL (ref 3.5–5.0)
Alkaline Phosphatase: 103 U/L (ref 38–126)
Anion gap: 8 (ref 5–15)
BUN: 19 mg/dL (ref 6–20)
CO2: 25 mmol/L (ref 22–32)
Calcium: 8.7 mg/dL — ABNORMAL LOW (ref 8.9–10.3)
Chloride: 102 mmol/L (ref 98–111)
Creatinine: 0.73 mg/dL (ref 0.44–1.00)
GFR, Estimated: >60 mL/min (ref >60)
Glucose, Bld: 104 mg/dL — ABNORMAL HIGH (ref 70–99)
Potassium: 3.8 mmol/L (ref 3.5–5.1)
Sodium: 135 mmol/L (ref 135–145)
Total Bilirubin: 0.6 mg/dL (ref 0.0–1.2)
Total Protein: 6.6 g/dL (ref 6.5–8.1)

## 2023-07-28 MED ORDER — SODIUM CHLORIDE 0.9% FLUSH
10.0000 mL | Freq: Once | INTRAVENOUS | Status: AC
Start: 2023-07-28 — End: 2023-07-28
  Administered 2023-07-28: 10 mL

## 2023-07-28 MED ORDER — HEPARIN SOD (PORK) LOCK FLUSH 100 UNIT/ML IV SOLN
500.0000 [IU] | Freq: Once | INTRAVENOUS | Status: AC
  Administered 2023-07-28: 500 [IU]

## 2023-07-28 MED ORDER — FLUCONAZOLE 100 MG PO TABS: 100.0000 mg | ORAL_TABLET | Freq: Every day | ORAL | 0 refills | Status: DC

## 2023-07-28 MED ORDER — PANTOPRAZOLE SODIUM 40 MG PO TBEC: 40.0000 mg | DELAYED_RELEASE_TABLET | Freq: Every day | ORAL | 1 refills | Status: DC

## 2023-07-28 NOTE — Assessment & Plan Note (Signed)
 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 Luisa Hart Adjuvant radiation Adjuvant antiestrogen therapy ------------------------------------------------------------------------------------------------------------------------------------ Current treatment: Neoadjuvant dose since Adriamycin Cytoxan cycle 1 day 1 Labs reviewed, chemo education completed, chemo consent obtained, antiemetics were reviewed 07/09/2023: Echo: EF 60 to 65% Return to clinic in 1 week for toxicity check

## 2023-07-28 NOTE — Progress Notes (Signed)
 Patient Care Team: Excell Seltzer, MD as PCP - General Donnelly Angelica, RN as Oncology Nurse Navigator Pershing Proud, RN as Oncology Nurse Navigator Serena Croissant, MD as Consulting Physician (Hematology and Oncology) Harriette Bouillon, MD as Consulting Physician (General Surgery) Dorothy Puffer, MD as Consulting Physician (Radiation Oncology)  DIAGNOSIS:  Encounter Diagnosis  Name Primary?   Malignant neoplasm of overlapping sites of left breast in female, estrogen receptor positive (HCC) Yes    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 (HCC)   06/10/2023 Cancer Staging   Staging form: Breast, AJCC 8th Edition - Clinical: cT4, cN0, cM0, GX, ER+, PR+, HER2- - Signed by Serena Croissant, MD on 06/10/2023 Stage prefix: Initial diagnosis Histologic grading system: 3 grade system   07/21/2023 -  Chemotherapy   Patient is on Treatment Plan : BREAST DOSE DENSE AC q14d / PACLitaxel q7d       CHIEF COMPLIANT: Cycle 1 day 8 Adriamycin Cytoxan  HISTORY OF PRESENT ILLNESS:  History of Present Illness The patient, currently undergoing chemotherapy for cancer, presents with severe abdominal pain, vomiting, and bowel irregularities. The abdominal pain, described as excruciating, began on the third day after chemotherapy and persisted despite taking nausea medication. The patient also experienced vomiting, but not of food, rather of a yellow substance, possibly bile. This occurred on the fourth and fifth days after chemotherapy.  The patient also reports significant constipation, not having a bowel movement for nearly a week, which was followed by diarrhea. This pattern of bowel irregularity is not unusual for the patient, who typically has bowel movements every three days, often followed by diarrhea.  In addition to these symptoms,  the patient also reports mouth sores, which upon examination by the doctor, were identified as thrush. The patient also mentions a significant increase in fatigue and decrease in activity, spending two days mostly in bed.  The patient has been trying to manage her symptoms by drinking plenty of water, eating vegetables, and consuming protein shakes. She has also been taking an antacid, Pepcid, as recommended by a nurse, but this did not provide relief from the abdominal pain.     ALLERGIES:  is allergic to chlorhexidine.  MEDICATIONS:  Current Outpatient Medications  Medication Sig Dispense Refill   fluconazole (DIFLUCAN) 100 MG tablet Take 1 tablet (100 mg total) by mouth daily. 15 tablet 0   pantoprazole (PROTONIX) 40 MG tablet Take 1 tablet (40 mg total) by mouth daily. 30 tablet 1   anastrozole (ARIMIDEX) 1 MG tablet Take 1 tablet (1 mg total) by mouth daily. (Patient not taking: Reported on 07/28/2023) 90 tablet 3   dexamethasone (DECADRON) 4 MG tablet Take 1 tablet day after chemo and 1 tablet 2 days after chemo with food (Patient not taking: Reported on 07/28/2023) 8 tablet 0   gabapentin (NEURONTIN) 300 MG capsule Take 300 mg by mouth daily as needed. (Patient not taking: Reported on 06/11/2023)     lidocaine-prilocaine (EMLA) cream Apply to affected area once (Patient not taking: Reported on 07/28/2023) 30 g 3   naproxen (NAPROSYN) 500 MG tablet Take 500 mg by mouth 2 (two) times daily as needed. (Patient not taking: Reported on 07/28/2023)     ondansetron (ZOFRAN) 8 MG tablet Take 1 tab (8 mg) by mouth every 8 hrs as needed for nausea/vomiting. Start third day after doxorubicin/cyclophosphamide  chemotherapy. (Patient not taking: Reported on 07/28/2023) 30 tablet 1   prochlorperazine (COMPAZINE) 10 MG tablet Take 1 tablet (10 mg total) by mouth every 6 (six) hours as needed for nausea or vomiting. (Patient not taking: Reported on 07/28/2023) 30 tablet 1   No current facility-administered  medications for this visit.    PHYSICAL EXAMINATION: ECOG PERFORMANCE STATUS: 1 - Symptomatic but completely ambulatory  Vitals:   07/28/23 1548  BP: 122/73  Pulse: 87  Resp: 18  Temp: 97.8 F (36.6 C)  SpO2: 100%   Filed Weights   07/28/23 1548  Weight: 184 lb 12.8 oz (83.8 kg)    Physical Exam HEENT: Oral cavity with thrush.  (exam performed in the presence of a chaperone)  LABORATORY DATA:  I have reviewed the data as listed    Latest Ref Rng & Units 07/28/2023    3:08 PM 07/21/2023    8:15 AM 12/17/2022    7:44 AM  CMP  Glucose 70 - 99 mg/dL 161  96  90   BUN 6 - 20 mg/dL 19  12  12    Creatinine 0.44 - 1.00 mg/dL 0.96  0.45  4.09   Sodium 135 - 145 mmol/L 135  140  140   Potassium 3.5 - 5.1 mmol/L 3.8  3.7  3.7   Chloride 98 - 111 mmol/L 102  107  106   CO2 22 - 32 mmol/L 25  27  26    Calcium 8.9 - 10.3 mg/dL 8.7  9.1  9.4   Total Protein 6.5 - 8.1 g/dL 6.6  6.7  7.0   Total Bilirubin 0.0 - 1.2 mg/dL 0.6  0.5  0.4   Alkaline Phos 38 - 126 U/L 103  87  81   AST 15 - 41 U/L 14  17  22    ALT 0 - 44 U/L 32  22  27     Lab Results  Component Value Date   WBC 0.9 (LL) 07/28/2023   HGB 12.3 07/28/2023   HCT 35.8 (L) 07/28/2023   MCV 89.3 07/28/2023   PLT 113 (L) 07/28/2023   NEUTROABS 0.1 (LL) 07/28/2023    ASSESSMENT & PLAN:  Malignant neoplasm of overlapping sites of left female breast (HCC) 05/23/2023: Left nipple punch biopsy: Infiltrating carcinoma consistent with breast origin (CK7 GATA3 and ER positive) ER 30%, PR 20%, HER2 1+ (send T4, N0, M0, stage IIIb) 06/02/2023: Breast MRI: 2.8 cm biopsy-proven malignancy left nipple retroareolar left breast, 5 cm enhancement UOQ left breast posterior superior to the biopsy-proven malignancy, highly suspicious left breast enhancement spanning 9.8 cm. 06/18/2023: Left breast biopsy 2:30 position: Grade 2 IDC with DCIS ER 75%, PR 75%, Ki67 20%, HER2 1+ negative   Treatment plan: Biopsy on the 5 cm of enhancement: Grade  2 IDC, ER 75%, PR 75%, Ki 67: 20%, HER 2 1+ Neg CT scans and bone scan: 06/18/23:Left breast mass 2.2 cm, prom axillary LN, no distant mets, non specific pancreatic head/ uncinate mass 07/02/2023: Oncotype DX: 30 (distant recurrence at 9 years: 19%) Recommend neoadjuvant chemotherapy with dose dense Adriamycin and Cytoxan x 4 followed by Taxol x 12 Mastectomy versus lumpectomy (patient wishes to do bilateral breast reductions if possible): Will be determined by Dr. Luisa Hart Adjuvant radiation Adjuvant antiestrogen therapy ------------------------------------------------------------------------------------------------------------------------------------ Current treatment: Neoadjuvant dose since Adriamycin Cytoxan cycle 1 day 1 07/09/2023: Echo: EF 60 to 65% Chemo toxicities: Neutropenia: I will reduce the dosage of cycle 2. Gastritis: Sent a prescription for Protonix. Oral thrush: Sent  a prescription for Diflucan  Her biggest complaint has been this epigastric pain which is probably due to gastritis. Return to clinic in 1 week for cycle 2 ------------------------------------- Assessment and Plan Assessment & Plan Chemotherapy-induced nausea and vomiting Significant nausea and vomiting post-chemotherapy. Protonix recommended for acid reflux as Pepcid was ineffective. - Prescribe Protonix daily. - Use Pepcid as needed. - Continue anti-nausea medication as needed.  Chemotherapy-induced constipation and diarrhea Constipation followed by diarrhea post-chemotherapy. Proactive bowel management advised. - Take stool softener for three days starting on chemotherapy day. - Use laxatives as needed. - Monitor bowel movements and adjust stool softener use.  Chemotherapy-induced oral thrush Oral thrush developed post-chemotherapy, treatable with antifungal medication. - Prescribe antifungal pills for five days. - Gargle with salt water or baking soda solution post-chemotherapy.  Neutropenia White  blood cell count dropped significantly post-chemotherapy. Dose adjustment planned to prevent severe drops. - Administer growth factor shot two days post-chemotherapy. - Adjust chemotherapy dose to maintain white blood cell count above 1.  Anemia and thrombocytopenia Decreased hemoglobin and platelet levels post-chemotherapy, no bleeding risk. - Monitor hemoglobin and platelet levels.  Follow-up Chemotherapy followed by surgery planned. Side effects expected to lessen. - Schedule next chemotherapy session for the Monday after next. - Plan surgery post-chemotherapy. - Follow up in one week to monitor blood counts and progress.      No orders of the defined types were placed in this encounter.  The patient has a good understanding of the overall plan. she agrees with it. she will call with any problems that may develop before the next visit here. Total time spent: 30 mins including face to face time and time spent for planning, charting and co-ordination of care   Tamsen Meek, MD 07/28/23

## 2023-07-30 ENCOUNTER — Other Ambulatory Visit: Payer: Self-pay | Admitting: *Deleted

## 2023-07-30 ENCOUNTER — Encounter: Payer: Self-pay | Admitting: Hematology and Oncology

## 2023-07-30 ENCOUNTER — Encounter (HOSPITAL_COMMUNITY): Payer: Self-pay

## 2023-07-30 ENCOUNTER — Other Ambulatory Visit (HOSPITAL_COMMUNITY): Payer: Self-pay

## 2023-07-30 MED ORDER — NYSTATIN 100000 UNIT/ML MT SUSP
OROMUCOSAL | 2 refills | Status: DC
  Filled 2023-07-30: qty 240, 12d supply, fill #0

## 2023-07-30 MED ORDER — LIDOCAINE VISCOUS HCL 2 % MT SOLN
Freq: Four times a day (QID) | OROMUCOSAL | 2 refills | Status: DC | PRN
  Filled 2023-07-30: qty 240, 12d supply, fill #0

## 2023-07-30 NOTE — Progress Notes (Signed)
 Received call from pt with complaint of mouth red oral sores on top of oral thrush.  RN reviewed with MD and verbal orders received for pt to be prescribed magic mouthwash.  Prescription sen to pharmacy on file, pt educated and verbalized understanding.

## 2023-08-01 MED FILL — Fosaprepitant Dimeglumine For IV Infusion 150 MG (Base Eq): INTRAVENOUS | Qty: 5 | Status: AC

## 2023-08-04 ENCOUNTER — Inpatient Hospital Stay: Payer: Managed Care, Other (non HMO)

## 2023-08-04 ENCOUNTER — Inpatient Hospital Stay (HOSPITAL_BASED_OUTPATIENT_CLINIC_OR_DEPARTMENT_OTHER): Payer: Managed Care, Other (non HMO) | Admitting: Hematology and Oncology

## 2023-08-04 VITALS — BP 128/57 | HR 88 | Temp 97.6°F | Resp 17 | Ht 64.0 in | Wt 183.3 lb

## 2023-08-04 DIAGNOSIS — Z170 Estrogen receptor positive status [ER+]: Secondary | ICD-10-CM

## 2023-08-04 DIAGNOSIS — C50812 Malignant neoplasm of overlapping sites of left female breast: Secondary | ICD-10-CM

## 2023-08-04 DIAGNOSIS — Z95828 Presence of other vascular implants and grafts: Secondary | ICD-10-CM

## 2023-08-04 LAB — CBC WITH DIFFERENTIAL (CANCER CENTER ONLY)
Abs Immature Granulocytes: 3.03 10*3/uL — ABNORMAL HIGH (ref 0.00–0.07)
Basophils Absolute: 0.1 10*3/uL (ref 0.0–0.1)
Basophils Relative: 1 %
Eosinophils Absolute: 0 10*3/uL (ref 0.0–0.5)
Eosinophils Relative: 0 %
HCT: 36.5 % (ref 36.0–46.0)
Hemoglobin: 12.5 g/dL (ref 12.0–15.0)
Immature Granulocytes: 17 %
Lymphocytes Relative: 12 %
Lymphs Abs: 2.1 10*3/uL (ref 0.7–4.0)
MCH: 30.9 pg (ref 26.0–34.0)
MCHC: 34.2 g/dL (ref 30.0–36.0)
MCV: 90.3 fL (ref 80.0–100.0)
Monocytes Absolute: 1.7 10*3/uL — ABNORMAL HIGH (ref 0.1–1.0)
Monocytes Relative: 9 %
Neutro Abs: 10.9 10*3/uL — ABNORMAL HIGH (ref 1.7–7.7)
Neutrophils Relative %: 61 %
Platelet Count: 470 10*3/uL — ABNORMAL HIGH (ref 150–400)
RBC: 4.04 MIL/uL (ref 3.87–5.11)
RDW: 11.6 % (ref 11.5–15.5)
Smear Review: NORMAL
WBC Count: 17.8 10*3/uL — ABNORMAL HIGH (ref 4.0–10.5)
nRBC: 0 % (ref 0.0–0.2)

## 2023-08-04 LAB — CMP (CANCER CENTER ONLY)
ALT: 22 U/L (ref 0–44)
AST: 17 U/L (ref 15–41)
Albumin: 4 g/dL (ref 3.5–5.0)
Alkaline Phosphatase: 102 U/L (ref 38–126)
Anion gap: 6 (ref 5–15)
BUN: 9 mg/dL (ref 6–20)
CO2: 26 mmol/L (ref 22–32)
Calcium: 8.9 mg/dL (ref 8.9–10.3)
Chloride: 109 mmol/L (ref 98–111)
Creatinine: 0.76 mg/dL (ref 0.44–1.00)
GFR, Estimated: >60 mL/min (ref >60)
Glucose, Bld: 113 mg/dL — ABNORMAL HIGH (ref 70–99)
Potassium: 3.8 mmol/L (ref 3.5–5.1)
Sodium: 141 mmol/L (ref 135–145)
Total Bilirubin: 0.2 mg/dL (ref 0.0–1.2)
Total Protein: 6.9 g/dL (ref 6.5–8.1)

## 2023-08-04 MED ORDER — SODIUM CHLORIDE 0.9 % IV SOLN
150.0000 mg | Freq: Once | INTRAVENOUS | Status: AC
  Administered 2023-08-04: 150 mg via INTRAVENOUS
  Filled 2023-08-04: qty 150

## 2023-08-04 MED ORDER — SODIUM CHLORIDE 0.9% FLUSH
10.0000 mL | INTRAVENOUS | Status: DC | PRN
  Administered 2023-08-04: 10 mL

## 2023-08-04 MED ORDER — SODIUM CHLORIDE 0.9 % IV SOLN
500.0000 mg/m2 | Freq: Once | INTRAVENOUS | Status: AC
  Administered 2023-08-04: 1000 mg via INTRAVENOUS
  Filled 2023-08-04: qty 50

## 2023-08-04 MED ORDER — PALONOSETRON HCL INJECTION 0.25 MG/5ML
0.2500 mg | Freq: Once | INTRAVENOUS | Status: AC
  Administered 2023-08-04: 0.25 mg via INTRAVENOUS
  Filled 2023-08-04: qty 5

## 2023-08-04 MED ORDER — SODIUM CHLORIDE 0.9 % IV SOLN: INTRAVENOUS | Status: DC

## 2023-08-04 MED ORDER — HEPARIN SOD (PORK) LOCK FLUSH 100 UNIT/ML IV SOLN
500.0000 [IU] | Freq: Once | INTRAVENOUS | Status: AC | PRN
  Administered 2023-08-04: 500 [IU]

## 2023-08-04 MED ORDER — DOXORUBICIN HCL CHEMO IV INJECTION 2 MG/ML
50.0000 mg/m2 | Freq: Once | INTRAVENOUS | Status: AC
  Administered 2023-08-04: 98 mg via INTRAVENOUS
  Filled 2023-08-04: qty 49

## 2023-08-04 MED ORDER — DEXAMETHASONE SODIUM PHOSPHATE 10 MG/ML IJ SOLN
10.0000 mg | Freq: Once | INTRAMUSCULAR | Status: AC
  Administered 2023-08-04: 10 mg via INTRAVENOUS
  Filled 2023-08-04: qty 1

## 2023-08-04 MED ORDER — SODIUM CHLORIDE 0.9% FLUSH
10.0000 mL | Freq: Once | INTRAVENOUS | Status: AC
  Administered 2023-08-04: 10 mL

## 2023-08-04 NOTE — Progress Notes (Signed)
 Patient Care Team: Excell Seltzer, MD as PCP - General Donnelly Angelica, RN as Oncology Nurse Navigator Pershing Proud, RN as Oncology Nurse Navigator Serena Croissant, MD as Consulting Physician (Hematology and Oncology) Harriette Bouillon, MD as Consulting Physician (General Surgery) Dorothy Puffer, MD as Consulting Physician (Radiation Oncology)  DIAGNOSIS:  Encounter Diagnosis  Name Primary?   Malignant neoplasm of overlapping sites of left breast in female, estrogen receptor positive (HCC) Yes    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 (HCC)   06/10/2023 Cancer Staging   Staging form: Breast, AJCC 8th Edition - Clinical: cT4, cN0, cM0, GX, ER+, PR+, HER2- - Signed by Serena Croissant, MD on 06/10/2023 Stage prefix: Initial diagnosis Histologic grading system: 3 grade system   07/21/2023 -  Chemotherapy   Patient is on Treatment Plan : BREAST DOSE DENSE AC q14d / PACLitaxel q7d       CHIEF COMPLIANT: Cycle 2 Adriamycin and Cytoxan  HISTORY OF PRESENT ILLNESS:  History of Present Illness The patient, with a history of cancer, presents with fluctuating energy levels and mouth sores. She reports feeling tired after attempting to work last week, and has been experiencing interrupted sleep. She has been managing her mouth sores with a prescribed mouthwash, which has been effective in numbing the pain. She has also been applying the mouthwash directly to the sores with a Q-tip to avoid numbing her entire mouth. Her taste has improved, allowing her to eat more normally. She has been cautious about socializing due to her low white blood cell count, but is eager to be more active as her counts improve. She has been managing her medications, but has questions about her current regimen.     ALLERGIES:  is allergic to  chlorhexidine.  MEDICATIONS:  Current Outpatient Medications  Medication Sig Dispense Refill   anastrozole (ARIMIDEX) 1 MG tablet Take 1 tablet (1 mg total) by mouth daily. (Patient not taking: Reported on 07/28/2023) 90 tablet 3   dexamethasone (DECADRON) 4 MG tablet Take 1 tablet day after chemo and 1 tablet 2 days after chemo with food (Patient not taking: Reported on 07/28/2023) 8 tablet 0   fluconazole (DIFLUCAN) 100 MG tablet Take 1 tablet (100 mg total) by mouth daily. 15 tablet 0   gabapentin (NEURONTIN) 300 MG capsule Take 300 mg by mouth daily as needed. (Patient not taking: Reported on 06/11/2023)     lidocaine-prilocaine (EMLA) cream Apply to affected area once (Patient not taking: Reported on 07/28/2023) 30 g 3   magic mouthwash (nystatin, lidocaine, diphenhydrAMINE) suspension Swish 5 ml around mouth 4 times a day as needed for mouth pain. 240 mL 2   naproxen (NAPROSYN) 500 MG tablet Take 500 mg by mouth 2 (two) times daily as needed. (Patient not taking: Reported on 07/28/2023)     ondansetron (ZOFRAN) 8 MG tablet Take 1 tab (8 mg) by mouth every 8 hrs as needed for nausea/vomiting. Start third day after doxorubicin/cyclophosphamide chemotherapy. (Patient not taking: Reported on 07/28/2023) 30 tablet 1   pantoprazole (PROTONIX) 40 MG tablet Take 1 tablet (40 mg total) by mouth daily. 30 tablet 1   prochlorperazine (COMPAZINE) 10 MG tablet Take 1 tablet (10 mg total) by mouth every 6 (six) hours as needed for nausea or vomiting. (Patient not taking: Reported on 07/28/2023) 30 tablet 1   No current  facility-administered medications for this visit.    PHYSICAL EXAMINATION: ECOG PERFORMANCE STATUS: 1 - Symptomatic but completely ambulatory  Vitals:   08/04/23 1047  BP: (!) 128/57  Pulse: 88  Resp: 17  Temp: 97.6 F (36.4 C)  SpO2: 99%   Filed Weights   08/04/23 1047  Weight: 183 lb 4.8 oz (83.1 kg)      LABORATORY DATA:  I have reviewed the data as listed    Latest Ref Rng  & Units 07/28/2023    3:08 PM 07/21/2023    8:15 AM 12/17/2022    7:44 AM  CMP  Glucose 70 - 99 mg/dL 440  96  90   BUN 6 - 20 mg/dL 19  12  12    Creatinine 0.44 - 1.00 mg/dL 3.47  4.25  9.56   Sodium 135 - 145 mmol/L 135  140  140   Potassium 3.5 - 5.1 mmol/L 3.8  3.7  3.7   Chloride 98 - 111 mmol/L 102  107  106   CO2 22 - 32 mmol/L 25  27  26    Calcium 8.9 - 10.3 mg/dL 8.7  9.1  9.4   Total Protein 6.5 - 8.1 g/dL 6.6  6.7  7.0   Total Bilirubin 0.0 - 1.2 mg/dL 0.6  0.5  0.4   Alkaline Phos 38 - 126 U/L 103  87  81   AST 15 - 41 U/L 14  17  22    ALT 0 - 44 U/L 32  22  27     Lab Results  Component Value Date   WBC 17.8 (H) 08/04/2023   HGB 12.5 08/04/2023   HCT 36.5 08/04/2023   MCV 90.3 08/04/2023   PLT 470 (H) 08/04/2023   NEUTROABS PENDING 08/04/2023    ASSESSMENT & PLAN:  Malignant neoplasm of overlapping sites of left female breast (HCC) 05/23/2023: Left nipple punch biopsy: Infiltrating carcinoma consistent with breast origin (CK7 GATA3 and ER positive) ER 30%, PR 20%, HER2 1+ (send T4, N0, M0, stage IIIb) 06/02/2023: Breast MRI: 2.8 cm biopsy-proven malignancy left nipple retroareolar left breast, 5 cm enhancement UOQ left breast posterior superior to the biopsy-proven malignancy, highly suspicious left breast enhancement spanning 9.8 cm. 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  Luisa Hart Adjuvant radiation Adjuvant antiestrogen therapy ------------------------------------------------------------------------------------------------------------------------------------ Current treatment: Neoadjuvant dose since Adriamycin Cytoxan cycle  2 07/09/2023: Echo: EF 60 to 65% Chemo toxicities: Neutropenia: We reduced the dosage of cycle 2. Gastritis: Sent a prescription for Protonix. Oral thrush: Resolved with Diflucan and Magic mouthwash   Her biggest complaint has been this epigastric pain which is probably due to gastritis.  This is being managed with Protonix Return to clinic in 1 week for cycle 2     No orders of the defined types were placed in this encounter.  The patient has a good understanding of the overall plan. she agrees with  it. she will call with any problems that may develop before the next visit here. Total time spent: 30 mins including face to face time and time spent for planning, charting and co-ordination of care   Tamsen Meek, MD 08/04/23

## 2023-08-04 NOTE — Assessment & Plan Note (Signed)
 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 Luisa Hart Adjuvant radiation Adjuvant antiestrogen therapy ------------------------------------------------------------------------------------------------------------------------------------ Current treatment: Neoadjuvant dose since Adriamycin Cytoxan cycle  2 07/09/2023: Echo: EF 60 to 65% Chemo toxicities: Neutropenia: We reduced the dosage of cycle 2. Gastritis: Sent a prescription for Protonix. Oral thrush: Sent a prescription for Diflucan   Her biggest complaint has been this epigastric pain which is probably due to gastritis. Return to clinic in 1 week for cycle 2

## 2023-08-04 NOTE — Patient Instructions (Signed)
 [facility] - A DEPT OF [facility]  Discharge Instructions: Thank you for choosing [facility] to provide your oncology and hematology care.   If you have a lab appointment with the Cancer Center, please go directly to the Cancer Center and check in at the registration area.   Wear comfortable clothing and clothing appropriate for easy access to any Portacath or PICC line.   We strive to give you quality time with your provider. You may need to reschedule your appointment if you arrive late (15 or more minutes).  Arriving late affects you and other patients whose appointments are after yours.  Also, if you miss three or more appointments without notifying the office, you may be dismissed from the clinic at the provider's discretion.      For prescription refill requests, have your pharmacy contact our office and allow 72 hours for refills to be completed.    Today you received the following chemotherapy and/or immunotherapy agents :  Adriamycin, Cyclophosphamide,   To help prevent nausea and vomiting after your treatment, we encourage you to take your nausea medication as directed.  BELOW ARE SYMPTOMS THAT SHOULD BE REPORTED IMMEDIATELY: *FEVER GREATER THAN 100.4 F (38 C) OR HIGHER *CHILLS OR SWEATING *NAUSEA AND VOMITING THAT IS NOT CONTROLLED WITH YOUR NAUSEA MEDICATION *UNUSUAL SHORTNESS OF BREATH *UNUSUAL BRUISING OR BLEEDING *URINARY PROBLEMS (pain or burning when urinating, or frequent urination) *BOWEL PROBLEMS (unusual diarrhea, constipation, pain near the anus) TENDERNESS IN MOUTH AND THROAT WITH OR WITHOUT PRESENCE OF ULCERS (sore throat, sores in mouth, or a toothache) UNUSUAL RASH, SWELLING OR PAIN  UNUSUAL VAGINAL DISCHARGE OR ITCHING   Items with * indicate a potential emergency and should be followed up as soon as possible or go to the Emergency Department if any problems should occur.  Please show the CHEMOTHERAPY ALERT  CARD or IMMUNOTHERAPY ALERT CARD at check-in to the Emergency Department and triage nurse.  Should you have questions after your visit or need to cancel or reschedule your appointment, please contact [facility] - A DEPT OF [facility]  Dept: [phone]  and follow the prompts.  Office hours are 8:00 a.m. to 4:30 p.m. Monday - Friday. Please note that voicemails left after 4:00 p.m. may not be returned until the following business day.  We are closed weekends and major holidays. You have access to a nurse at all times for urgent questions. Please call the main number to the clinic Dept: [phone] and follow the prompts.   For any non-urgent questions, you may also contact your provider using MyChart. We now offer e-Visits for anyone 73 and older to request care online for non-urgent symptoms. For details visit [email or web address].   Also download the MyChart app! Go to the app store, search "MyChart", open the app, select [facility], and log in with your MyChart username and password.

## 2023-08-06 ENCOUNTER — Telehealth: Payer: Self-pay

## 2023-08-06 ENCOUNTER — Inpatient Hospital Stay: Payer: Managed Care, Other (non HMO)

## 2023-08-06 VITALS — BP 131/75 | HR 73 | Temp 97.7°F | Resp 17

## 2023-08-06 DIAGNOSIS — C50812 Malignant neoplasm of overlapping sites of left female breast: Secondary | ICD-10-CM | POA: Diagnosis not present

## 2023-08-06 DIAGNOSIS — Z170 Estrogen receptor positive status [ER+]: Secondary | ICD-10-CM

## 2023-08-06 MED ORDER — PEGFILGRASTIM-CBQV 6 MG/0.6ML ~~LOC~~ SOSY
6.0000 mg | PREFILLED_SYRINGE | Freq: Once | SUBCUTANEOUS | Status: AC
Start: 2023-08-06 — End: 2023-08-06
  Administered 2023-08-06: 6 mg via SUBCUTANEOUS
  Filled 2023-08-06: qty 0.6

## 2023-08-06 NOTE — Telephone Encounter (Signed)
 Notified Patient of completion of FMLA forms for Spouse. Fax transmission confirmation received. Copy of forms placed for pick-up as requested. No other needs or concerns noted at this time.

## 2023-08-15 MED FILL — Fosaprepitant Dimeglumine For IV Infusion 150 MG (Base Eq): INTRAVENOUS | Qty: 5 | Status: AC

## 2023-08-18 ENCOUNTER — Inpatient Hospital Stay: Payer: Managed Care, Other (non HMO)

## 2023-08-18 ENCOUNTER — Inpatient Hospital Stay (HOSPITAL_BASED_OUTPATIENT_CLINIC_OR_DEPARTMENT_OTHER): Payer: Managed Care, Other (non HMO) | Admitting: Hematology and Oncology

## 2023-08-18 VITALS — Temp 98.2°F | Resp 17 | Ht 64.0 in | Wt 184.7 lb

## 2023-08-18 DIAGNOSIS — Z170 Estrogen receptor positive status [ER+]: Secondary | ICD-10-CM | POA: Diagnosis not present

## 2023-08-18 DIAGNOSIS — C50812 Malignant neoplasm of overlapping sites of left female breast: Secondary | ICD-10-CM

## 2023-08-18 DIAGNOSIS — Z95828 Presence of other vascular implants and grafts: Secondary | ICD-10-CM

## 2023-08-18 LAB — CMP (CANCER CENTER ONLY)
ALT: 27 U/L (ref 0–44)
AST: 19 U/L (ref 15–41)
Albumin: 4 g/dL (ref 3.5–5.0)
Alkaline Phosphatase: 108 U/L (ref 38–126)
Anion gap: 5 (ref 5–15)
BUN: 11 mg/dL (ref 6–20)
CO2: 28 mmol/L (ref 22–32)
Calcium: 9.1 mg/dL (ref 8.9–10.3)
Chloride: 109 mmol/L (ref 98–111)
Creatinine: 0.8 mg/dL (ref 0.44–1.00)
GFR, Estimated: >60 mL/min (ref >60)
Glucose, Bld: 111 mg/dL — ABNORMAL HIGH (ref 70–99)
Potassium: 3.8 mmol/L (ref 3.5–5.1)
Sodium: 142 mmol/L (ref 135–145)
Total Bilirubin: 0.2 mg/dL (ref 0.0–1.2)
Total Protein: 6.4 g/dL — ABNORMAL LOW (ref 6.5–8.1)

## 2023-08-18 LAB — CBC WITH DIFFERENTIAL (CANCER CENTER ONLY)
Abs Immature Granulocytes: 3.67 10*3/uL — ABNORMAL HIGH (ref 0.00–0.07)
Basophils Absolute: 0.1 10*3/uL (ref 0.0–0.1)
Basophils Relative: 0 %
Eosinophils Absolute: 0 10*3/uL (ref 0.0–0.5)
Eosinophils Relative: 0 %
HCT: 34.3 % — ABNORMAL LOW (ref 36.0–46.0)
Hemoglobin: 11.7 g/dL — ABNORMAL LOW (ref 12.0–15.0)
Immature Granulocytes: 17 %
Lymphocytes Relative: 7 %
Lymphs Abs: 1.6 10*3/uL (ref 0.7–4.0)
MCH: 30.8 pg (ref 26.0–34.0)
MCHC: 34.1 g/dL (ref 30.0–36.0)
MCV: 90.3 fL (ref 80.0–100.0)
Monocytes Absolute: 1.9 10*3/uL — ABNORMAL HIGH (ref 0.1–1.0)
Monocytes Relative: 9 %
Neutro Abs: 14 10*3/uL — ABNORMAL HIGH (ref 1.7–7.7)
Neutrophils Relative %: 67 %
Platelet Count: 244 10*3/uL (ref 150–400)
RBC: 3.8 MIL/uL — ABNORMAL LOW (ref 3.87–5.11)
RDW: 12.6 % (ref 11.5–15.5)
Smear Review: NORMAL
WBC Count: 21.2 10*3/uL — ABNORMAL HIGH (ref 4.0–10.5)
nRBC: 0.3 % — ABNORMAL HIGH (ref 0.0–0.2)

## 2023-08-18 MED ORDER — DEXAMETHASONE SODIUM PHOSPHATE 10 MG/ML IJ SOLN
10.0000 mg | Freq: Once | INTRAMUSCULAR | Status: AC
  Administered 2023-08-18: 10 mg via INTRAVENOUS
  Filled 2023-08-18: qty 1

## 2023-08-18 MED ORDER — SODIUM CHLORIDE 0.9 % IV SOLN
150.0000 mg | Freq: Once | INTRAVENOUS | Status: AC
  Administered 2023-08-18: 150 mg via INTRAVENOUS
  Filled 2023-08-18: qty 150

## 2023-08-18 MED ORDER — DOXORUBICIN HCL CHEMO IV INJECTION 2 MG/ML
50.0000 mg/m2 | Freq: Once | INTRAVENOUS | Status: AC
  Administered 2023-08-18: 98 mg via INTRAVENOUS
  Filled 2023-08-18: qty 49

## 2023-08-18 MED ORDER — PALONOSETRON HCL INJECTION 0.25 MG/5ML
0.2500 mg | Freq: Once | INTRAVENOUS | Status: AC
  Administered 2023-08-18: 0.25 mg via INTRAVENOUS
  Filled 2023-08-18: qty 5

## 2023-08-18 MED ORDER — SODIUM CHLORIDE 0.9 % IV SOLN
500.0000 mg/m2 | Freq: Once | INTRAVENOUS | Status: AC
  Administered 2023-08-18: 1000 mg via INTRAVENOUS
  Filled 2023-08-18: qty 50

## 2023-08-18 MED ORDER — SODIUM CHLORIDE 0.9 % IV SOLN: INTRAVENOUS | Status: DC

## 2023-08-18 MED ORDER — SODIUM CHLORIDE 0.9% FLUSH
10.0000 mL | Freq: Once | INTRAVENOUS | Status: AC
  Administered 2023-08-18: 10 mL

## 2023-08-18 NOTE — Patient Instructions (Signed)
 [facility] - A DEPT OF [facility]  Discharge Instructions: Thank you for choosing [facility] to provide your oncology and hematology care.   If you have a lab appointment with the Cancer Center, please go directly to the Cancer Center and check in at the registration area.   Wear comfortable clothing and clothing appropriate for easy access to any Portacath or PICC line.   We strive to give you quality time with your provider. You may need to reschedule your appointment if you arrive late (15 or more minutes).  Arriving late affects you and other patients whose appointments are after yours.  Also, if you miss three or more appointments without notifying the office, you may be dismissed from the clinic at the provider's discretion.      For prescription refill requests, have your pharmacy contact our office and allow 72 hours for refills to be completed.    Today you received the following chemotherapy and/or immunotherapy agents :  Adriamycin, Cyclophosphamide,   To help prevent nausea and vomiting after your treatment, we encourage you to take your nausea medication as directed.  BELOW ARE SYMPTOMS THAT SHOULD BE REPORTED IMMEDIATELY: *FEVER GREATER THAN 100.4 F (38 C) OR HIGHER *CHILLS OR SWEATING *NAUSEA AND VOMITING THAT IS NOT CONTROLLED WITH YOUR NAUSEA MEDICATION *UNUSUAL SHORTNESS OF BREATH *UNUSUAL BRUISING OR BLEEDING *URINARY PROBLEMS (pain or burning when urinating, or frequent urination) *BOWEL PROBLEMS (unusual diarrhea, constipation, pain near the anus) TENDERNESS IN MOUTH AND THROAT WITH OR WITHOUT PRESENCE OF ULCERS (sore throat, sores in mouth, or a toothache) UNUSUAL RASH, SWELLING OR PAIN  UNUSUAL VAGINAL DISCHARGE OR ITCHING   Items with * indicate a potential emergency and should be followed up as soon as possible or go to the Emergency Department if any problems should occur.  Please show the CHEMOTHERAPY ALERT  CARD or IMMUNOTHERAPY ALERT CARD at check-in to the Emergency Department and triage nurse.  Should you have questions after your visit or need to cancel or reschedule your appointment, please contact [facility] - A DEPT OF [facility]  Dept: [phone]  and follow the prompts.  Office hours are 8:00 a.m. to 4:30 p.m. Monday - Friday. Please note that voicemails left after 4:00 p.m. may not be returned until the following business day.  We are closed weekends and major holidays. You have access to a nurse at all times for urgent questions. Please call the main number to the clinic Dept: [phone] and follow the prompts.   For any non-urgent questions, you may also contact your provider using MyChart. We now offer e-Visits for anyone 73 and older to request care online for non-urgent symptoms. For details visit [email or web address].   Also download the MyChart app! Go to the app store, search "MyChart", open the app, select [facility], and log in with your MyChart username and password.

## 2023-08-18 NOTE — Assessment & Plan Note (Signed)
 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 06/18/2023: Left breast biopsy 2:30 position: Grade 2 IDC with DCIS ER 75%, PR 75%, Ki67 20%, HER2 1+ negative   Treatment plan: Biopsy on the 5 cm of enhancement: Grade 2 IDC, ER 75%, PR 75%, Ki 67: 20%, HER 2 1+ Neg CT scans and bone scan: 06/18/23:Left breast mass 2.2 cm, prom axillary LN, no distant mets, non specific pancreatic head/ uncinate mass 07/02/2023: Oncotype DX: 30 (distant recurrence at 10 years: 19%) Neoadjuvant chemotherapy with dose dense Adriamycin and Cytoxan x 4 followed by Taxol x 12 started 07/21/2023 Mastectomy versus lumpectomy (patient wishes to do bilateral breast reductions if possible): Will be determined by Dr. Luisa Hart Adjuvant radiation Adjuvant antiestrogen therapy ------------------------------------------------------------------------------------------------------------------------------------ Current treatment: Neoadjuvant dose since Adriamycin Cytoxan cycle 3 07/09/2023: Echo: EF 60 to 65% Chemo toxicities: Neutropenia: We reduced the dosage of cycle 2. Gastritis: Sent a prescription for Protonix. Oral thrush: Resolved with Diflucan and Magic mouthwash   Her biggest complaint has been this epigastric pain which is probably due to gastritis.  This is being managed with Protonix Return to clinic in 2 weeks for cycle 4

## 2023-08-18 NOTE — Progress Notes (Signed)
 Patient Care Team: Excell Seltzer, MD as PCP - General Donnelly Angelica, RN as Oncology Nurse Navigator Pershing Proud, RN as Oncology Nurse Navigator Serena Croissant, MD as Consulting Physician (Hematology and Oncology) Harriette Bouillon, MD as Consulting Physician (General Surgery) Dorothy Puffer, MD as Consulting Physician (Radiation Oncology)  DIAGNOSIS:  Encounter Diagnosis  Name Primary?   Malignant neoplasm of overlapping sites of left breast in female, estrogen receptor positive (HCC) Yes    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 (HCC)   06/10/2023 Cancer Staging   Staging form: Breast, AJCC 8th Edition - Clinical: cT4, cN0, cM0, GX, ER+, PR+, HER2- - Signed by Serena Croissant, MD on 06/10/2023 Stage prefix: Initial diagnosis Histologic grading system: 3 grade system   07/21/2023 -  Chemotherapy   Patient is on Treatment Plan : BREAST DOSE DENSE AC q14d / PACLitaxel q7d       CHIEF COMPLIANT: Cycle 3 Adriamycin and Cytoxan  HISTORY OF PRESENT ILLNESS:   History of Present Illness Ivery, a patient undergoing chemotherapy for breast cancer, reports tolerating the treatment well. She experienced hair loss, which she managed with a positive attitude, even creating a mohawk before shaving the rest off. She has not experienced significant nausea, fatigue, bowel issues, or changes in taste or appetite. She did note a one-time occurrence of bleeding from the nipple where the cancer is located, which caused some concern. She managed this by applying a band-aid and has not experienced further bleeding.  In addition to the chemotherapy, she is also taking heartburn medication and a stool softener, which she switched due to inefficacy of the initial brand. She reports eating better and overall, she describes this round of  chemotherapy as "easy peasy." She expresses some concern about the next round of chemotherapy, based on rumors she heard about it being harder if the first round goes well.     ALLERGIES:  is allergic to chlorhexidine.  MEDICATIONS:  Current Outpatient Medications  Medication Sig Dispense Refill   dexamethasone (DECADRON) 4 MG tablet Take 1 tablet day after chemo and 1 tablet 2 days after chemo with food (Patient not taking: Reported on 07/28/2023) 8 tablet 0   lidocaine-prilocaine (EMLA) cream Apply to affected area once (Patient not taking: Reported on 07/28/2023) 30 g 3   magic mouthwash (nystatin, lidocaine, diphenhydrAMINE) suspension Swish 5 ml around mouth 4 times a day as needed for mouth pain. 240 mL 2   naproxen (NAPROSYN) 500 MG tablet Take 500 mg by mouth 2 (two) times daily as needed. (Patient not taking: Reported on 07/28/2023)     ondansetron (ZOFRAN) 8 MG tablet Take 1 tab (8 mg) by mouth every 8 hrs as needed for nausea/vomiting. Start third day after doxorubicin/cyclophosphamide chemotherapy. (Patient not taking: Reported on 07/28/2023) 30 tablet 1   pantoprazole (PROTONIX) 40 MG tablet Take 1 tablet (40 mg total) by mouth daily. 30 tablet 1   prochlorperazine (COMPAZINE) 10 MG tablet Take 1 tablet (10 mg total) by mouth every 6 (six) hours as needed for nausea or vomiting. (Patient not taking: Reported on 07/28/2023) 30 tablet 1   No current facility-administered medications for this visit.   Facility-Administered Medications Ordered in Other Visits  Medication Dose Route Frequency Provider Last Rate Last Admin   0.9 %  sodium chloride infusion   Intravenous Continuous Serena Croissant,  MD   Stopped at 08/18/23 1150    PHYSICAL EXAMINATION: ECOG PERFORMANCE STATUS: 1 - Symptomatic but completely ambulatory  Vitals:   08/18/23 0957  Resp: 17  Temp: 98.2 F (36.8 C)  SpO2: 100%   Filed Weights   08/18/23 0957  Weight: 184 lb 11.2 oz (83.8 kg)     LABORATORY DATA:  I  have reviewed the data as listed    Latest Ref Rng & Units 08/18/2023    9:24 AM 08/04/2023   10:08 AM 07/28/2023    3:08 PM  CMP  Glucose 70 - 99 mg/dL 161  096  045   BUN 6 - 20 mg/dL 11  9  19    Creatinine 0.44 - 1.00 mg/dL 4.09  8.11  9.14   Sodium 135 - 145 mmol/L 142  141  135   Potassium 3.5 - 5.1 mmol/L 3.8  3.8  3.8   Chloride 98 - 111 mmol/L 109  109  102   CO2 22 - 32 mmol/L 28  26  25    Calcium 8.9 - 10.3 mg/dL 9.1  8.9  8.7   Total Protein 6.5 - 8.1 g/dL 6.4  6.9  6.6   Total Bilirubin 0.0 - 1.2 mg/dL 0.2  0.2  0.6   Alkaline Phos 38 - 126 U/L 108  102  103   AST 15 - 41 U/L 19  17  14    ALT 0 - 44 U/L 27  22  32     Lab Results  Component Value Date   WBC 21.2 (H) 08/18/2023   HGB 11.7 (L) 08/18/2023   HCT 34.3 (L) 08/18/2023   MCV 90.3 08/18/2023   PLT 244 08/18/2023   NEUTROABS 14.0 (H) 08/18/2023    ASSESSMENT & PLAN:  Malignant neoplasm of overlapping sites of left female breast (HCC) 05/23/2023: Left nipple punch biopsy: Infiltrating carcinoma consistent with breast origin (CK7 GATA3 and ER positive) ER 30%, PR 20%, HER2 1+ (send T4, N0, M0, stage IIIb) 06/02/2023: Breast MRI: 2.8 cm biopsy-proven malignancy left nipple retroareolar left breast, 5 cm enhancement UOQ left breast posterior superior to the biopsy-proven malignancy, highly suspicious left breast enhancement spanning 9.8 cm. 06/18/2023: Left breast biopsy 2:30 position: Grade 2 IDC with DCIS ER 75%, PR 75%, Ki67 20%, HER2 1+ negative   Treatment plan: Biopsy on the 5 cm of enhancement: Grade 2 IDC, ER 75%, PR 75%, Ki 67: 20%, HER 2 1+ Neg CT scans and bone scan: 06/18/23:Left breast mass 2.2 cm, prom axillary LN, no distant mets, non specific pancreatic head/ uncinate mass 07/02/2023: Oncotype DX: 30 (distant recurrence at 9 years: 19%) Neoadjuvant chemotherapy with dose dense Adriamycin and Cytoxan x 4 followed by Taxol x 12 started 07/21/2023 Mastectomy versus lumpectomy (patient wishes to do bilateral  breast reductions if possible): Will be determined by Dr. Luisa Hart Adjuvant radiation Adjuvant antiestrogen therapy ------------------------------------------------------------------------------------------------------------------------------------ Current treatment: Neoadjuvant dose since Adriamycin Cytoxan cycle 3 07/09/2023: Echo: EF 60 to 65% Chemo toxicities: Neutropenia: We reduced the dosage of cycle 2. Gastritis: Sent a prescription for Protonix. Oral thrush: Resolved with Diflucan and Magic mouthwash   Her biggest complaint has been this epigastric pain which is probably due to gastritis.  This is being managed with Protonix Return to clinic in 2 weeks for cycle 4 ------------------------------------- Assessment and Plan Assessment & Plan Malignant neoplasm of overlapping sites of left breast, estrogen receptor positive Undergoing chemotherapy for estrogen receptor positive breast cancer. Current cycle is manageable with no significant worsening  of symptoms such as nausea, fatigue, or appetite changes. White blood cell count increased to 21, indicating improved immunity. Hemoglobin is 11.7, which is acceptable at this stage. Experienced a single episode of nipple bleeding, likely due to tumor shrinkage and changes in breast tissue. Bleeding is not recurrent and not a cause for concern. Next round of chemotherapy is expected to be similar in tolerance as the current round. - Administer next chemotherapy injection in a couple of days. - Schedule follow-up appointment on April 14 for the fourth round of chemotherapy. - Plan for MRI the day after the last chemotherapy session. - Schedule surgical consultation one week after MRI to plan surgery one month post-chemotherapy.  Neutropenia White blood cell count improved significantly, indicating a positive response to treatment and reduced risk of infection.  Gastritis Managing gastritis with pantoprazole. No significant issues with  heartburn during this cycle of chemotherapy.  Constipation Experienced constipation and switched from Colace to a different stool softener, which has been effective. Reports improved dietary intake, which may also contribute to alleviating constipation.      No orders of the defined types were placed in this encounter.  The patient has a good understanding of the overall plan. she agrees with it. she will call with any problems that may develop before the next visit here. Total time spent: 30 mins including face to face time and time spent for planning, charting and co-ordination of care   Tamsen Meek, MD 08/18/23

## 2023-08-19 ENCOUNTER — Other Ambulatory Visit: Payer: Self-pay | Admitting: Hematology and Oncology

## 2023-08-20 ENCOUNTER — Inpatient Hospital Stay: Payer: Managed Care, Other (non HMO) | Attending: Hematology and Oncology

## 2023-08-20 ENCOUNTER — Telehealth: Payer: Self-pay

## 2023-08-20 VITALS — BP 123/62 | HR 80 | Temp 98.9°F | Resp 16

## 2023-08-20 DIAGNOSIS — Z170 Estrogen receptor positive status [ER+]: Secondary | ICD-10-CM | POA: Insufficient documentation

## 2023-08-20 DIAGNOSIS — Z8719 Personal history of other diseases of the digestive system: Secondary | ICD-10-CM | POA: Insufficient documentation

## 2023-08-20 DIAGNOSIS — K5903 Drug induced constipation: Secondary | ICD-10-CM | POA: Diagnosis not present

## 2023-08-20 DIAGNOSIS — Z5111 Encounter for antineoplastic chemotherapy: Secondary | ICD-10-CM | POA: Insufficient documentation

## 2023-08-20 DIAGNOSIS — Z5189 Encounter for other specified aftercare: Secondary | ICD-10-CM | POA: Diagnosis not present

## 2023-08-20 DIAGNOSIS — B370 Candidal stomatitis: Secondary | ICD-10-CM | POA: Diagnosis not present

## 2023-08-20 DIAGNOSIS — D6481 Anemia due to antineoplastic chemotherapy: Secondary | ICD-10-CM | POA: Insufficient documentation

## 2023-08-20 DIAGNOSIS — R0989 Other specified symptoms and signs involving the circulatory and respiratory systems: Secondary | ICD-10-CM | POA: Diagnosis not present

## 2023-08-20 DIAGNOSIS — J309 Allergic rhinitis, unspecified: Secondary | ICD-10-CM | POA: Insufficient documentation

## 2023-08-20 DIAGNOSIS — K2970 Gastritis, unspecified, without bleeding: Secondary | ICD-10-CM | POA: Diagnosis not present

## 2023-08-20 DIAGNOSIS — C50812 Malignant neoplasm of overlapping sites of left female breast: Secondary | ICD-10-CM | POA: Insufficient documentation

## 2023-08-20 DIAGNOSIS — Z79899 Other long term (current) drug therapy: Secondary | ICD-10-CM | POA: Diagnosis not present

## 2023-08-20 DIAGNOSIS — R059 Cough, unspecified: Secondary | ICD-10-CM | POA: Diagnosis not present

## 2023-08-20 DIAGNOSIS — K219 Gastro-esophageal reflux disease without esophagitis: Secondary | ICD-10-CM | POA: Diagnosis not present

## 2023-08-20 DIAGNOSIS — D709 Neutropenia, unspecified: Secondary | ICD-10-CM | POA: Diagnosis not present

## 2023-08-20 MED ORDER — PEGFILGRASTIM-CBQV 6 MG/0.6ML ~~LOC~~ SOSY
6.0000 mg | PREFILLED_SYRINGE | Freq: Once | SUBCUTANEOUS | Status: AC
  Administered 2023-08-20: 6 mg via SUBCUTANEOUS
  Filled 2023-08-20: qty 0.6

## 2023-08-20 NOTE — Telephone Encounter (Signed)
 W1191, ICE COMPRESS: RANDOMIZED TRIAL OF LIMB CRYOCOMPRESSION VERSUS CONTINUOUS COMPRESSION VERSUS LOW CYCLIC COMPRESSION FOR THE PREVENTION  OF TAXANE-INDUCED PERIPHERAL NEUROPATHY  Called patient to discuss S2205. She recalls our discussion and has copies of the consents. She would like to proceed with consent/enrollment. Plan to meet during her visit on 09/01/23 to sign consents/do neuro assessments. She will review consents to make sure she doesn't have questions. Encouraged her to call me if she has questions or concerns before we meet.  Margret Chance Xian Alves, RN, BSN, [facility] She  Her  [employer]ers Clinical Research Nurse [facility] Direct Dial [phone] 08/20/2023 9:43 AM

## 2023-08-29 ENCOUNTER — Telehealth: Payer: Self-pay | Admitting: Hematology and Oncology

## 2023-08-29 MED FILL — Fosaprepitant Dimeglumine For IV Infusion 150 MG (Base Eq): INTRAVENOUS | Qty: 5 | Status: AC

## 2023-08-29 NOTE — Telephone Encounter (Signed)
 Good Morning, courteous notification of patient canceling genetic referral appt Monday September 01 2023 2pm- saw in [place] canceled by patient . There is no specific reasoning to why Thank you

## 2023-09-01 ENCOUNTER — Inpatient Hospital Stay: Payer: Managed Care, Other (non HMO) | Admitting: Genetic Counselor

## 2023-09-01 ENCOUNTER — Inpatient Hospital Stay: Payer: Managed Care, Other (non HMO)

## 2023-09-01 ENCOUNTER — Inpatient Hospital Stay

## 2023-09-01 ENCOUNTER — Inpatient Hospital Stay (HOSPITAL_BASED_OUTPATIENT_CLINIC_OR_DEPARTMENT_OTHER): Payer: Managed Care, Other (non HMO) | Admitting: Hematology and Oncology

## 2023-09-01 ENCOUNTER — Encounter: Payer: Self-pay | Admitting: *Deleted

## 2023-09-01 VITALS — BP 116/58 | HR 81 | Temp 97.8°F | Resp 18 | Ht 64.0 in | Wt 184.3 lb

## 2023-09-01 VITALS — BP 122/61 | HR 82 | Resp 17

## 2023-09-01 DIAGNOSIS — Z170 Estrogen receptor positive status [ER+]: Secondary | ICD-10-CM

## 2023-09-01 DIAGNOSIS — C50812 Malignant neoplasm of overlapping sites of left female breast: Secondary | ICD-10-CM

## 2023-09-01 DIAGNOSIS — Z95828 Presence of other vascular implants and grafts: Secondary | ICD-10-CM

## 2023-09-01 LAB — CBC WITH DIFFERENTIAL (CANCER CENTER ONLY)
Abs Immature Granulocytes: 3.19 10*3/uL — ABNORMAL HIGH (ref 0.00–0.07)
Basophils Absolute: 0.1 10*3/uL (ref 0.0–0.1)
Basophils Relative: 0 %
Eosinophils Absolute: 0.1 10*3/uL (ref 0.0–0.5)
Eosinophils Relative: 0 %
HCT: 31.9 % — ABNORMAL LOW (ref 36.0–46.0)
Hemoglobin: 10.8 g/dL — ABNORMAL LOW (ref 12.0–15.0)
Immature Granulocytes: 15 %
Lymphocytes Relative: 7 %
Lymphs Abs: 1.5 10*3/uL (ref 0.7–4.0)
MCH: 31.2 pg (ref 26.0–34.0)
MCHC: 33.9 g/dL (ref 30.0–36.0)
MCV: 92.2 fL (ref 80.0–100.0)
Monocytes Absolute: 1.7 10*3/uL — ABNORMAL HIGH (ref 0.1–1.0)
Monocytes Relative: 8 %
Neutro Abs: 14.4 10*3/uL — ABNORMAL HIGH (ref 1.7–7.7)
Neutrophils Relative %: 70 %
Platelet Count: 217 10*3/uL (ref 150–400)
RBC: 3.46 MIL/uL — ABNORMAL LOW (ref 3.87–5.11)
RDW: 15 % (ref 11.5–15.5)
Smear Review: NORMAL
WBC Count: 21 10*3/uL — ABNORMAL HIGH (ref 4.0–10.5)
nRBC: 0.3 % — ABNORMAL HIGH (ref 0.0–0.2)

## 2023-09-01 LAB — CMP (CANCER CENTER ONLY)
ALT: 19 U/L (ref 0–44)
AST: 16 U/L (ref 15–41)
Albumin: 3.9 g/dL (ref 3.5–5.0)
Alkaline Phosphatase: 112 U/L (ref 38–126)
Anion gap: 5 (ref 5–15)
BUN: 11 mg/dL (ref 6–20)
CO2: 27 mmol/L (ref 22–32)
Calcium: 9.1 mg/dL (ref 8.9–10.3)
Chloride: 110 mmol/L (ref 98–111)
Creatinine: 0.7 mg/dL (ref 0.44–1.00)
GFR, Estimated: >60 mL/min (ref >60)
Glucose, Bld: 104 mg/dL — ABNORMAL HIGH (ref 70–99)
Potassium: 3.8 mmol/L (ref 3.5–5.1)
Sodium: 142 mmol/L (ref 135–145)
Total Bilirubin: 0.2 mg/dL (ref 0.0–1.2)
Total Protein: 6.2 g/dL — ABNORMAL LOW (ref 6.5–8.1)

## 2023-09-01 MED ORDER — PALONOSETRON HCL INJECTION 0.25 MG/5ML
0.2500 mg | Freq: Once | INTRAVENOUS | Status: AC
  Administered 2023-09-01: 0.25 mg via INTRAVENOUS
  Filled 2023-09-01: qty 5

## 2023-09-01 MED ORDER — DEXAMETHASONE SODIUM PHOSPHATE 10 MG/ML IJ SOLN
10.0000 mg | Freq: Once | INTRAMUSCULAR | Status: AC
Start: 2023-09-01 — End: 2023-09-01
  Administered 2023-09-01: 10 mg via INTRAVENOUS
  Filled 2023-09-01: qty 1

## 2023-09-01 MED ORDER — CYCLOPHOSPHAMIDE CHEMO INJECTION 1 GM
500.0000 mg/m2 | Freq: Once | INTRAMUSCULAR | Status: AC
  Administered 2023-09-01: 1000 mg via INTRAVENOUS
  Filled 2023-09-01: qty 50

## 2023-09-01 MED ORDER — SODIUM CHLORIDE 0.9% FLUSH
10.0000 mL | Freq: Once | INTRAVENOUS | Status: AC
  Administered 2023-09-01: 10 mL

## 2023-09-01 MED ORDER — HEPARIN SOD (PORK) LOCK FLUSH 100 UNIT/ML IV SOLN
500.0000 [IU] | Freq: Once | INTRAVENOUS | Status: AC | PRN
  Administered 2023-09-01: 500 [IU]

## 2023-09-01 MED ORDER — SODIUM CHLORIDE 0.9 % IV SOLN
150.0000 mg | Freq: Once | INTRAVENOUS | Status: AC
  Administered 2023-09-01: 150 mg via INTRAVENOUS
  Filled 2023-09-01: qty 150

## 2023-09-01 MED ORDER — DOXORUBICIN HCL CHEMO IV INJECTION 2 MG/ML
50.0000 mg/m2 | Freq: Once | INTRAVENOUS | Status: AC
  Administered 2023-09-01: 98 mg via INTRAVENOUS
  Filled 2023-09-01: qty 49

## 2023-09-01 MED ORDER — SODIUM CHLORIDE 0.9 % IV SOLN: INTRAVENOUS | Status: DC

## 2023-09-01 MED ORDER — SODIUM CHLORIDE 0.9% FLUSH
10.0000 mL | INTRAVENOUS | Status: DC | PRN
  Administered 2023-09-01: 10 mL

## 2023-09-01 NOTE — Assessment & Plan Note (Addendum)
 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 06/18/2023: Left breast biopsy 2:30 position: Grade 2 IDC with DCIS ER 75%, PR 75%, Ki67 20%, HER2 1+ negative   Treatment plan: Biopsy on the 5 cm of enhancement: Grade 2 IDC, ER 75%, PR 75%, Ki 67: 20%, HER 2 1+ Neg CT scans and bone scan: 06/18/23:Left breast mass 2.2 cm, prom axillary LN, no distant mets, non specific pancreatic head/ uncinate mass 07/02/2023: Oncotype DX: 30 (distant recurrence at 9 years: 19%) Neoadjuvant chemotherapy with dose dense Adriamycin and Cytoxan x 4 followed by Taxol x 12 started 07/21/2023 Mastectomy versus lumpectomy (patient wishes to do bilateral breast reductions if possible): Will be determined by Dr. Afton Horse Adjuvant radiation Adjuvant antiestrogen therapy ------------------------------------------------------------------------------------------------------------------------------------ Current treatment: Neoadjuvant dose since Adriamycin Cytoxan cycle 4 07/09/2023: Echo: EF 60 to 65% Chemo toxicities: Neutropenia: We reduced the dosage of cycle 2. Gastritis: Sent a prescription for Protonix. Oral thrush: Resolved with Diflucan and Magic mouthwash   Her biggest complaint has been this epigastric pain which is probably due to gastritis.  This is being managed with Protonix

## 2023-09-01 NOTE — Research (Signed)
 Z6109, ICE COMPRESS: RANDOMIZED TRIAL OF LIMB CRYOCOMPRESSION VERSUS CONTINUOUS COMPRESSION VERSUS LOW CYCLIC COMPRESSION FOR THE PREVENTION  OF TAXANE-INDUCED PERIPHERAL NEUROPATHY  Patient was provided with the informational handout "Clinical Trial S2205: What to Expect from the Study Device." All questions about the device were answered to the patient's satisfaction.  Clarified the following with the patient/provider:  Does the patient have a history of skin or limb metastases? No Has the patient previously received neurotoxic chemotherapy for any reason (e.g. taxanes, platinum agents, vinca alkaloids, or bortezomib)? No Does the patient have pre-existing clinical peripheral neuropathy from any cause? No Doest the patient have a history of: Raynaud's phenomenon? No Cold agglutinin disease? No Cryoglobulinemia? No Cryofibrinogenemia? No Post-traumatic cold dystrophy? No Peripheral arterial ischemia? No   Baseline PROs were completed prior to any other study interventions. Neuropathy assessment was completed by this RN. Plan for final eligibility check and randomization within 3 calendar days prior to initiation of chemotherapy. Reviewed clothing recommendations and provided patient information hand-out. Patient verbalized understanding of the investigational nature of this study.  Kelsey Carlson Kelsey Dudash, RN, BSN, [facility] She  Her  Hers Clinical Research Nurse [facility] Direct Dial [phone] 09/01/2023 1:07 PM

## 2023-09-01 NOTE — Research (Addendum)
 Trial Name:  S2205, ICE COMPRESS: RANDOMIZED TRIAL OF LIMB CRYOCOMPRESSION VERSUS CONTINUOUS COMPRESSION VERSUS LOW CYCLIC COMPRESSION FOR THE PREVENTION  OF TAXANE-INDUCED PERIPHERAL NEUROPATHY  Patient Kelsey Carlson was identified by Dr Gudena as a potential candidate for the above listed study.  This Clinical Research Nurse met with Kelsey Carlson, [id number] on 09/01/23 in a manner and location that ensures patient privacy to discuss participation in the above listed research study.  Patient is Unaccompanied.  Patient was previously provided with informed consent documents.  Patient has not yet read the informed consent documents and so documents were reviewed page by page today.  As outlined in the informed consent form, this Nurse and Kelsey Carlson discussed the purpose of the research study, the investigational nature of the study, study procedures and requirements for study participation, potential risks and benefits of study participation, as well as alternatives to participation.  This study is not blinded or double-blinded. The patient understands participation is voluntary and they may withdraw from study participation at any time.  Each study arm was reviewed, and randomization discussed.  Potential side effects were reviewed with patient as outlined in the consent form, and patient made aware there may be side effects not yet known. This study does not involve a placebo. Patient understands enrollment is pending full eligibility review.   Confidentiality and how the patient's information will be used as part of study participation were discussed.  Patient was informed there is not reimbursement provided for their time and effort spent on trial participation.  The patient is encouraged to discuss research study participation with their insurance provider to determine what costs they may incur as part of study participation, including research related injury.    All  questions were answered to patient's satisfaction.  The informed consent and separate HIPAA Authorization was reviewed page by page.  The patient's mental and emotional status is appropriate to provide informed consent, and the patient verbalizes an understanding of study participation.  Patient has agreed to participate in the above listed research study and has voluntarily signed the informed consent version 06/06/2023 and separate HIPAA Authorization, version 04/03/2023  on 09/01/23 at 1123 AM.  The patient was provided with a copy of the signed informed consent form and separate HIPAA Authorization for their reference.  No study specific procedures were obtained prior to the signing of the informed consent document.  Approximately 20 minutes were spent with the patient reviewing the informed consent documents.  Patient was not requested to complete a Release of Information form.  Kelsey Carlson Kelsey Staples, RN, BSN, [facility] She  Her  Hers Clinical Research Nurse [facility] Direct Dial [phone] 09/01/2023 1:10 PM

## 2023-09-01 NOTE — Progress Notes (Signed)
 Patient Care Team: Excell Seltzer, MD as PCP - General Donnelly Angelica, RN as Oncology Nurse Navigator Pershing Proud, RN as Oncology Nurse Navigator Serena Croissant, MD as Consulting Physician (Hematology and Oncology) Harriette Bouillon, MD as Consulting Physician (General Surgery) Dorothy Puffer, MD as Consulting Physician (Radiation Oncology)  DIAGNOSIS:  Encounter Diagnosis  Name Primary?   Malignant neoplasm of overlapping sites of left breast in female, estrogen receptor positive (HCC) Yes    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 (HCC)   06/10/2023 Cancer Staging   Staging form: Breast, AJCC 8th Edition - Clinical: cT4, cN0, cM0, GX, ER+, PR+, HER2- - Signed by Serena Croissant, MD on 06/10/2023 Stage prefix: Initial diagnosis Histologic grading system: 3 grade system   07/21/2023 -  Chemotherapy   Patient is on Treatment Plan : BREAST DOSE DENSE AC q14d / PACLitaxel q7d       CHIEF COMPLIANT:   HISTORY OF PRESENT ILLNESS: Discussed the use of AI scribe software for clinical note transcription with the patient, who gave verbal consent to proceed.  History of Present Illness The patient, undergoing chemotherapy for an unspecified cancer, presents for her last cycle of AC (Adriamycin and Cyclophosphamide) before starting Taxol (Paclitaxel) for twelve weeks. The patient reports a history of gastritis, which has improved, and mouth sores, which lasted for about four to five days. The patient also reports a change in taste and appetite, but still has the desire to eat. The patient has a runny nose and has developed a bruise on her nose. The patient is also on an antacid and takes it as needed. The patient is also participating in a study for neuropathy, a common side effect of Taxol.     ALLERGIES:  is allergic to  chlorhexidine.  MEDICATIONS:  Current Outpatient Medications  Medication Sig Dispense Refill   dexamethasone (DECADRON) 4 MG tablet Take 1 tablet day after chemo and 1 tablet 2 days after chemo with food (Patient not taking: Reported on 07/28/2023) 8 tablet 0   lidocaine-prilocaine (EMLA) cream Apply to affected area once (Patient not taking: Reported on 07/28/2023) 30 g 3   magic mouthwash (nystatin, lidocaine, diphenhydrAMINE) suspension Swish 5 ml around mouth 4 times a day as needed for mouth pain. 240 mL 2   naproxen (NAPROSYN) 500 MG tablet Take 500 mg by mouth 2 (two) times daily as needed. (Patient not taking: Reported on 07/28/2023)     ondansetron (ZOFRAN) 8 MG tablet Take 1 tab (8 mg) by mouth every 8 hrs as needed for nausea/vomiting. Start third day after doxorubicin/cyclophosphamide chemotherapy. (Patient not taking: Reported on 07/28/2023) 30 tablet 1   pantoprazole (PROTONIX) 40 MG tablet TAKE 1 TABLET BY MOUTH EVERY DAY 90 tablet 1   prochlorperazine (COMPAZINE) 10 MG tablet Take 1 tablet (10 mg total) by mouth every 6 (six) hours as needed for nausea or vomiting. (Patient not taking: Reported on 07/28/2023) 30 tablet 1   No current facility-administered medications for this visit.    PHYSICAL EXAMINATION: ECOG PERFORMANCE STATUS: 1 - Symptomatic but completely ambulatory  There were no vitals filed for this visit. There were no vitals filed for this visit.  Physical Exam HEENT: Tongue normal  (exam performed in the presence of a chaperone)  LABORATORY DATA:  I have reviewed the data as listed    Latest  Ref Rng & Units 08/18/2023    9:24 AM 08/04/2023   10:08 AM 07/28/2023    3:08 PM  CMP  Glucose 70 - 99 mg/dL 409  811  914   BUN 6 - 20 mg/dL 11  9  19    Creatinine 0.44 - 1.00 mg/dL 7.82  9.56  2.13   Sodium 135 - 145 mmol/L 142  141  135   Potassium 3.5 - 5.1 mmol/L 3.8  3.8  3.8   Chloride 98 - 111 mmol/L 109  109  102   CO2 22 - 32 mmol/L 28  26  25    Calcium 8.9  - 10.3 mg/dL 9.1  8.9  8.7   Total Protein 6.5 - 8.1 g/dL 6.4  6.9  6.6   Total Bilirubin 0.0 - 1.2 mg/dL 0.2  0.2  0.6   Alkaline Phos 38 - 126 U/L 108  102  103   AST 15 - 41 U/L 19  17  14    ALT 0 - 44 U/L 27  22  32     Lab Results  Component Value Date   WBC 21.2 (H) 08/18/2023   HGB 11.7 (L) 08/18/2023   HCT 34.3 (L) 08/18/2023   MCV 90.3 08/18/2023   PLT 244 08/18/2023   NEUTROABS 14.0 (H) 08/18/2023    ASSESSMENT & PLAN:  Malignant neoplasm of overlapping sites of left female breast (HCC) 05/23/2023: Left nipple punch biopsy: Infiltrating carcinoma consistent with breast origin (CK7 GATA3 and ER positive) ER 30%, PR 20%, HER2 1+ (send T4, N0, M0, stage IIIb) 06/02/2023: Breast MRI: 2.8 cm biopsy-proven malignancy left nipple retroareolar left breast, 5 cm enhancement UOQ left breast posterior superior to the biopsy-proven malignancy, highly suspicious left breast enhancement spanning 9.8 cm. 06/18/2023: Left breast biopsy 2:30 position: Grade 2 IDC with DCIS ER 75%, PR 75%, Ki67 20%, HER2 1+ negative   Treatment plan: Biopsy on the 5 cm of enhancement: Grade 2 IDC, ER 75%, PR 75%, Ki 67: 20%, HER 2 1+ Neg CT scans and bone scan: 06/18/23:Left breast mass 2.2 cm, prom axillary LN, no distant mets, non specific pancreatic head/ uncinate mass 07/02/2023: Oncotype DX: 30 (distant recurrence at 9 years: 19%) Neoadjuvant chemotherapy with dose dense Adriamycin and Cytoxan x 4 followed by Taxol x 12 started 07/21/2023 Mastectomy versus lumpectomy (patient wishes to do bilateral breast reductions if possible): Will be determined by Dr. Luisa Hart Adjuvant radiation Adjuvant antiestrogen therapy ------------------------------------------------------------------------------------------------------------------------------------ Current treatment: Neoadjuvant dose dense Adriamycin Cytoxan cycle 4 07/09/2023: Echo: EF 60 to 65% Chemo toxicities: Neutropenia: We reduced the dosage of cycle  2. Gastritis: Sent a prescription for Protonix. Oral thrush: Resolved with Diflucan and Magic mouthwash   Her biggest complaint has been this epigastric pain which is probably due to gastritis.  This is being managed with Protonix She will return back to clinic in 2 weeks for cycle 1 of Taxol She is being consented for ice compress clinical trial    No orders of the defined types were placed in this encounter.  The patient has a good understanding of the overall plan. she agrees with it. she will call with any problems that may develop before the next visit here. Total time spent: 30 mins including face to face time and time spent for planning, charting and co-ordination of care   Tamsen Meek, MD 09/01/23

## 2023-09-01 NOTE — Patient Instructions (Signed)
 [facility] - A DEPT OF [facility]  Discharge Instructions: Thank you for choosing [facility] to provide your oncology and hematology care.   If you have a lab appointment with the Cancer Center, please go directly to the Cancer Center and check in at the registration area.   Wear comfortable clothing and clothing appropriate for easy access to any Portacath or PICC line.   We strive to give you quality time with your provider. You may need to reschedule your appointment if you arrive late (15 or more minutes).  Arriving late affects you and other patients whose appointments are after yours.  Also, if you miss three or more appointments without notifying the office, you may be dismissed from the clinic at the provider's discretion.      For prescription refill requests, have your pharmacy contact our office and allow 72 hours for refills to be completed.    Today you received the following chemotherapy and/or immunotherapy agents :  Adriamycin, Cyclophosphamide,   To help prevent nausea and vomiting after your treatment, we encourage you to take your nausea medication as directed.  BELOW ARE SYMPTOMS THAT SHOULD BE REPORTED IMMEDIATELY: *FEVER GREATER THAN 100.4 F (38 C) OR HIGHER *CHILLS OR SWEATING *NAUSEA AND VOMITING THAT IS NOT CONTROLLED WITH YOUR NAUSEA MEDICATION *UNUSUAL SHORTNESS OF BREATH *UNUSUAL BRUISING OR BLEEDING *URINARY PROBLEMS (pain or burning when urinating, or frequent urination) *BOWEL PROBLEMS (unusual diarrhea, constipation, pain near the anus) TENDERNESS IN MOUTH AND THROAT WITH OR WITHOUT PRESENCE OF ULCERS (sore throat, sores in mouth, or a toothache) UNUSUAL RASH, SWELLING OR PAIN  UNUSUAL VAGINAL DISCHARGE OR ITCHING   Items with * indicate a potential emergency and should be followed up as soon as possible or go to the Emergency Department if any problems should occur.  Please show the CHEMOTHERAPY ALERT  CARD or IMMUNOTHERAPY ALERT CARD at check-in to the Emergency Department and triage nurse.  Should you have questions after your visit or need to cancel or reschedule your appointment, please contact [facility] - A DEPT OF [facility]  Dept: [phone]  and follow the prompts.  Office hours are 8:00 a.m. to 4:30 p.m. Monday - Friday. Please note that voicemails left after 4:00 p.m. may not be returned until the following business day.  We are closed weekends and major holidays. You have access to a nurse at all times for urgent questions. Please call the main number to the clinic Dept: [phone] and follow the prompts.   For any non-urgent questions, you may also contact your provider using MyChart. We now offer e-Visits for anyone 73 and older to request care online for non-urgent symptoms. For details visit [email or web address].   Also download the MyChart app! Go to the app store, search "MyChart", open the app, select [facility], and log in with your MyChart username and password.

## 2023-09-03 ENCOUNTER — Encounter: Payer: Self-pay | Admitting: Hematology and Oncology

## 2023-09-03 ENCOUNTER — Inpatient Hospital Stay: Payer: Managed Care, Other (non HMO)

## 2023-09-03 VITALS — BP 134/74 | HR 68 | Temp 98.0°F | Resp 18

## 2023-09-03 DIAGNOSIS — C50812 Malignant neoplasm of overlapping sites of left female breast: Secondary | ICD-10-CM | POA: Diagnosis not present

## 2023-09-03 DIAGNOSIS — Z170 Estrogen receptor positive status [ER+]: Secondary | ICD-10-CM

## 2023-09-03 MED ORDER — PEGFILGRASTIM-CBQV 6 MG/0.6ML ~~LOC~~ SOSY
6.0000 mg | PREFILLED_SYRINGE | Freq: Once | SUBCUTANEOUS | Status: AC
  Administered 2023-09-03: 6 mg via SUBCUTANEOUS
  Filled 2023-09-03: qty 0.6

## 2023-09-10 ENCOUNTER — Telehealth: Payer: Self-pay | Admitting: *Deleted

## 2023-09-10 NOTE — Telephone Encounter (Signed)
 Error

## 2023-09-10 NOTE — Telephone Encounter (Signed)
 Received call from pt with complaint of new non productive cough. Pt denies any nasal drainage or pressure at this time. Pt states she has taken Claritin with no allergy relief, RN educated pt on OTC Zyrtec as well as OTC mucinex  DM to help with the cough.  Pt verbalized understanding.

## 2023-09-11 ENCOUNTER — Other Ambulatory Visit: Payer: Self-pay

## 2023-09-11 DIAGNOSIS — C50812 Malignant neoplasm of overlapping sites of left female breast: Secondary | ICD-10-CM

## 2023-09-11 NOTE — Progress Notes (Signed)
Research labs ordered.

## 2023-09-12 ENCOUNTER — Telehealth: Payer: Self-pay

## 2023-09-12 ENCOUNTER — Encounter: Payer: Self-pay | Admitting: Hematology and Oncology

## 2023-09-12 DIAGNOSIS — C50812 Malignant neoplasm of overlapping sites of left female breast: Secondary | ICD-10-CM

## 2023-09-12 NOTE — Research (Signed)
 S2205, ICE COMPRESS: RANDOMIZED TRIAL OF LIMB CRYOCOMPRESSION VERSUS CONTINUOUS COMPRESSION VERSUS LOW CYCLIC COMPRESSION FOR THE PREVENTION  OF TAXANE-INDUCED PERIPHERAL NEUROPATHY    This Nurse has reviewed this patient's inclusion and exclusion criteria as a second review and confirms Alonda Weaber is eligible for study participation.  Patient may continue with enrollment.  Elfida Grinder RN, BSN, CCRP Clinical Research Nurse Lead 09/12/2023 12:08 PM

## 2023-09-12 NOTE — Research (Signed)
 S2205, ICE COMPRESS: RANDOMIZED TRIAL OF LIMB CRYOCOMPRESSION VERSUS CONTINUOUS COMPRESSION VERSUS LOW CYCLIC COMPRESSION FOR THE PREVENTION  OF TAXANE-INDUCED PERIPHERAL NEUROPATHY    This Coordinator has reviewed this patient's inclusion and exclusion criteria and confirmed Kelsey Carlson is eligible for study participation.  Patient will continue with enrollment.  Eligibility confirmed by treating investigator, who also agrees that patient should proceed with enrollment.   Patient Kelsey Carlson, was registered to the above listed study, assigned study#[id number]. Randomization is required for this study. Randomization information to follow.     Patient Kelsey Carlson is randomized to Arm 1: Crycompression.  Stratification criteria were confirmed by this clinical research Coordinator and clinical research Nurse  Rosalita Combe verified the stratification factors used for randomization.  As part of the assigned treatment arm, patient Kelsey Carlson will receive her 1st treatment. MD will be notified of patient assignment; treatment will begin Mon. 4/28.  Per study protocol, patient Kelsey Carlson will be notified of their treatment assignment. Patient Kelsey Carlson is successfully enrolled in the above study.   Kelsey Carlson, MPH  Clinical Research Coordinator

## 2023-09-12 NOTE — Telephone Encounter (Signed)
 Z3086, ICE COMPRESS: RANDOMIZED TRIAL OF LIMB CRYOCOMPRESSION VERSUS CONTINUOUS COMPRESSION VERSUS LOW CYCLIC COMPRESSION FOR THE PREVENTION  OF TAXANE-INDUCED PERIPHERAL NEUROPATHY   Called patient this morning to discuss upcoming appointment and patient confirmed she doesn't have any wounds nor sores on arms/hands legs/feet. Also discussed patient previously spoke to Doctor Lee Public or nurse about experiencing symptoms with potential allergies, cough and runny nose. Patient says does not feel better and symptoms still persist despite taking recommended otc meds. Patient says she does not have any fever like symptoms at the moment. Answered patients questions on what type of material clothing may be most convenient to wear to her appointments that she will be wrapped and on the device. Patient still plans to begin Paxman device this Monday during taxol infusion treatment.  Pinkey Brier, MPH  Clinical Research Coordinator

## 2023-09-12 NOTE — Telephone Encounter (Signed)
 M8413, ICE COMPRESS: RANDOMIZED TRIAL OF LIMB CRYOCOMPRESSION VERSUS CONTINUOUS COMPRESSION VERSUS LOW CYCLIC COMPRESSION FOR THE PREVENTION  OF TAXANE-INDUCED PERIPHERAL NEUROPATHY   Called patient and notified she was found eligible for this device study. Informed patient she was successfully enrolled and randomized to the Cryocompression Arm.  Pinkey Brier, MPH  Clinical Research Coordinator

## 2023-09-15 ENCOUNTER — Inpatient Hospital Stay (HOSPITAL_BASED_OUTPATIENT_CLINIC_OR_DEPARTMENT_OTHER): Admitting: Hematology and Oncology

## 2023-09-15 ENCOUNTER — Inpatient Hospital Stay

## 2023-09-15 ENCOUNTER — Encounter: Payer: Self-pay | Admitting: Hematology and Oncology

## 2023-09-15 VITALS — BP 115/78 | HR 100 | Temp 97.4°F | Resp 18 | Ht 64.0 in | Wt 184.0 lb

## 2023-09-15 VITALS — BP 124/79 | HR 78 | Resp 18

## 2023-09-15 DIAGNOSIS — C50812 Malignant neoplasm of overlapping sites of left female breast: Secondary | ICD-10-CM

## 2023-09-15 DIAGNOSIS — Z95828 Presence of other vascular implants and grafts: Secondary | ICD-10-CM

## 2023-09-15 DIAGNOSIS — Z170 Estrogen receptor positive status [ER+]: Secondary | ICD-10-CM | POA: Diagnosis not present

## 2023-09-15 LAB — CMP (CANCER CENTER ONLY)
ALT: 17 U/L (ref 0–44)
AST: 15 U/L (ref 15–41)
Albumin: 4 g/dL (ref 3.5–5.0)
Alkaline Phosphatase: 100 U/L (ref 38–126)
Anion gap: 6 (ref 5–15)
BUN: 5 mg/dL — ABNORMAL LOW (ref 6–20)
CO2: 27 mmol/L (ref 22–32)
Calcium: 9.1 mg/dL (ref 8.9–10.3)
Chloride: 111 mmol/L (ref 98–111)
Creatinine: 0.63 mg/dL (ref 0.44–1.00)
GFR, Estimated: >60 mL/min (ref >60)
Glucose, Bld: 102 mg/dL — ABNORMAL HIGH (ref 70–99)
Potassium: 3.5 mmol/L (ref 3.5–5.1)
Sodium: 144 mmol/L (ref 135–145)
Total Bilirubin: 0.3 mg/dL (ref 0.0–1.2)
Total Protein: 6.3 g/dL — ABNORMAL LOW (ref 6.5–8.1)

## 2023-09-15 LAB — CBC WITH DIFFERENTIAL (CANCER CENTER ONLY)
Abs Immature Granulocytes: 1.54 10*3/uL — ABNORMAL HIGH (ref 0.00–0.07)
Basophils Absolute: 0.1 10*3/uL (ref 0.0–0.1)
Basophils Relative: 0 %
Eosinophils Absolute: 0.1 10*3/uL (ref 0.0–0.5)
Eosinophils Relative: 0 %
HCT: 29.9 % — ABNORMAL LOW (ref 36.0–46.0)
Hemoglobin: 9.9 g/dL — ABNORMAL LOW (ref 12.0–15.0)
Immature Granulocytes: 11 %
Lymphocytes Relative: 8 %
Lymphs Abs: 1.2 10*3/uL (ref 0.7–4.0)
MCH: 31.3 pg (ref 26.0–34.0)
MCHC: 33.1 g/dL (ref 30.0–36.0)
MCV: 94.6 fL (ref 80.0–100.0)
Monocytes Absolute: 1.6 10*3/uL — ABNORMAL HIGH (ref 0.1–1.0)
Monocytes Relative: 11 %
Neutro Abs: 10.3 10*3/uL — ABNORMAL HIGH (ref 1.7–7.7)
Neutrophils Relative %: 70 %
Platelet Count: 202 10*3/uL (ref 150–400)
RBC: 3.16 MIL/uL — ABNORMAL LOW (ref 3.87–5.11)
RDW: 17 % — ABNORMAL HIGH (ref 11.5–15.5)
WBC Count: 14.7 10*3/uL — ABNORMAL HIGH (ref 4.0–10.5)
nRBC: 0.5 % — ABNORMAL HIGH (ref 0.0–0.2)

## 2023-09-15 LAB — RESEARCH LABS

## 2023-09-15 MED ORDER — SODIUM CHLORIDE 0.9 % IV SOLN
80.0000 mg/m2 | Freq: Once | INTRAVENOUS | Status: AC
  Administered 2023-09-15: 156 mg via INTRAVENOUS
  Filled 2023-09-15: qty 26

## 2023-09-15 MED ORDER — FAMOTIDINE IN NACL 20-0.9 MG/50ML-% IV SOLN
20.0000 mg | Freq: Once | INTRAVENOUS | Status: AC
  Administered 2023-09-15: 20 mg via INTRAVENOUS
  Filled 2023-09-15: qty 50

## 2023-09-15 MED ORDER — DIPHENHYDRAMINE HCL 50 MG/ML IJ SOLN
25.0000 mg | Freq: Once | INTRAMUSCULAR | Status: AC
Start: 2023-09-15 — End: 2023-09-15
  Administered 2023-09-15: 25 mg via INTRAVENOUS
  Filled 2023-09-15: qty 1

## 2023-09-15 MED ORDER — SODIUM CHLORIDE 0.9% FLUSH
10.0000 mL | Freq: Once | INTRAVENOUS | Status: AC
  Administered 2023-09-15: 10 mL

## 2023-09-15 MED ORDER — SODIUM CHLORIDE 0.9 % IV SOLN: INTRAVENOUS | Status: DC

## 2023-09-15 MED ORDER — DEXAMETHASONE SODIUM PHOSPHATE 10 MG/ML IJ SOLN
4.0000 mg | Freq: Once | INTRAMUSCULAR | Status: AC
  Administered 2023-09-15: 4 mg via INTRAVENOUS
  Filled 2023-09-15: qty 1

## 2023-09-15 NOTE — Progress Notes (Signed)
 Patient Care Team: Judithann Novas, MD as PCP - General Alane Hsu, RN as Oncology Nurse Navigator Auther Bo, RN as Oncology Nurse Navigator Cameron Cea, MD as Consulting Physician (Hematology and Oncology) Sim Dryer, MD as Consulting Physician (General Surgery) Johna Myers, MD as Consulting Physician (Radiation Oncology)  DIAGNOSIS:  Encounter Diagnosis  Name Primary?   Malignant neoplasm of overlapping sites of left breast in female, estrogen receptor positive (HCC) Yes    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 (HCC)   06/10/2023 Cancer Staging   Staging form: Breast, AJCC 8th Edition - Clinical: cT4, cN0, cM0, GX, ER+, PR+, HER2- - Signed by Cameron Cea, MD on 06/10/2023 Stage prefix: Initial diagnosis Histologic grading system: 3 grade system   07/21/2023 -  Chemotherapy   Patient is on Treatment Plan : BREAST DOSE DENSE AC q14d / PACLitaxel q7d       CHIEF COMPLIANT: Cycle 1 taxol  HISTORY OF PRESENT ILLNESS:   History of Present Illness The patient, with a history of allergies, presents with a persistent cough and runny nose. She initially attributed these symptoms to a cold, but a nurse suggested she was likely due to allergies. The symptoms are particularly bothersome if she does not take Zyrtec. She denies fever. She also reports a history of acid reflux, which she is managing with an over-the-counter medication.  In addition, the patient has been experiencing constipation, which she attributes to her chemotherapy treatment. She has tried various remedies, including tea and suppositories, with limited success. She reports that her normal bowel pattern is to have a bowel movement every three days.      ALLERGIES:  is allergic to chlorhexidine .  MEDICATIONS:  Current  Outpatient Medications  Medication Sig Dispense Refill   prochlorperazine  (COMPAZINE ) 10 MG tablet Take 1 tablet (10 mg total) by mouth every 6 (six) hours as needed for nausea or vomiting. 30 tablet 1   dexamethasone  (DECADRON ) 4 MG tablet Take 1 tablet day after chemo and 1 tablet 2 days after chemo with food (Patient not taking: Reported on 07/16/2023) 8 tablet 0   naproxen  (NAPROSYN ) 500 MG tablet Take 500 mg by mouth 2 (two) times daily as needed. (Patient not taking: Reported on 09/15/2023)     ondansetron  (ZOFRAN ) 8 MG tablet Take 1 tab (8 mg) by mouth every 8 hrs as needed for nausea/vomiting. Start third day after doxorubicin /cyclophosphamide  chemotherapy. (Patient not taking: Reported on 09/15/2023) 30 tablet 1   pantoprazole  (PROTONIX ) 40 MG tablet TAKE 1 TABLET BY MOUTH EVERY DAY (Patient not taking: Reported on 09/15/2023) 90 tablet 1   No current facility-administered medications for this visit.    PHYSICAL EXAMINATION: ECOG PERFORMANCE STATUS: 1 - Symptomatic but completely ambulatory  Vitals:   09/15/23 1000  BP: 115/78  Pulse: 100  Resp: 18  Temp: (!) 97.4 F (36.3 C)  SpO2: 100%   Filed Weights   09/15/23 1000  Weight: 184 lb (83.5 kg)      LABORATORY DATA:  I have reviewed the data as listed    Latest Ref Rng & Units 09/01/2023   10:56 AM 08/18/2023    9:24 AM 08/04/2023   10:08 AM  CMP  Glucose 70 - 99 mg/dL 784  696  295   BUN 6 - 20 mg/dL 11  11  9  Creatinine 0.44 - 1.00 mg/dL 9.14  7.82  9.56   Sodium 135 - 145 mmol/L 142  142  141   Potassium 3.5 - 5.1 mmol/L 3.8  3.8  3.8   Chloride 98 - 111 mmol/L 110  109  109   CO2 22 - 32 mmol/L 27  28  26    Calcium 8.9 - 10.3 mg/dL 9.1  9.1  8.9   Total Protein 6.5 - 8.1 g/dL 6.2  6.4  6.9   Total Bilirubin 0.0 - 1.2 mg/dL 0.2  0.2  0.2   Alkaline Phos 38 - 126 U/L 112  108  102   AST 15 - 41 U/L 16  19  17    ALT 0 - 44 U/L 19  27  22      Lab Results  Component Value Date   WBC 14.7 (H) 09/15/2023   HGB  9.9 (L) 09/15/2023   HCT 29.9 (L) 09/15/2023   MCV 94.6 09/15/2023   PLT 202 09/15/2023   NEUTROABS 10.3 (H) 09/15/2023    ASSESSMENT & PLAN:  Malignant neoplasm of overlapping sites of left female breast (HCC) 05/23/2023: Left nipple punch biopsy: Infiltrating carcinoma consistent with breast origin (CK7 GATA3 and ER positive) ER 30%, PR 20%, HER2 1+ (send T4, N0, M0, stage IIIb) 06/02/2023: Breast MRI: 2.8 cm biopsy-proven malignancy left nipple retroareolar left breast, 5 cm enhancement UOQ left breast posterior superior to the biopsy-proven malignancy, highly suspicious left breast enhancement spanning 9.8 cm. 06/18/2023: Left breast biopsy 2:30 position: Grade 2 IDC with DCIS ER 75%, PR 75%, Ki67 20%, HER2 1+ negative   Treatment plan: Biopsy on the 5 cm of enhancement: Grade 2 IDC, ER 75%, PR 75%, Ki 67: 20%, HER 2 1+ Neg CT scans and bone scan: 06/18/23:Left breast mass 2.2 cm, prom axillary LN, no distant mets, non specific pancreatic head/ uncinate mass 07/02/2023: Oncotype DX: 30 (distant recurrence at 9 years: 19%) Neoadjuvant chemotherapy with dose dense Adriamycin  and Cytoxan  x 4 followed by Taxol x 12 started 07/21/2023 Mastectomy versus lumpectomy (patient wishes to do bilateral breast reductions if possible): Will be determined by Dr. Afton Horse Adjuvant radiation Adjuvant antiestrogen therapy ------------------------------------------------------------------------------------------------------------------------------------ Current treatment: Completed 4 cycles of neoadjuvant dose dense Adriamycin  Cytoxan , today is cycle 1 Taxol 07/09/2023: Echo: EF 60 to 65% Chemo toxicities: Constipation: Instructed to take MiraLAX if needed Chemo-induced anemia: Monitoring closely Dry nonproductive cough: Could be acid reflux instructed to pick up the Protonix  and start taking it.   We decided to give her 4 mg of Decadron  today.  If she does very well for the next treatment we can discontinue  Decadron .   Return to clinic weekly for Taxol treatments She is being consented for ice compress clinical trial ------------------------------------- Assessment and Plan Assessment & Plan Chemotherapy management Current regimen includes Taxol with premedications. Steroids prevent reactions but may cause insomnia and acid reflux. Plan to reduce steroid dose. - Administer Benadryl, Pepcid, and Decadron  before Taxol. - Reduce Decadron  to 4 mg, monitor tolerance. - Eliminate Decadron  if tolerated.  Constipation due to chemotherapy Constipation likely from chemotherapy and antiemetics. Miralax recommended. - Use Miralax if constipation persists over two days.  Allergic rhinitis Symptoms likely from pollen exposure. Zyrtec effective. Consider acid reflux as a cough factor. - Continue Zyrtec. - Wear mask outdoors. - Trial over-the-counter acid reflux medication for two weeks.      No orders of the defined types were placed in this encounter.  The patient has a good understanding of  the overall plan. she agrees with it. she will call with any problems that may develop before the next visit here. Total time spent: 45 mins including face to face time and time spent for planning, charting and co-ordination of care   Margert Sheerer, MD 09/15/23

## 2023-09-15 NOTE — Assessment & Plan Note (Signed)
 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 06/18/2023: Left breast biopsy 2:30 position: Grade 2 IDC with DCIS ER 75%, PR 75%, Ki67 20%, HER2 1+ negative   Treatment plan: Biopsy on the 5 cm of enhancement: Grade 2 IDC, ER 75%, PR 75%, Ki 67: 20%, HER 2 1+ Neg CT scans and bone scan: 06/18/23:Left breast mass 2.2 cm, prom axillary LN, no distant mets, non specific pancreatic head/ uncinate mass 07/02/2023: Oncotype DX: 30 (distant recurrence at 9 years: 19%) Neoadjuvant chemotherapy with dose dense Adriamycin  and Cytoxan  x 4 followed by Taxol x 12 started 07/21/2023 Mastectomy versus lumpectomy (patient wishes to do bilateral breast reductions if possible): Will be determined by Dr. Afton Horse Adjuvant radiation Adjuvant antiestrogen therapy ------------------------------------------------------------------------------------------------------------------------------------ Current treatment: Completed 4 cycles of neoadjuvant dose dense Adriamycin  Cytoxan , today is cycle 1 Taxol 07/09/2023: Echo: EF 60 to 65% Chemo toxicities:    Her biggest complaint has been this epigastric pain which is probably due to gastritis.  This is being managed with Protonix  Return to clinic weekly for Taxol treatments She is being consented for ice compress clinical trial

## 2023-09-15 NOTE — Research (Signed)
 Z6109, ICE COMPRESS: RANDOMIZED TRIAL OF LIMB CRYOCOMPRESSION VERSUS CONTINUOUS COMPRESSION VERSUS LOW CYCLIC COMPRESSION FOR THE PREVENTION  OF TAXANE-INDUCED PERIPHERAL NEUROPATHY     Tratment 1 Patient arrives today Unaccompanied for the treatment 1 taxol infusion. Confirmed patient does not have wounds, sores, or lesions to extremities. Patient has not had any vaccinations since last visit.     ADVERSE EVENTS: Solicited AEs reviewed with patient. She denies any sensory or motor neuropathy symptoms. Patient does not report any changes since last visit. See AE table below.   BASELINE AEs Adverse Event CTCAE Grade Onset date Resolved date Relationship to Study Intervention Relationship to Taxane Action Taken Comments  Skin atrophy (solicited) 0              Skin hyperpigmentation (solicited) 0              Skin hypopigmentation (solicited) 0              Skin induration (solicited) 0              Skin ulceration (solicited) 0              Rash maculopapular (solicited) 0              Nail changes (solicited) 0                Cold intolerance (solicited) (general disorders and administration site conditions- other) 0              Frostbite (solicited) (skin and subcutaneous tissue disorders- other) 0              Nail Discoloration 0             STUDY INTERVENTION & TOLERABILITY ASSESSMENTS: 12:00 pm- Device wraps applied; pre-treatment started set on Arm 1 CryoCompression with cooling temperature at 11 degrees Celsius. 12:05 pm- Required 5-15 minute Tolerability check- pt states intolerable to cryo compression at 11 degrees celsius. Temperature raised to 12 degrees celsius. 12:15pm Temperature at 12 degrees intolerable. Temperature raised to 13 degrees celsius. 12:45pm Temperature Tolerability 30 minute Reassessment. Temperature at 13 degrees intolerable . Patient refused temperature raise to 15 degrees celsius. Patient withdrew from [facility] device Cryocompression Arm. Patient requests  to discontinue Paxman device Cryocompression Arm permanently for all remaining taxol treatment cycles. 12:45 pm- Study intervention end time. Patient unwrapped from study device. Confirmed no new wounds, sores, or lesions. Patient reported indents marks on arm/legs from wraps. Patient reports Cryocompression intolerable and way too cold.   Patient did not reach time for an hourly tolerability check(s), taxane infusion start/stop time, nor post infusion end time. No break time to make up.   PLAN:  The patient was thanked for their time and discontinued participation in [place] device. She understands research nurse will meet her again at next scheduled assessment appointment. Patient Kelsey Carlson has been provided direct contact information and is encouraged to contact this Coordinator for any needs or questions.   Patient was left in the car of her infusion Nurse for the remainder of her treatment today.  Pinkey Brier, MPH  Clinical Research Coordinator

## 2023-09-15 NOTE — Research (Signed)
 DCP-001: Use of a Clinical Trial Screening Tool to Address Cancer Health Disparities in the [facility] Oncology Research Program [facility])    Patient Kelsey Carlson was identified by Dr. Gudena as a potential candidate for the above listed study.  This Clinical Research Coordinator met with Kelsey Carlson, [id number], on 09/15/23 in a manner and location that ensures patient privacy to discuss participation in the above listed research study.  Patient is Unaccompanied.  A copy of the informed consent document and separate HIPAA Authorization was provided to the patient.  Patient reads, speaks, and understands [place].   Patient was provided with the business card of this Coordinator and encouraged to contact the research team with any questions.  Approximately 10 minutes were spent with the patient reviewing the informed consent documents.  Patient was provided the option of taking informed consent documents home to review and was encouraged to review at their convenience with their support network, including other care providers.   Patient declined study participation. Dr. Lee Public notified.

## 2023-09-16 ENCOUNTER — Encounter: Payer: Self-pay | Admitting: Hematology and Oncology

## 2023-09-16 NOTE — Telephone Encounter (Signed)
 Called pt to see how she did with her recent treatment.  She reports doing well.  She had a little trouble getting to sleep last night but better than before.  She denies any problems & knows how to reach us  if needed & knows her next appt.

## 2023-09-16 NOTE — Telephone Encounter (Signed)
-----   Message from Nurse Rexene Catching sent at 09/15/2023  3:17 PM EDT ----- Regarding: FT chemo [place] FT taxol (previously A/C). Able to tolerate treatment but NOT able to tolerate paxman.

## 2023-09-22 ENCOUNTER — Inpatient Hospital Stay: Attending: Hematology and Oncology

## 2023-09-22 ENCOUNTER — Inpatient Hospital Stay

## 2023-09-22 ENCOUNTER — Inpatient Hospital Stay (HOSPITAL_BASED_OUTPATIENT_CLINIC_OR_DEPARTMENT_OTHER): Admitting: Hematology and Oncology

## 2023-09-22 VITALS — BP 112/78 | HR 93 | Temp 97.8°F | Resp 18 | Ht 64.0 in | Wt 184.2 lb

## 2023-09-22 DIAGNOSIS — T451X5A Adverse effect of antineoplastic and immunosuppressive drugs, initial encounter: Secondary | ICD-10-CM | POA: Diagnosis not present

## 2023-09-22 DIAGNOSIS — I8393 Asymptomatic varicose veins of bilateral lower extremities: Secondary | ICD-10-CM | POA: Insufficient documentation

## 2023-09-22 DIAGNOSIS — C50812 Malignant neoplasm of overlapping sites of left female breast: Secondary | ICD-10-CM

## 2023-09-22 DIAGNOSIS — Z170 Estrogen receptor positive status [ER+]: Secondary | ICD-10-CM | POA: Diagnosis not present

## 2023-09-22 DIAGNOSIS — R5383 Other fatigue: Secondary | ICD-10-CM | POA: Insufficient documentation

## 2023-09-22 DIAGNOSIS — G4700 Insomnia, unspecified: Secondary | ICD-10-CM | POA: Insufficient documentation

## 2023-09-22 DIAGNOSIS — N200 Calculus of kidney: Secondary | ICD-10-CM | POA: Diagnosis not present

## 2023-09-22 DIAGNOSIS — G4730 Sleep apnea, unspecified: Secondary | ICD-10-CM | POA: Diagnosis not present

## 2023-09-22 DIAGNOSIS — F17210 Nicotine dependence, cigarettes, uncomplicated: Secondary | ICD-10-CM | POA: Diagnosis not present

## 2023-09-22 DIAGNOSIS — Z79899 Other long term (current) drug therapy: Secondary | ICD-10-CM | POA: Diagnosis not present

## 2023-09-22 DIAGNOSIS — D6481 Anemia due to antineoplastic chemotherapy: Secondary | ICD-10-CM | POA: Insufficient documentation

## 2023-09-22 DIAGNOSIS — Z803 Family history of malignant neoplasm of breast: Secondary | ICD-10-CM | POA: Diagnosis not present

## 2023-09-22 DIAGNOSIS — K5900 Constipation, unspecified: Secondary | ICD-10-CM | POA: Diagnosis not present

## 2023-09-22 DIAGNOSIS — Z79811 Long term (current) use of aromatase inhibitors: Secondary | ICD-10-CM | POA: Insufficient documentation

## 2023-09-22 DIAGNOSIS — Z8042 Family history of malignant neoplasm of prostate: Secondary | ICD-10-CM | POA: Insufficient documentation

## 2023-09-22 DIAGNOSIS — G8929 Other chronic pain: Secondary | ICD-10-CM | POA: Insufficient documentation

## 2023-09-22 DIAGNOSIS — Z5111 Encounter for antineoplastic chemotherapy: Secondary | ICD-10-CM | POA: Insufficient documentation

## 2023-09-22 DIAGNOSIS — Z95828 Presence of other vascular implants and grafts: Secondary | ICD-10-CM

## 2023-09-22 DIAGNOSIS — K219 Gastro-esophageal reflux disease without esophagitis: Secondary | ICD-10-CM | POA: Insufficient documentation

## 2023-09-22 DIAGNOSIS — R059 Cough, unspecified: Secondary | ICD-10-CM | POA: Insufficient documentation

## 2023-09-22 LAB — CMP (CANCER CENTER ONLY)
ALT: 26 U/L (ref 0–44)
AST: 17 U/L (ref 15–41)
Albumin: 3.8 g/dL (ref 3.5–5.0)
Alkaline Phosphatase: 67 U/L (ref 38–126)
Anion gap: 4 — ABNORMAL LOW (ref 5–15)
BUN: 10 mg/dL (ref 6–20)
CO2: 26 mmol/L (ref 22–32)
Calcium: 9.1 mg/dL (ref 8.9–10.3)
Chloride: 110 mmol/L (ref 98–111)
Creatinine: 0.65 mg/dL (ref 0.44–1.00)
GFR, Estimated: >60 mL/min (ref >60)
Glucose, Bld: 115 mg/dL — ABNORMAL HIGH (ref 70–99)
Potassium: 3.7 mmol/L (ref 3.5–5.1)
Sodium: 140 mmol/L (ref 135–145)
Total Bilirubin: 0.3 mg/dL (ref 0.0–1.2)
Total Protein: 6 g/dL — ABNORMAL LOW (ref 6.5–8.1)

## 2023-09-22 LAB — CBC WITH DIFFERENTIAL (CANCER CENTER ONLY)
Abs Immature Granulocytes: 0.19 10*3/uL — ABNORMAL HIGH (ref 0.00–0.07)
Basophils Absolute: 0.2 10*3/uL — ABNORMAL HIGH (ref 0.0–0.1)
Basophils Relative: 2 %
Eosinophils Absolute: 0.2 10*3/uL (ref 0.0–0.5)
Eosinophils Relative: 3 %
HCT: 27.6 % — ABNORMAL LOW (ref 36.0–46.0)
Hemoglobin: 9.5 g/dL — ABNORMAL LOW (ref 12.0–15.0)
Immature Granulocytes: 3 %
Lymphocytes Relative: 15 %
Lymphs Abs: 1 10*3/uL (ref 0.7–4.0)
MCH: 32.2 pg (ref 26.0–34.0)
MCHC: 34.4 g/dL (ref 30.0–36.0)
MCV: 93.6 fL (ref 80.0–100.0)
Monocytes Absolute: 0.9 10*3/uL (ref 0.1–1.0)
Monocytes Relative: 13 %
Neutro Abs: 4.3 10*3/uL (ref 1.7–7.7)
Neutrophils Relative %: 64 %
Platelet Count: 363 10*3/uL (ref 150–400)
RBC: 2.95 MIL/uL — ABNORMAL LOW (ref 3.87–5.11)
RDW: 16.7 % — ABNORMAL HIGH (ref 11.5–15.5)
WBC Count: 6.8 10*3/uL (ref 4.0–10.5)
nRBC: 0 % (ref 0.0–0.2)

## 2023-09-22 MED ORDER — SODIUM CHLORIDE 0.9 % IV SOLN: INTRAVENOUS | Status: DC

## 2023-09-22 MED ORDER — FAMOTIDINE IN NACL 20-0.9 MG/50ML-% IV SOLN
20.0000 mg | Freq: Once | INTRAVENOUS | Status: AC
  Administered 2023-09-22: 20 mg via INTRAVENOUS
  Filled 2023-09-22: qty 50

## 2023-09-22 MED ORDER — DIPHENHYDRAMINE HCL 50 MG/ML IJ SOLN
25.0000 mg | Freq: Once | INTRAMUSCULAR | Status: AC
  Administered 2023-09-22: 25 mg via INTRAVENOUS
  Filled 2023-09-22: qty 1

## 2023-09-22 MED ORDER — SODIUM CHLORIDE 0.9% FLUSH
10.0000 mL | Freq: Once | INTRAVENOUS | Status: AC
  Administered 2023-09-22: 10 mL

## 2023-09-22 MED ORDER — DEXAMETHASONE SODIUM PHOSPHATE 10 MG/ML IJ SOLN
4.0000 mg | Freq: Once | INTRAMUSCULAR | Status: AC
  Administered 2023-09-22: 4 mg via INTRAVENOUS
  Filled 2023-09-22: qty 1

## 2023-09-22 MED ORDER — SODIUM CHLORIDE 0.9 % IV SOLN
80.0000 mg/m2 | Freq: Once | INTRAVENOUS | Status: AC
  Administered 2023-09-22: 156 mg via INTRAVENOUS
  Filled 2023-09-22: qty 26

## 2023-09-22 NOTE — Assessment & Plan Note (Signed)
 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 06/18/2023: Left breast biopsy 2:30 position: Grade 2 IDC with DCIS ER 75%, PR 75%, Ki67 20%, HER2 1+ negative   Treatment plan: Biopsy on the 5 cm of enhancement: Grade 2 IDC, ER 75%, PR 75%, Ki 67: 20%, HER 2 1+ Neg CT scans and bone scan: 06/18/23:Left breast mass 2.2 cm, prom axillary LN, no distant mets, non specific pancreatic head/ uncinate mass 07/02/2023: Oncotype DX: 30 (distant recurrence at 9 years: 19%) Neoadjuvant chemotherapy with dose dense Adriamycin  and Cytoxan  x 4 followed by Taxol  x 12 started 07/21/2023 Mastectomy versus lumpectomy (patient wishes to do bilateral breast reductions if possible): Will be determined by Dr. Afton Horse Adjuvant radiation Adjuvant antiestrogen therapy ------------------------------------------------------------------------------------------------------------------------------------ Current treatment: Completed 4 cycles of neoadjuvant dose dense Adriamycin  Cytoxan , today is cycle 2 Taxol  07/09/2023: Echo: EF 60 to 65% Chemo toxicities: Constipation: Instructed to take MiraLAX if needed Chemo-induced anemia: Monitoring closely Dry nonproductive cough: Could be acid reflux instructed to pick up the Protonix  and start taking it.  We discussed and discontinue dexamethasone . Return to clinic weekly for Taxol  treatments.

## 2023-09-22 NOTE — Patient Instructions (Signed)

## 2023-09-22 NOTE — Progress Notes (Signed)
 Patient Care Team: Judithann Novas, MD as PCP - General Alane Hsu, RN as Oncology Nurse Navigator Auther Bo, RN as Oncology Nurse Navigator Cameron Cea, MD as Consulting Physician (Hematology and Oncology) Sim Dryer, MD as Consulting Physician (General Surgery) Johna Myers, MD as Consulting Physician (Radiation Oncology)  DIAGNOSIS:  Encounter Diagnosis  Name Primary?   Malignant neoplasm of overlapping sites of left breast in female, estrogen receptor positive (HCC) Yes    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 (HCC)   06/10/2023 Cancer Staging   Staging form: Breast, AJCC 8th Edition - Clinical: cT4, cN0, cM0, GX, ER+, PR+, HER2- - Signed by Cameron Cea, MD on 06/10/2023 Stage prefix: Initial diagnosis Histologic grading system: 3 grade system   07/21/2023 -  Chemotherapy   Patient is on Treatment Plan : BREAST DOSE DENSE AC q14d / PACLitaxel  q7d       CHIEF COMPLIANT: Cycle 2 Taxol   HISTORY OF PRESENT ILLNESS: History of Present Illness Kelsey Carlson is a 58 year old female undergoing chemotherapy for cancer who presents for follow-up regarding her treatment and side effects.  She is undergoing chemotherapy with Taxol . The cold therapy machine was intolerable due to shivering and headaches, leading to its discontinuation. She now uses compression socks and gloves to manage side effects. Energy levels remain low. Taste and appetite have changed, with a preference for sweet foods.  Anemia is present with hemoglobin levels decreasing from 9.9 to 9.5. White blood cell count is 6.8, within normal range. Platelet levels and other blood work are normal.  Constipation has improved with one Senna and one Vyleesi daily, though not consistently. Protonix  is used occasionally for stomach  acid.     ALLERGIES:  is allergic to chlorhexidine .  MEDICATIONS:  Current Outpatient Medications  Medication Sig Dispense Refill   dexamethasone  (DECADRON ) 4 MG tablet Take 1 tablet day after chemo and 1 tablet 2 days after chemo with food (Patient not taking: Reported on 09/22/2023) 8 tablet 0   naproxen  (NAPROSYN ) 500 MG tablet Take 500 mg by mouth 2 (two) times daily as needed. (Patient not taking: Reported on 09/22/2023)     ondansetron  (ZOFRAN ) 8 MG tablet Take 1 tab (8 mg) by mouth every 8 hrs as needed for nausea/vomiting. Start third day after doxorubicin /cyclophosphamide  chemotherapy. (Patient not taking: Reported on 09/22/2023) 30 tablet 1   pantoprazole  (PROTONIX ) 40 MG tablet TAKE 1 TABLET BY MOUTH EVERY DAY (Patient not taking: Reported on 09/22/2023) 90 tablet 1   prochlorperazine  (COMPAZINE ) 10 MG tablet Take 1 tablet (10 mg total) by mouth every 6 (six) hours as needed for nausea or vomiting. (Patient not taking: Reported on 09/22/2023) 30 tablet 1   No current facility-administered medications for this visit.    PHYSICAL EXAMINATION: ECOG PERFORMANCE STATUS: 1 - Symptomatic but completely ambulatory  Vitals:   09/22/23 1035  BP: 112/78  Pulse: 93  Resp: 18  Temp: 97.8 F (36.6 C)  SpO2: 100%   Filed Weights   09/22/23 1035  Weight: 184 lb 3.2 oz (83.6 kg)    LABORATORY DATA:  I have reviewed the data as listed    Latest Ref Rng & Units 09/15/2023   10:14 AM 09/01/2023   10:56 AM 08/18/2023    9:24 AM  CMP  Glucose 70 - 99 mg/dL 696  295  111   BUN 6 - 20 mg/dL 5  11  11    Creatinine 0.44 - 1.00 mg/dL 5.36  6.44  0.34   Sodium 135 - 145 mmol/L 144  142  142   Potassium 3.5 - 5.1 mmol/L 3.5  3.8  3.8   Chloride 98 - 111 mmol/L 111  110  109   CO2 22 - 32 mmol/L 27  27  28    Calcium 8.9 - 10.3 mg/dL 9.1  9.1  9.1   Total Protein 6.5 - 8.1 g/dL 6.3  6.2  6.4   Total Bilirubin 0.0 - 1.2 mg/dL 0.3  0.2  0.2   Alkaline Phos 38 - 126 U/L 100  112  108   AST 15 - 41  U/L 15  16  19    ALT 0 - 44 U/L 17  19  27      Lab Results  Component Value Date   WBC 6.8 09/22/2023   HGB 9.5 (L) 09/22/2023   HCT 27.6 (L) 09/22/2023   MCV 93.6 09/22/2023   PLT 363 09/22/2023   NEUTROABS 4.3 09/22/2023    ASSESSMENT & PLAN:  Malignant neoplasm of overlapping sites of left female breast (HCC) 05/23/2023: Left nipple punch biopsy: Infiltrating carcinoma consistent with breast origin (CK7 GATA3 and ER positive) ER 30%, PR 20%, HER2 1+ (send T4, N0, M0, stage IIIb) 06/02/2023: Breast MRI: 2.8 cm biopsy-proven malignancy left nipple retroareolar left breast, 5 cm enhancement UOQ left breast posterior superior to the biopsy-proven malignancy, highly suspicious left breast enhancement spanning 9.8 cm. 06/18/2023: Left breast biopsy 2:30 position: Grade 2 IDC with DCIS ER 75%, PR 75%, Ki67 20%, HER2 1+ negative   Treatment plan: Biopsy on the 5 cm of enhancement: Grade 2 IDC, ER 75%, PR 75%, Ki 67: 20%, HER 2 1+ Neg CT scans and bone scan: 06/18/23:Left breast mass 2.2 cm, prom axillary LN, no distant mets, non specific pancreatic head/ uncinate mass 07/02/2023: Oncotype DX: 30 (distant recurrence at 9 years: 19%) Neoadjuvant chemotherapy with dose dense Adriamycin  and Cytoxan  x 4 followed by Taxol  x 12 started 07/21/2023 Mastectomy versus lumpectomy (patient wishes to do bilateral breast reductions if possible): Will be determined by Dr. Afton Horse Adjuvant radiation Adjuvant antiestrogen therapy ------------------------------------------------------------------------------------------------------------------------------------ Current treatment: Completed 4 cycles of neoadjuvant dose dense Adriamycin  Cytoxan , today is cycle 2 Taxol  07/09/2023: Echo: EF 60 to 65% Chemo toxicities: Constipation: Instructed to take MiraLAX if needed Chemo-induced anemia: Monitoring closely Dry nonproductive cough: Could be acid reflux instructed to pick up the Protonix  and start taking it.  We  discussed and discontinue dexamethasone . Return to clinic weekly for Taxol  treatments. ------------------------------------- Assessment and Plan Assessment & Plan Malignant neoplasm of left breast Undergoing Taxol  treatment with good response. Headaches from cold therapy noted; considering alternatives. MRI planned post-treatment to evaluate response and plan surgery. Surgical options to be discussed post-MRI. - Continue Taxol  treatment. - Schedule MRI after last Taxol  treatment. - Plan surgical consultation around tenth round of Taxol . - Discuss surgical options (lumpectomy vs mastectomy) with surgeon after MRI results.  Chemo-induced anemia Hemoglobin decreased from 9.9 to 9.5, expected during recovery. White blood cell count normal without growth factor support. Anemia expected to improve gradually. - Monitor hemoglobin levels. - Consider growth factor support if needed after six to eight rounds of Taxol .  Constipation due to chemotherapy Constipation improved with Senna and Vyleesi. Current regimen effective. - Continue Senna and Vyleesi as needed.  Acid reflux Acid reflux well-controlled with occasional Protonix . - Continue  Protonix  as needed.       No orders of the defined types were placed in this encounter.  The patient has a good understanding of the overall plan. she agrees with it. she will call with any problems that may develop before the next visit here. Total time spent: 30 mins including face to face time and time spent for planning, charting and co-ordination of care   Viinay K Dulcy Sida, MD 09/22/23

## 2023-09-28 NOTE — Progress Notes (Unsigned)
 [facility] Cancer Follow up:    Kelsey Novas, MD [address] [place] [address]   DIAGNOSIS: Cancer Staging  Malignant neoplasm of overlapping sites of left female breast [facility]) Staging form: Breast, AJCC 8th Edition - Clinical: cT4, cN0, cM0, GX, ER+, PR+, HER2- - Signed by Cameron Cea, MD on 06/10/2023 Stage prefix: Initial diagnosis Histologic grading system: 3 grade system    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 (HCC)   06/10/2023 Cancer Staging   Staging form: Breast, AJCC 8th Edition - Clinical: cT4, cN0, cM0, GX, ER+, PR+, HER2- - Signed by Cameron Cea, MD on 06/10/2023 Stage prefix: Initial diagnosis Histologic grading system: 3 grade system   07/21/2023 -  Chemotherapy   Patient is on Treatment Plan : BREAST DOSE DENSE AC q14d / PACLitaxel  q7d       CURRENT THERAPY: weekly taxol   INTERVAL HISTORY:  Discussed the use of AI scribe software for clinical note transcription with the patient, who gave verbal consent to proceed.  Kelsey Carlson 58 y.o. female returns for f/u and evaluation prior to receiving week 3 of Taxol .    Patient Active Problem List   Diagnosis Date Noted   Port-A-Cath in place 07/21/2023   Malignant neoplasm of overlapping sites of left female breast (HCC) 06/09/2023   Bilateral hearing loss 12/24/2022   Tinnitus of both ears 06/09/2020   Acute gastritis 08/03/2019   Chronic left hip pain 12/02/2017   Chronic back pain 12/02/2017   Class 1 obesity due to excess calories with body mass index (BMI) of 34.0 to 34.9 in adult 12/08/2014   Varicose veins of both legs with edema 12/08/2014   Insomnia 02/18/2012   MDD (recurrent major depressive disorder) in remission (HCC) 01/16/2012   TRANSAMINASES, SERUM, ELEVATED 05/27/2008    NEPHROLITHIASIS, HX OF 05/23/2008   Sleep apnea 05/30/2007   NECK PAIN, CHRONIC 02/25/2007   Chest pain 02/25/2007    is allergic to chlorhexidine .  MEDICAL HISTORY: Past Medical History:  Diagnosis Date   Arthritis    Gallstones    History of kidney stones    Lumbar back pain 06/28/2013   Nephrolithiasis    Urolithiasis    Wears glasses     SURGICAL HISTORY: Past Surgical History:  Procedure Laterality Date   ANKLE ARTHRODESIS  1981   left ankle bone tumor-   BLADDER SUSPENSION  2008   sling-cysto   breast biopsy  2002   lt br bx   BREAST CYST EXCISION Left 02/19/2013   Procedure:  EXCISION BREAST MASS;  Surgeon: Andy Bannister A. Cornett, MD;  Location: [facility];  Service: General;  Laterality: Left;   BREAST EXCISIONAL BIOPSY Left 2014   benign   BREAST SURGERY     CHOLECYSTECTOMY N/A 08/27/2017   Procedure: LAPAROSCOPIC CHOLECYSTECTOMY WITH INTRAOPERATIVE CHOLANGIOGRAM;  Surgeon: Caralyn Chandler, MD;  Location: [facility] OR;  Service: General;  Laterality: N/A;   DILATION AND CURETTAGE OF UTERUS     IR IMAGING GUIDED PORT INSERTION  07/15/2023   LUMBAR DISC SURGERY  1997   lumb lam   TUBAL LIGATION     Essure    SOCIAL HISTORY: Social History   Socioeconomic History   Marital status: Married    Spouse name: Not on file   Number of children: Not on file  Years of education: Not on file   Highest education level: Not on file  Occupational History    Employer: [employer]    Comment: Mailroom  Tobacco Use   Smoking status: Former    Current packs/day: 0.00    Types: Cigarettes    Quit date: 05/20/1994    Years since quitting: 29.3   Smokeless tobacco: Never  Vaping Use   Vaping status: Every Day  Substance and Sexual Activity   Alcohol use: Yes    Comment: socially   Drug use: No   Sexual activity: Yes    Birth control/protection: Surgical    Comment: Essure BTL  Other Topics Concern   Not on file  Social History Narrative   Regular exercise-  yes 3 x a week      Diet- fruits and veggies   Social Drivers of Health   Financial Resource Strain: Not on file  Food Insecurity: No Food Insecurity (06/11/2023)   Hunger Vital Sign    Worried About Running Out of Food in the Last Year: Never true    Ran Out of Food in the Last Year: Never true  Transportation Needs: No Transportation Needs (06/11/2023)   PRAPARE - Administrator, Civil Service (Medical): No    Lack of Transportation (Non-Medical): No  Physical Activity: Not on file  Stress: Not on file  Social Connections: Not on file  Intimate Partner Violence: Not At Risk (06/11/2023)   Humiliation, Afraid, Rape, and Kick questionnaire    Fear of Current or Ex-Partner: No    Emotionally Abused: No    Physically Abused: No    Sexually Abused: No    FAMILY HISTORY: Family History  Problem Relation Age of Onset   Breast cancer Mother    Hypertension Father    Prostate cancer Father     Review of Systems  Constitutional:  Negative for appetite change, chills, fatigue, fever and unexpected weight change.  HENT:   Negative for hearing loss, lump/mass and trouble swallowing.   Eyes:  Negative for eye problems and icterus.  Respiratory:  Negative for chest tightness, cough and shortness of breath.   Cardiovascular:  Negative for chest pain, leg swelling and palpitations.  Gastrointestinal:  Negative for abdominal distention, abdominal pain, constipation, diarrhea, nausea and vomiting.  Endocrine: Negative for hot flashes.  Genitourinary:  Negative for difficulty urinating.   Musculoskeletal:  Negative for arthralgias.  Skin:  Negative for itching and rash.  Neurological:  Negative for dizziness, extremity weakness, headaches and numbness.  Hematological:  Negative for adenopathy. Does not bruise/bleed easily.  Psychiatric/Behavioral:  Negative for depression. The patient is not nervous/anxious.       PHYSICAL EXAMINATION    There were no vitals filed for  this visit.  Physical Exam Constitutional:      General: She is not in acute distress.    Appearance: Normal appearance. She is not toxic-appearing.  HENT:     Head: Normocephalic and atraumatic.     Mouth/Throat:     Mouth: Mucous membranes are moist.     Pharynx: Oropharynx is clear. No oropharyngeal exudate or posterior oropharyngeal erythema.  Eyes:     General: No scleral icterus. Cardiovascular:     Rate and Rhythm: Normal rate and regular rhythm.     Pulses: Normal pulses.     Heart sounds: Normal heart sounds.  Pulmonary:     Effort: Pulmonary effort is normal.     Breath sounds: Normal breath sounds.  Abdominal:     General: Abdomen is flat. Bowel sounds are normal. There is no distension.     Palpations: Abdomen is soft.     Tenderness: There is no abdominal tenderness.  Musculoskeletal:        General: No swelling.     Cervical back: Neck supple.  Lymphadenopathy:     Cervical: No cervical adenopathy.  Skin:    General: Skin is warm and dry.     Findings: No rash.  Neurological:     General: No focal deficit present.     Mental Status: She is alert.  Psychiatric:        Mood and Affect: Mood normal.        Behavior: Behavior normal.     LABORATORY DATA:  CBC    Component Value Date/Time   WBC 6.8 09/22/2023 1015   WBC 7.3 08/19/2017 0912   RBC 2.95 (L) 09/22/2023 1015   HGB 9.5 (L) 09/22/2023 1015   HCT 27.6 (L) 09/22/2023 1015   PLT 363 09/22/2023 1015   MCV 93.6 09/22/2023 1015   MCH 32.2 09/22/2023 1015   MCHC 34.4 09/22/2023 1015   RDW 16.7 (H) 09/22/2023 1015   LYMPHSABS 1.0 09/22/2023 1015   MONOABS 0.9 09/22/2023 1015   EOSABS 0.2 09/22/2023 1015   BASOSABS 0.2 (H) 09/22/2023 1015    CMP     Component Value Date/Time   NA 140 09/22/2023 1015   K 3.7 09/22/2023 1015   CL 110 09/22/2023 1015   CO2 26 09/22/2023 1015   GLUCOSE 115 (H) 09/22/2023 1015   BUN 10 09/22/2023 1015   CREATININE 0.65 09/22/2023 1015   CALCIUM 9.1  09/22/2023 1015   PROT 6.0 (L) 09/22/2023 1015   ALBUMIN 3.8 09/22/2023 1015   AST 17 09/22/2023 1015   ALT 26 09/22/2023 1015   ALKPHOS 67 09/22/2023 1015   BILITOT 0.3 09/22/2023 1015   GFRNONAA >60 09/22/2023 1015   GFRAA 102 05/23/2008 1054     ASSESSMENT and THERAPY PLAN:   No problem-specific Assessment & Plan notes found for this encounter.     All questions were answered. The patient knows to call the clinic with any problems, questions or concerns. We can certainly see the patient much sooner if necessary.  Total encounter time:*** minutes*in face-to-face visit time, chart review, lab review, care coordination, order entry, and documentation of the encounter time.    Alwin Baars, NP 09/28/23 9:01 PM Medical Oncology and Hematology [facility] [address] [place], [place] [address] Tel. [phone]    Fax. [phone]  *Total Encounter Time as defined by the Centers for Medicare and Medicaid Services includes, in addition to the face-to-face time of a patient visit (documented in the note above) non-face-to-face time: obtaining and reviewing outside history, ordering and reviewing medications, tests or procedures, care coordination (communications with other health care professionals or caregivers) and documentation in the medical record.

## 2023-09-29 ENCOUNTER — Inpatient Hospital Stay

## 2023-09-29 ENCOUNTER — Inpatient Hospital Stay (HOSPITAL_BASED_OUTPATIENT_CLINIC_OR_DEPARTMENT_OTHER): Admitting: Adult Health

## 2023-09-29 ENCOUNTER — Encounter: Payer: Self-pay | Admitting: Adult Health

## 2023-09-29 VITALS — BP 126/60 | HR 79 | Temp 98.2°F | Resp 18 | Ht 64.0 in | Wt 183.3 lb

## 2023-09-29 DIAGNOSIS — Z170 Estrogen receptor positive status [ER+]: Secondary | ICD-10-CM

## 2023-09-29 DIAGNOSIS — Z5111 Encounter for antineoplastic chemotherapy: Secondary | ICD-10-CM | POA: Diagnosis not present

## 2023-09-29 DIAGNOSIS — C50812 Malignant neoplasm of overlapping sites of left female breast: Secondary | ICD-10-CM

## 2023-09-29 LAB — CBC WITH DIFFERENTIAL (CANCER CENTER ONLY)
Abs Immature Granulocytes: 0.05 10*3/uL (ref 0.00–0.07)
Basophils Absolute: 0.1 10*3/uL (ref 0.0–0.1)
Basophils Relative: 2 %
Eosinophils Absolute: 0.2 10*3/uL (ref 0.0–0.5)
Eosinophils Relative: 5 %
HCT: 28.3 % — ABNORMAL LOW (ref 36.0–46.0)
Hemoglobin: 9.6 g/dL — ABNORMAL LOW (ref 12.0–15.0)
Immature Granulocytes: 1 %
Lymphocytes Relative: 21 %
Lymphs Abs: 0.8 10*3/uL (ref 0.7–4.0)
MCH: 32.5 pg (ref 26.0–34.0)
MCHC: 33.9 g/dL (ref 30.0–36.0)
MCV: 95.9 fL (ref 80.0–100.0)
Monocytes Absolute: 0.6 10*3/uL (ref 0.1–1.0)
Monocytes Relative: 14 %
Neutro Abs: 2.4 10*3/uL (ref 1.7–7.7)
Neutrophils Relative %: 57 %
Platelet Count: 306 10*3/uL (ref 150–400)
RBC: 2.95 MIL/uL — ABNORMAL LOW (ref 3.87–5.11)
RDW: 17.1 % — ABNORMAL HIGH (ref 11.5–15.5)
WBC Count: 4.1 10*3/uL (ref 4.0–10.5)
nRBC: 0 % (ref 0.0–0.2)

## 2023-09-29 LAB — CMP (CANCER CENTER ONLY)
ALT: 32 U/L (ref 0–44)
AST: 20 U/L (ref 15–41)
Albumin: 3.9 g/dL (ref 3.5–5.0)
Alkaline Phosphatase: 65 U/L (ref 38–126)
Anion gap: 4 — ABNORMAL LOW (ref 5–15)
BUN: 9 mg/dL (ref 6–20)
CO2: 27 mmol/L (ref 22–32)
Calcium: 9.1 mg/dL (ref 8.9–10.3)
Chloride: 110 mmol/L (ref 98–111)
Creatinine: 0.61 mg/dL (ref 0.44–1.00)
GFR, Estimated: >60 mL/min (ref >60)
Glucose, Bld: 97 mg/dL (ref 70–99)
Potassium: 3.9 mmol/L (ref 3.5–5.1)
Sodium: 141 mmol/L (ref 135–145)
Total Bilirubin: 0.4 mg/dL (ref 0.0–1.2)
Total Protein: 6.1 g/dL — ABNORMAL LOW (ref 6.5–8.1)

## 2023-09-29 MED ORDER — DEXAMETHASONE SODIUM PHOSPHATE 10 MG/ML IJ SOLN
4.0000 mg | Freq: Once | INTRAMUSCULAR | Status: AC
  Administered 2023-09-29: 4 mg via INTRAVENOUS
  Filled 2023-09-29: qty 1

## 2023-09-29 MED ORDER — SODIUM CHLORIDE 0.9 % IV SOLN: INTRAVENOUS | Status: DC

## 2023-09-29 MED ORDER — DIPHENHYDRAMINE HCL 50 MG/ML IJ SOLN
25.0000 mg | Freq: Once | INTRAMUSCULAR | Status: AC
  Administered 2023-09-29: 25 mg via INTRAVENOUS
  Filled 2023-09-29: qty 1

## 2023-09-29 MED ORDER — ALBUTEROL SULFATE HFA 108 (90 BASE) MCG/ACT IN AERS: 2.0000 | INHALATION_SPRAY | Freq: Four times a day (QID) | RESPIRATORY_TRACT | 2 refills | Status: AC | PRN

## 2023-09-29 MED ORDER — FAMOTIDINE IN NACL 20-0.9 MG/50ML-% IV SOLN
20.0000 mg | Freq: Once | INTRAVENOUS | Status: AC
  Administered 2023-09-29: 20 mg via INTRAVENOUS
  Filled 2023-09-29: qty 50

## 2023-09-29 MED ORDER — SODIUM CHLORIDE 0.9 % IV SOLN
80.0000 mg/m2 | Freq: Once | INTRAVENOUS | Status: AC
  Administered 2023-09-29: 156 mg via INTRAVENOUS
  Filled 2023-09-29: qty 26

## 2023-09-29 NOTE — Patient Instructions (Signed)
 [facility] - A DEPT OF [facility]  Discharge Instructions: Thank you for choosing [facility] to provide your oncology and hematology care.   If you have a lab appointment with the Cancer Center, please go directly to the Cancer Center and check in at the registration area.   Wear comfortable clothing and clothing appropriate for easy access to any Portacath or PICC line.   We strive to give you quality time with your provider. You may need to reschedule your appointment if you arrive late (15 or more minutes).  Arriving late affects you and other patients whose appointments are after yours.  Also, if you miss three or more appointments without notifying the office, you may be dismissed from the clinic at the provider's discretion.      For prescription refill requests, have your pharmacy contact our office and allow 72 hours for refills to be completed.    Today you received the following chemotherapy and/or immunotherapy agents paclitaxel      To help prevent nausea and vomiting after your treatment, we encourage you to take your nausea medication as directed.  BELOW ARE SYMPTOMS THAT SHOULD BE REPORTED IMMEDIATELY: *FEVER GREATER THAN 100.4 F (38 C) OR HIGHER *CHILLS OR SWEATING *NAUSEA AND VOMITING THAT IS NOT CONTROLLED WITH YOUR NAUSEA MEDICATION *UNUSUAL SHORTNESS OF BREATH *UNUSUAL BRUISING OR BLEEDING *URINARY PROBLEMS (pain or burning when urinating, or frequent urination) *BOWEL PROBLEMS (unusual diarrhea, constipation, pain near the anus) TENDERNESS IN MOUTH AND THROAT WITH OR WITHOUT PRESENCE OF ULCERS (sore throat, sores in mouth, or a toothache) UNUSUAL RASH, SWELLING OR PAIN  UNUSUAL VAGINAL DISCHARGE OR ITCHING   Items with * indicate a potential emergency and should be followed up as soon as possible or go to the Emergency Department if any problems should occur.  Please show the CHEMOTHERAPY ALERT CARD or IMMUNOTHERAPY  ALERT CARD at check-in to the Emergency Department and triage nurse.  Should you have questions after your visit or need to cancel or reschedule your appointment, please contact [facility] - A DEPT OF [facility]  Dept: [phone]  and follow the prompts.  Office hours are 8:00 a.m. to 4:30 p.m. Monday - Friday. Please note that voicemails left after 4:00 p.m. may not be returned until the following business day.  We are closed weekends and major holidays. You have access to a nurse at all times for urgent questions. Please call the main number to the clinic Dept: [phone] and follow the prompts.   For any non-urgent questions, you may also contact your provider using MyChart. We now offer e-Visits for anyone 38 and older to request care online for non-urgent symptoms. For details visit [email or web address].   Also download the MyChart app! Go to the app store, search "MyChart", open the app, select [place], and log in with your MyChart username and password.

## 2023-09-29 NOTE — Assessment & Plan Note (Signed)
 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 06/18/2023: Left breast biopsy 2:30 position: Grade 2 IDC with DCIS ER 75%, PR 75%, Ki67 20%, HER2 1+ negative   Treatment plan: Biopsy on the 5 cm of enhancement: Grade 2 IDC, ER 75%, PR 75%, Ki 67: 20%, HER 2 1+ Neg CT scans and bone scan: 06/18/23:Left breast mass 2.2 cm, prom axillary LN, no distant mets, non specific pancreatic head/ uncinate mass 07/02/2023: Oncotype DX: 30 (distant recurrence at 9 years: 19%) Neoadjuvant chemotherapy with dose dense Adriamycin  and Cytoxan  x 4 followed by Taxol  x 12 started 07/21/2023 Mastectomy versus lumpectomy (patient wishes to do bilateral breast reductions if possible): Will be determined by Dr. Afton Horse Adjuvant radiation Adjuvant antiestrogen therapy ------------------------------------------------------------------------------------------------------------------------------------ Current treatment: Completed 4 cycles of neoadjuvant dose dense Adriamycin  Cytoxan , today is cycle 3 Taxol  07/09/2023: Echo: EF 60 to 65% Chemo toxicities: Constipation: Taking 2 stool softeners a day and this is managed without any difficulty Chemo-induced anemia: Stable Persistent cough: I prescribed albuterol for her to take in the evening to open up her airways.  I also instructed her to take the Protonix  first thing in the morning on an empty stomach regularly.  She will try these 2 things and if the cough is still persistent next week she may need a chest x-ray. Labs are stable and within parameters for her to proceed with treatment today with no dose modifications or  adjustments.  We will see Kelsey Carlson back in 1 week for labs, follow-up, and her next treatment

## 2023-10-06 ENCOUNTER — Inpatient Hospital Stay

## 2023-10-06 ENCOUNTER — Inpatient Hospital Stay (HOSPITAL_BASED_OUTPATIENT_CLINIC_OR_DEPARTMENT_OTHER): Admitting: Adult Health

## 2023-10-06 ENCOUNTER — Encounter: Payer: Self-pay | Admitting: Adult Health

## 2023-10-06 VITALS — BP 115/67 | HR 79 | Temp 98.4°F | Resp 18 | Ht 64.0 in | Wt 185.2 lb

## 2023-10-06 VITALS — BP 121/78 | HR 90 | Temp 97.9°F | Resp 16

## 2023-10-06 DIAGNOSIS — C50812 Malignant neoplasm of overlapping sites of left female breast: Secondary | ICD-10-CM

## 2023-10-06 DIAGNOSIS — Z170 Estrogen receptor positive status [ER+]: Secondary | ICD-10-CM

## 2023-10-06 DIAGNOSIS — Z5111 Encounter for antineoplastic chemotherapy: Secondary | ICD-10-CM | POA: Diagnosis not present

## 2023-10-06 DIAGNOSIS — Z95828 Presence of other vascular implants and grafts: Secondary | ICD-10-CM

## 2023-10-06 LAB — CMP (CANCER CENTER ONLY)
ALT: 30 U/L (ref 0–44)
AST: 20 U/L (ref 15–41)
Albumin: 4 g/dL (ref 3.5–5.0)
Alkaline Phosphatase: 57 U/L (ref 38–126)
Anion gap: 4 — ABNORMAL LOW (ref 5–15)
BUN: 7 mg/dL (ref 6–20)
CO2: 27 mmol/L (ref 22–32)
Calcium: 9.2 mg/dL (ref 8.9–10.3)
Chloride: 109 mmol/L (ref 98–111)
Creatinine: 0.62 mg/dL (ref 0.44–1.00)
GFR, Estimated: >60 mL/min (ref >60)
Glucose, Bld: 93 mg/dL (ref 70–99)
Potassium: 3.9 mmol/L (ref 3.5–5.1)
Sodium: 140 mmol/L (ref 135–145)
Total Bilirubin: 0.4 mg/dL (ref 0.0–1.2)
Total Protein: 6.3 g/dL — ABNORMAL LOW (ref 6.5–8.1)

## 2023-10-06 LAB — CBC WITH DIFFERENTIAL (CANCER CENTER ONLY)
Abs Immature Granulocytes: 0.1 10*3/uL — ABNORMAL HIGH (ref 0.00–0.07)
Basophils Absolute: 0.1 10*3/uL (ref 0.0–0.1)
Basophils Relative: 1 %
Eosinophils Absolute: 0.3 10*3/uL (ref 0.0–0.5)
Eosinophils Relative: 5 %
HCT: 29.5 % — ABNORMAL LOW (ref 36.0–46.0)
Hemoglobin: 10.1 g/dL — ABNORMAL LOW (ref 12.0–15.0)
Immature Granulocytes: 2 %
Lymphocytes Relative: 18 %
Lymphs Abs: 1 10*3/uL (ref 0.7–4.0)
MCH: 32.9 pg (ref 26.0–34.0)
MCHC: 34.2 g/dL (ref 30.0–36.0)
MCV: 96.1 fL (ref 80.0–100.0)
Monocytes Absolute: 0.7 10*3/uL (ref 0.1–1.0)
Monocytes Relative: 12 %
Neutro Abs: 3.5 10*3/uL (ref 1.7–7.7)
Neutrophils Relative %: 62 %
Platelet Count: 288 10*3/uL (ref 150–400)
RBC: 3.07 MIL/uL — ABNORMAL LOW (ref 3.87–5.11)
RDW: 17.2 % — ABNORMAL HIGH (ref 11.5–15.5)
WBC Count: 5.7 10*3/uL (ref 4.0–10.5)
nRBC: 0 % (ref 0.0–0.2)

## 2023-10-06 MED ORDER — DIPHENHYDRAMINE HCL 50 MG/ML IJ SOLN
25.0000 mg | Freq: Once | INTRAMUSCULAR | Status: AC
Start: 2023-10-06 — End: 2023-10-06
  Administered 2023-10-06: 25 mg via INTRAVENOUS
  Filled 2023-10-06: qty 1

## 2023-10-06 MED ORDER — SODIUM CHLORIDE 0.9 % IV SOLN: INTRAVENOUS | Status: DC

## 2023-10-06 MED ORDER — DEXAMETHASONE SODIUM PHOSPHATE 10 MG/ML IJ SOLN
4.0000 mg | Freq: Once | INTRAMUSCULAR | Status: AC
  Administered 2023-10-06: 4 mg via INTRAVENOUS
  Filled 2023-10-06: qty 1

## 2023-10-06 MED ORDER — SODIUM CHLORIDE 0.9% FLUSH
10.0000 mL | INTRAVENOUS | Status: DC | PRN
  Administered 2023-10-06: 10 mL

## 2023-10-06 MED ORDER — SODIUM CHLORIDE 0.9 % IV SOLN
80.0000 mg/m2 | Freq: Once | INTRAVENOUS | Status: AC
  Administered 2023-10-06: 156 mg via INTRAVENOUS
  Filled 2023-10-06: qty 25.96

## 2023-10-06 MED ORDER — FAMOTIDINE IN NACL 20-0.9 MG/50ML-% IV SOLN
20.0000 mg | Freq: Once | INTRAVENOUS | Status: AC
  Administered 2023-10-06: 20 mg via INTRAVENOUS
  Filled 2023-10-06: qty 50

## 2023-10-06 MED ORDER — SODIUM CHLORIDE 0.9% FLUSH
10.0000 mL | Freq: Once | INTRAVENOUS | Status: AC
  Administered 2023-10-06: 10 mL

## 2023-10-06 MED ORDER — HEPARIN SOD (PORK) LOCK FLUSH 100 UNIT/ML IV SOLN
500.0000 [IU] | Freq: Once | INTRAVENOUS | Status: AC | PRN
  Administered 2023-10-06: 500 [IU]

## 2023-10-06 NOTE — Patient Instructions (Signed)
 [facility] - A DEPT OF [facility]  Discharge Instructions: Thank you for choosing [facility] to provide your oncology and hematology care.   If you have a lab appointment with the Cancer Center, please go directly to the Cancer Center and check in at the registration area.   Wear comfortable clothing and clothing appropriate for easy access to any Portacath or PICC line.   We strive to give you quality time with your provider. You may need to reschedule your appointment if you arrive late (15 or more minutes).  Arriving late affects you and other patients whose appointments are after yours.  Also, if you miss three or more appointments without notifying the office, you may be dismissed from the clinic at the provider's discretion.      For prescription refill requests, have your pharmacy contact our office and allow 72 hours for refills to be completed.    Today you received the following chemotherapy and/or immunotherapy agents paclitaxel      To help prevent nausea and vomiting after your treatment, we encourage you to take your nausea medication as directed.  BELOW ARE SYMPTOMS THAT SHOULD BE REPORTED IMMEDIATELY: *FEVER GREATER THAN 100.4 F (38 C) OR HIGHER *CHILLS OR SWEATING *NAUSEA AND VOMITING THAT IS NOT CONTROLLED WITH YOUR NAUSEA MEDICATION *UNUSUAL SHORTNESS OF BREATH *UNUSUAL BRUISING OR BLEEDING *URINARY PROBLEMS (pain or burning when urinating, or frequent urination) *BOWEL PROBLEMS (unusual diarrhea, constipation, pain near the anus) TENDERNESS IN MOUTH AND THROAT WITH OR WITHOUT PRESENCE OF ULCERS (sore throat, sores in mouth, or a toothache) UNUSUAL RASH, SWELLING OR PAIN  UNUSUAL VAGINAL DISCHARGE OR ITCHING   Items with * indicate a potential emergency and should be followed up as soon as possible or go to the Emergency Department if any problems should occur.  Please show the CHEMOTHERAPY ALERT CARD or IMMUNOTHERAPY  ALERT CARD at check-in to the Emergency Department and triage nurse.  Should you have questions after your visit or need to cancel or reschedule your appointment, please contact [facility] - A DEPT OF [facility]  Dept: [phone]  and follow the prompts.  Office hours are 8:00 a.m. to 4:30 p.m. Monday - Friday. Please note that voicemails left after 4:00 p.m. may not be returned until the following business day.  We are closed weekends and major holidays. You have access to a nurse at all times for urgent questions. Please call the main number to the clinic Dept: [phone] and follow the prompts.   For any non-urgent questions, you may also contact your provider using MyChart. We now offer e-Visits for anyone 38 and older to request care online for non-urgent symptoms. For details visit [email or web address].   Also download the MyChart app! Go to the app store, search "MyChart", open the app, select [place], and log in with your MyChart username and password.

## 2023-10-06 NOTE — Progress Notes (Signed)
 [facility] Cancer Follow up:    Kelsey Novas, MD 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 Asymmetric masslike breast tissue in the lateral left breast measures 2.2 x 1.2 cm, compatible with known primary breast malignancy. 2. Prominent left axillary lymph nodes measure up to 5 mm in short axis, nonspecific consider attention on dedicated ultrasound. 3. No  convincing evidence of metastatic disease in the chest, abdomen or pelvis. 4. Ill-defined hypodensity in the pancreatic head/uncinate process,  nonspecific but favored to reflect fatty infiltration consider attention on follow-up imaging versus further evaluation by pre and postcontrast enhanced MRC. 5. Nonobstructive 3  mm right interpolar renal stone.  BONE SCAN:  IMPRESSION: 1. No evidence for osseous metastatic disease 2. Degenerative uptake at the left hip, feet and ankles.    07/21/2023 -  Neo-Adjuvant Chemotherapy   Neoadjuvant chemotherapy with dose dense Adriamycin  and Cytoxan  x 4 followed by Taxol  x 12 started 07/21/2023      CURRENT THERAPY: weekly taxol   INTERVAL HISTORY:  Kelsey Carlson 58 y.o. female returns for f/u and evaluation prior to receiving week 4 of Taxol .  She is tolerating treatment quite well.  She has mild fatigue that she manages with energy conservation.  She denies any peripheral neuropathy, nausea, vomiting, fever, chills, diarrhea, constipation, mouth sores or ulcerations, or any other concerns.   Patient Active Problem List   Diagnosis Date Noted   Port-A-Cath in place 07/21/2023   Malignant neoplasm of overlapping sites of left female breast (HCC) 06/09/2023   Bilateral hearing loss 12/24/2022   Tinnitus of both ears 06/09/2020   Acute gastritis 08/03/2019   Chronic left hip pain 12/02/2017   Chronic back pain 12/02/2017   Class 1 obesity due to excess calories with body mass index (BMI) of 34.0 to 34.9 in adult 12/08/2014   Varicose veins of both legs with edema 12/08/2014   Insomnia 02/18/2012   MDD (recurrent major depressive disorder) in remission (HCC) 01/16/2012   TRANSAMINASES, SERUM, ELEVATED 05/27/2008   NEPHROLITHIASIS, HX OF 05/23/2008   Sleep apnea 05/30/2007   NECK PAIN, CHRONIC  02/25/2007   Chest pain 02/25/2007    is allergic to chlorhexidine .  MEDICAL HISTORY: Past Medical History:  Diagnosis Date   Arthritis    Gallstones    History of kidney stones    Lumbar back pain 06/28/2013   Nephrolithiasis    Urolithiasis    Wears glasses     SURGICAL HISTORY: Past  Surgical History:  Procedure Laterality Date   ANKLE ARTHRODESIS  1981   left ankle bone tumor-   BLADDER SUSPENSION  2008   sling-cysto   breast biopsy  2002   lt br bx   BREAST CYST EXCISION Left 02/19/2013   Procedure:  EXCISION BREAST MASS;  Surgeon: Andy Bannister A. Cornett, MD;  Location: [facility];  Service: General;  Laterality: Left;   BREAST EXCISIONAL BIOPSY Left 2014   benign   BREAST SURGERY     CHOLECYSTECTOMY N/A 08/27/2017   Procedure: LAPAROSCOPIC CHOLECYSTECTOMY WITH INTRAOPERATIVE CHOLANGIOGRAM;  Surgeon: Caralyn Chandler, MD;  Location: [facility] OR;  Service: General;  Laterality: N/A;   DILATION AND CURETTAGE OF UTERUS     IR IMAGING GUIDED PORT INSERTION  07/15/2023   LUMBAR DISC SURGERY  1997   lumb lam   TUBAL LIGATION     Essure    SOCIAL HISTORY: Social History   Socioeconomic History   Marital status: Married    Spouse name: Not on file   Number of children: Not on file   Years of education: Not on file   Highest education level: Not on file  Occupational History    Employer: [employer]    Comment: Mailroom  Tobacco Use   Smoking status: Former    Current packs/day: 0.00    Types: Cigarettes    Quit date: 05/20/1994    Years since quitting: 29.4   Smokeless tobacco: Never  Vaping Use   Vaping status: Every Day  Substance and Sexual Activity   Alcohol use: Yes    Comment: socially   Drug use: No   Sexual activity: Yes    Birth control/protection: Surgical    Comment: Essure BTL  Other Topics Concern   Not on file  Social History Narrative   Regular exercise- yes 3 x a week      Diet- fruits and veggies   Social Drivers of Health   Financial Resource Strain: Not on file  Food Insecurity: No Food Insecurity (06/11/2023)   Hunger Vital Sign    Worried About Running Out of Food in the Last Year: Never true    Ran Out of Food in the Last Year: Never true  Transportation Needs: No Transportation Needs (06/11/2023)   PRAPARE -  Administrator, Civil Service (Medical): No    Lack of Transportation (Non-Medical): No  Physical Activity: Not on file  Stress: Not on file  Social Connections: Not on file  Intimate Partner Violence: Not At Risk (06/11/2023)   Humiliation, Afraid, Rape, and Kick questionnaire    Fear of Current or Ex-Partner: No    Emotionally Abused: No    Physically Abused: No    Sexually Abused: No    FAMILY HISTORY: Family History  Problem Relation Age of Onset   Breast cancer Mother    Hypertension Father    Prostate cancer Father     Review of Systems  Constitutional:  Positive for fatigue. Negative for appetite change, chills, fever and unexpected weight change.  HENT:   Negative for hearing loss, lump/mass and trouble swallowing.  Eyes:  Negative for eye problems and icterus.  Respiratory:  Negative for chest tightness, cough and shortness of breath.   Cardiovascular:  Negative for chest pain, leg swelling and palpitations.  Gastrointestinal:  Negative for abdominal distention, abdominal pain, constipation, diarrhea, nausea and vomiting.  Endocrine: Negative for hot flashes.  Genitourinary:  Negative for difficulty urinating.   Musculoskeletal:  Negative for arthralgias.  Skin:  Negative for itching and rash.  Neurological:  Negative for dizziness, extremity weakness, headaches and numbness.  Hematological:  Negative for adenopathy. Does not bruise/bleed easily.  Psychiatric/Behavioral:  Negative for depression. The patient is not nervous/anxious.       PHYSICAL EXAMINATION    Vitals:   10/06/23 1121  BP: 115/67  Pulse: 79  Resp: 18  Temp: 98.4 F (36.9 C)  SpO2: 100%    Physical Exam Constitutional:      General: She is not in acute distress.    Appearance: Normal appearance. She is not toxic-appearing.  HENT:     Head: Normocephalic and atraumatic.     Mouth/Throat:     Mouth: Mucous membranes are moist.     Pharynx: Oropharynx is clear. No  oropharyngeal exudate or posterior oropharyngeal erythema.  Eyes:     General: No scleral icterus. Cardiovascular:     Rate and Rhythm: Normal rate and regular rhythm.     Pulses: Normal pulses.     Heart sounds: Normal heart sounds.  Pulmonary:     Effort: Pulmonary effort is normal.     Breath sounds: Normal breath sounds.  Abdominal:     General: Abdomen is flat. Bowel sounds are normal. There is no distension.     Palpations: Abdomen is soft.     Tenderness: There is no abdominal tenderness.  Musculoskeletal:        General: No swelling.     Cervical back: Neck supple.  Lymphadenopathy:     Cervical: No cervical adenopathy.  Skin:    General: Skin is warm and dry.     Findings: No rash.  Neurological:     General: No focal deficit present.     Mental Status: She is alert.  Psychiatric:        Mood and Affect: Mood normal.        Behavior: Behavior normal.     LABORATORY DATA:  CBC    Component Value Date/Time   WBC 5.7 10/06/2023 1046   WBC 7.3 08/19/2017 0912   RBC 3.07 (L) 10/06/2023 1046   HGB 10.1 (L) 10/06/2023 1046   HCT 29.5 (L) 10/06/2023 1046   PLT 288 10/06/2023 1046   MCV 96.1 10/06/2023 1046   MCH 32.9 10/06/2023 1046   MCHC 34.2 10/06/2023 1046   RDW 17.2 (H) 10/06/2023 1046   LYMPHSABS 1.0 10/06/2023 1046   MONOABS 0.7 10/06/2023 1046   EOSABS 0.3 10/06/2023 1046   BASOSABS 0.1 10/06/2023 1046    CMP     Component Value Date/Time   NA 140 10/06/2023 1046   K 3.9 10/06/2023 1046   CL 109 10/06/2023 1046   CO2 27 10/06/2023 1046   GLUCOSE 93 10/06/2023 1046   BUN 7 10/06/2023 1046   CREATININE 0.62 10/06/2023 1046   CALCIUM 9.2 10/06/2023 1046   PROT 6.3 (L) 10/06/2023 1046   ALBUMIN 4.0 10/06/2023 1046   AST 20 10/06/2023 1046   ALT 30 10/06/2023 1046   ALKPHOS 57 10/06/2023 1046   BILITOT 0.4 10/06/2023 1046   GFRNONAA >60 10/06/2023 1046  GFRAA 102 05/23/2008 1054     ASSESSMENT and THERAPY PLAN:   Malignant neoplasm of  overlapping sites of left female breast (HCC) 05/23/2023: Left nipple punch biopsy: Infiltrating carcinoma consistent with breast origin (CK7 GATA3 and ER positive) ER 30%, PR 20%, HER2 1+ (send T4, N0, M0, stage IIIb) 06/02/2023: Breast MRI: 2.8 cm biopsy-proven malignancy left nipple retroareolar left breast, 5 cm enhancement UOQ left breast posterior superior to the biopsy-proven malignancy, highly suspicious left breast enhancement spanning 9.8 cm. 06/18/2023: Left breast biopsy 2:30 position: Grade 2 IDC with DCIS ER 75%, PR 75%, Ki67 20%, HER2 1+ negative   Treatment plan: Biopsy on the 5 cm of enhancement: Grade 2 IDC, ER 75%, PR 75%, Ki 67: 20%, HER 2 1+ Neg CT scans and bone scan: 06/18/23:Left breast mass 2.2 cm, prom axillary LN, no distant mets, non specific pancreatic head/ uncinate mass 07/02/2023: Oncotype DX: 30 (distant recurrence at 9 years: 19%) Neoadjuvant chemotherapy with dose dense Adriamycin  and Cytoxan  x 4 followed by Taxol  x 12 started 07/21/2023 Mastectomy versus lumpectomy (patient wishes to do bilateral breast reductions if possible): Will be determined by Dr. Afton Horse Adjuvant radiation Adjuvant antiestrogen therapy ------------------------------------------------------------------------------------------------------------------------------------ Current treatment: Completed 4 cycles of neoadjuvant dose dense Adriamycin  Cytoxan , today is cycle 4 Taxol  07/09/2023: Echo: EF 60 to 65% Chemo toxicities: Constipation: Managed with stool softeners Chemo-induced anemia: Stable Persistent cough: Protonix  and albuterol   Labs are stable and within parameters for her to proceed with treatment today with no dose modifications or adjustments.  We will see Kelsey Carlson back in 1 week for labs, follow-up, and her next treatment  All questions were answered. The patient knows to call the clinic with any problems, questions or concerns. We can certainly see the patient much sooner if  necessary.  Total encounter time:20 minutes*in face-to-face visit time, chart review, lab review, care coordination, order entry, and documentation of the encounter time.    Alwin Baars, NP 10/06/23 12:06 PM Medical Oncology and Hematology [facility] [address] [place], [place] [address] Tel. [phone]    Fax. [phone]  *Total Encounter Time as defined by the Centers for Medicare and Medicaid Services includes, in addition to the face-to-face time of a patient visit (documented in the note above) non-face-to-face time: obtaining and reviewing outside history, ordering and reviewing medications, tests or procedures, care coordination (communications with other health care professionals or caregivers) and documentation in the medical record.

## 2023-10-06 NOTE — Assessment & Plan Note (Signed)
 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 06/18/2023: Left breast biopsy 2:30 position: Grade 2 IDC with DCIS ER 75%, PR 75%, Ki67 20%, HER2 1+ negative   Treatment plan: Biopsy on the 5 cm of enhancement: Grade 2 IDC, ER 75%, PR 75%, Ki 67: 20%, HER 2 1+ Neg CT scans and bone scan: 06/18/23:Left breast mass 2.2 cm, prom axillary LN, no distant mets, non specific pancreatic head/ uncinate mass 07/02/2023: Oncotype DX: 30 (distant recurrence at 9 years: 19%) Neoadjuvant chemotherapy with dose dense Adriamycin  and Cytoxan  x 4 followed by Taxol  x 12 started 07/21/2023 Mastectomy versus lumpectomy (patient wishes to do bilateral breast reductions if possible): Will be determined by Dr. Afton Horse Adjuvant radiation Adjuvant antiestrogen therapy ------------------------------------------------------------------------------------------------------------------------------------ Current treatment: Completed 4 cycles of neoadjuvant dose dense Adriamycin  Cytoxan , today is cycle 4 Taxol  07/09/2023: Echo: EF 60 to 65% Chemo toxicities: Constipation: Managed with stool softeners Chemo-induced anemia: Stable Persistent cough: Protonix  and albuterol   Labs are stable and within parameters for her to proceed with treatment today with no dose modifications or adjustments.  We will see Jordin back in 1 week for labs, follow-up, and her next treatment

## 2023-10-14 ENCOUNTER — Inpatient Hospital Stay

## 2023-10-14 ENCOUNTER — Inpatient Hospital Stay (HOSPITAL_BASED_OUTPATIENT_CLINIC_OR_DEPARTMENT_OTHER): Admitting: Hematology and Oncology

## 2023-10-14 ENCOUNTER — Encounter: Payer: Self-pay | Admitting: Hematology and Oncology

## 2023-10-14 ENCOUNTER — Encounter: Payer: Self-pay | Admitting: *Deleted

## 2023-10-14 VITALS — BP 125/66 | HR 89 | Temp 98.7°F | Resp 18 | Ht 64.0 in | Wt 183.2 lb

## 2023-10-14 DIAGNOSIS — C50812 Malignant neoplasm of overlapping sites of left female breast: Secondary | ICD-10-CM

## 2023-10-14 DIAGNOSIS — Z95828 Presence of other vascular implants and grafts: Secondary | ICD-10-CM

## 2023-10-14 DIAGNOSIS — Z170 Estrogen receptor positive status [ER+]: Secondary | ICD-10-CM

## 2023-10-14 DIAGNOSIS — Z5111 Encounter for antineoplastic chemotherapy: Secondary | ICD-10-CM | POA: Diagnosis not present

## 2023-10-14 LAB — CBC WITH DIFFERENTIAL (CANCER CENTER ONLY)
Abs Immature Granulocytes: 0.07 10*3/uL (ref 0.00–0.07)
Basophils Absolute: 0.1 10*3/uL (ref 0.0–0.1)
Basophils Relative: 1 %
Eosinophils Absolute: 0.2 10*3/uL (ref 0.0–0.5)
Eosinophils Relative: 3 %
HCT: 29.1 % — ABNORMAL LOW (ref 36.0–46.0)
Hemoglobin: 10 g/dL — ABNORMAL LOW (ref 12.0–15.0)
Immature Granulocytes: 1 %
Lymphocytes Relative: 13 %
Lymphs Abs: 0.8 10*3/uL (ref 0.7–4.0)
MCH: 33.3 pg (ref 26.0–34.0)
MCHC: 34.4 g/dL (ref 30.0–36.0)
MCV: 97 fL (ref 80.0–100.0)
Monocytes Absolute: 1 10*3/uL (ref 0.1–1.0)
Monocytes Relative: 17 %
Neutro Abs: 3.7 10*3/uL (ref 1.7–7.7)
Neutrophils Relative %: 65 %
Platelet Count: 289 10*3/uL (ref 150–400)
RBC: 3 MIL/uL — ABNORMAL LOW (ref 3.87–5.11)
RDW: 15.8 % — ABNORMAL HIGH (ref 11.5–15.5)
WBC Count: 5.7 10*3/uL (ref 4.0–10.5)
nRBC: 0 % (ref 0.0–0.2)

## 2023-10-14 LAB — CMP (CANCER CENTER ONLY)
ALT: 26 U/L (ref 0–44)
AST: 20 U/L (ref 15–41)
Albumin: 4 g/dL (ref 3.5–5.0)
Alkaline Phosphatase: 69 U/L (ref 38–126)
Anion gap: 6 (ref 5–15)
BUN: 8 mg/dL (ref 6–20)
CO2: 25 mmol/L (ref 22–32)
Calcium: 9 mg/dL (ref 8.9–10.3)
Chloride: 108 mmol/L (ref 98–111)
Creatinine: 0.64 mg/dL (ref 0.44–1.00)
GFR, Estimated: >60 mL/min (ref >60)
Glucose, Bld: 94 mg/dL (ref 70–99)
Potassium: 3.9 mmol/L (ref 3.5–5.1)
Sodium: 139 mmol/L (ref 135–145)
Total Bilirubin: 0.4 mg/dL (ref 0.0–1.2)
Total Protein: 6.3 g/dL — ABNORMAL LOW (ref 6.5–8.1)

## 2023-10-14 MED ORDER — HEPARIN SOD (PORK) LOCK FLUSH 100 UNIT/ML IV SOLN
500.0000 [IU] | Freq: Once | INTRAVENOUS | Status: DC | PRN
Start: 2023-10-14 — End: 2023-10-14

## 2023-10-14 MED ORDER — SODIUM CHLORIDE 0.9 % IV SOLN
80.0000 mg/m2 | Freq: Once | INTRAVENOUS | Status: AC
  Administered 2023-10-14: 156 mg via INTRAVENOUS
  Filled 2023-10-14: qty 26

## 2023-10-14 MED ORDER — FAMOTIDINE IN NACL 20-0.9 MG/50ML-% IV SOLN
20.0000 mg | Freq: Once | INTRAVENOUS | Status: AC
  Administered 2023-10-14: 20 mg via INTRAVENOUS
  Filled 2023-10-14: qty 50

## 2023-10-14 MED ORDER — SODIUM CHLORIDE 0.9% FLUSH
10.0000 mL | Freq: Once | INTRAVENOUS | Status: AC
  Administered 2023-10-14: 10 mL

## 2023-10-14 MED ORDER — SODIUM CHLORIDE 0.9% FLUSH: 10.0000 mL | INTRAVENOUS | Status: DC | PRN

## 2023-10-14 MED ORDER — DIPHENHYDRAMINE HCL 50 MG/ML IJ SOLN
25.0000 mg | Freq: Once | INTRAMUSCULAR | Status: AC
  Administered 2023-10-14: 25 mg via INTRAVENOUS
  Filled 2023-10-14: qty 1

## 2023-10-14 MED ORDER — DEXAMETHASONE SODIUM PHOSPHATE 10 MG/ML IJ SOLN
4.0000 mg | Freq: Once | INTRAMUSCULAR | Status: AC
  Administered 2023-10-14: 4 mg via INTRAVENOUS
  Filled 2023-10-14: qty 1

## 2023-10-14 MED ORDER — SODIUM CHLORIDE 0.9 % IV SOLN: INTRAVENOUS | Status: DC

## 2023-10-14 NOTE — Assessment & Plan Note (Signed)
 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 06/18/2023: Left breast biopsy 2:30 position: Grade 2 IDC with DCIS ER 75%, PR 75%, Ki67 20%, HER2 1+ negative   Treatment plan: Biopsy on the 5 cm of enhancement: Grade 2 IDC, ER 75%, PR 75%, Ki 67: 20%, HER 2 1+ Neg CT scans and bone scan: 06/18/23:Left breast mass 2.2 cm, prom axillary LN, no distant mets, non specific pancreatic head/ uncinate mass 07/02/2023: Oncotype DX: 30 (distant recurrence at 9 years: 19%) Neoadjuvant chemotherapy with dose dense Adriamycin  and Cytoxan  x 4 followed by Taxol  x 12 started 07/21/2023 Mastectomy versus lumpectomy (patient wishes to do bilateral breast reductions if possible): Will be determined by Dr. Afton Horse Adjuvant radiation Adjuvant antiestrogen therapy ------------------------------------------------------------------------------------------------------------------------------------ Current treatment: Completed 4 cycles of neoadjuvant dose dense Adriamycin  Cytoxan , today is cycle 5 Taxol  07/09/2023: Echo: EF 60 to 65% Chemo toxicities: Constipation: Managed with stool softeners Chemo-induced anemia: Stable Persistent cough: Protonix  and albuterol    Labs are stable and within parameters for her to proceed with treatment today with no dose modifications or adjustments.   We will see Jalilah back in 1 week for labs, follow-up, and her next treatment

## 2023-10-14 NOTE — Patient Instructions (Signed)
 [facility] - A DEPT OF [facility]  Discharge Instructions: Thank you for choosing [facility] to provide your oncology and hematology care.   If you have a lab appointment with the Cancer Center, please go directly to the Cancer Center and check in at the registration area.   Wear comfortable clothing and clothing appropriate for easy access to any Portacath or PICC line.   We strive to give you quality time with your provider. You may need to reschedule your appointment if you arrive late (15 or more minutes).  Arriving late affects you and other patients whose appointments are after yours.  Also, if you miss three or more appointments without notifying the office, you may be dismissed from the clinic at the provider's discretion.      For prescription refill requests, have your pharmacy contact our office and allow 72 hours for refills to be completed.    Today you received the following chemotherapy and/or immunotherapy agent: Paclitaxel (Taxol).      To help prevent nausea and vomiting after your treatment, we encourage you to take your nausea medication as directed.  BELOW ARE SYMPTOMS THAT SHOULD BE REPORTED IMMEDIATELY: *FEVER GREATER THAN 100.4 F (38 C) OR HIGHER *CHILLS OR SWEATING *NAUSEA AND VOMITING THAT IS NOT CONTROLLED WITH YOUR NAUSEA MEDICATION *UNUSUAL SHORTNESS OF BREATH *UNUSUAL BRUISING OR BLEEDING *URINARY PROBLEMS (pain or burning when urinating, or frequent urination) *BOWEL PROBLEMS (unusual diarrhea, constipation, pain near the anus) TENDERNESS IN MOUTH AND THROAT WITH OR WITHOUT PRESENCE OF ULCERS (sore throat, sores in mouth, or a toothache) UNUSUAL RASH, SWELLING OR PAIN  UNUSUAL VAGINAL DISCHARGE OR ITCHING   Items with * indicate a potential emergency and should be followed up as soon as possible or go to the Emergency Department if any problems should occur.  Please show the CHEMOTHERAPY ALERT CARD or  IMMUNOTHERAPY ALERT CARD at check-in to the Emergency Department and triage nurse.  Should you have questions after your visit or need to cancel or reschedule your appointment, please contact [facility] - A DEPT OF [facility]  Dept: [phone]  and follow the prompts.  Office hours are 8:00 a.m. to 4:30 p.m. Monday - Friday. Please note that voicemails left after 4:00 p.m. may not be returned until the following business day.  We are closed weekends and major holidays. You have access to a nurse at all times for urgent questions. Please call the main number to the clinic Dept: [phone] and follow the prompts.   For any non-urgent questions, you may also contact your provider using MyChart. We now offer e-Visits for anyone 108 and older to request care online for non-urgent symptoms. For details visit [email or web address].   Also download the MyChart app! Go to the app store, search "MyChart", open the app, select [facility], and log in with your MyChart username and password.

## 2023-10-14 NOTE — Progress Notes (Signed)
 Patient Care Team: Judithann Novas, MD as PCP - General Alane Hsu, RN as Oncology Nurse Navigator Auther Bo, RN as Oncology Nurse Navigator Cameron Cea, MD as Consulting Physician (Hematology and Oncology) Sim Dryer, MD as Consulting Physician (General Surgery) Johna Myers, MD as Consulting Physician (Radiation Oncology)  DIAGNOSIS:  Encounter Diagnosis  Name Primary?   Malignant neoplasm of overlapping sites of left breast in female, estrogen receptor positive (HCC) Yes    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)  05/23/2023 Initial Biopsy   Left nipple punch biopsy: Infiltrating carcinoma consistent with breast origin (CK7 GATA3 and ER positive) ER 30%, PR 20%, HER2 1+ (send T4, N0, M0, stage IIIb)    06/02/2023 Breast MRI   Breast MRI: 2.8 cm biopsy-proven malignancy left nipple retroareolar left breast, 5 cm enhancement UOQ left breast posterior superior to the biopsy-proven malignancy, highly suspicious left breast enhancement spanning 9.8 cm.    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 Asymmetric masslike breast tissue in the lateral left breast measures 2.2 x 1.2 cm, compatible with known primary breast malignancy. 2. Prominent left axillary lymph nodes measure up to 5 mm in short axis, nonspecific consider attention on dedicated ultrasound. 3. No  convincing evidence of metastatic disease in the chest,  abdomen or pelvis. 4. Ill-defined hypodensity in the pancreatic head/uncinate process, nonspecific but favored to reflect fatty infiltration consider attention on follow-up imaging versus further evaluation by pre and postcontrast enhanced MRC. 5. Nonobstructive 3  mm right interpolar renal stone.  BONE SCAN:  IMPRESSION: 1. No evidence for osseous metastatic disease 2. Degenerative uptake at the left hip, feet and ankles.    07/21/2023 -  Neo-Adjuvant Chemotherapy   Neoadjuvant chemotherapy with dose dense Adriamycin  and Cytoxan  x 4 followed by Taxol  x 12 started 07/21/2023      CHIEF COMPLIANT:   HISTORY OF PRESENT ILLNESS:  History of Present Illness Kelsey Carlson is a 58 year old female who presents with nasal bleeding and neuropathy symptoms at the tips of her fingers especially the thumb.  She experiences frequent epistaxis and uses a humidifier to alleviate dryness. Neuropathy symptoms include tingling in her feet and tightness and soreness in her fingernails, especially in her thumbs. Her fingers feel swollen, and her rings become tight in warm weather, possibly related to heat or high salt intake. She uses compression gloves to manage these symptoms.     ALLERGIES:  is allergic to chlorhexidine .  MEDICATIONS:  Current Outpatient Medications  Medication Sig Dispense Refill   pantoprazole  (PROTONIX ) 40 MG tablet TAKE 1 TABLET BY MOUTH EVERY DAY 90 tablet 1   albuterol  (VENTOLIN  HFA) 108 (90 Base) MCG/ACT inhaler Inhale 2 puffs into the lungs every 6 (six) hours as needed for wheezing or shortness of breath. (Patient not taking: Reported on 10/14/2023) 8 g 2   No current facility-administered medications for this visit.    PHYSICAL EXAMINATION: ECOG  PERFORMANCE STATUS: 1 - Symptomatic but completely ambulatory  Vitals:   10/14/23 1046  BP: 125/66  Pulse: 89  Resp: 18  Temp: 98.7 F (37.1 C)  SpO2: 100%   Filed Weights   10/14/23 1046  Weight: 183 lb 3.2 oz (83.1  kg)    Physical Exam HEENT: Throat normal   LABORATORY DATA:  I have reviewed the data as listed    Latest Ref Rng & Units 10/14/2023   10:12 AM 10/06/2023   10:46 AM 09/29/2023    9:57 AM  CMP  Glucose 70 - 99 mg/dL 94  93  97   BUN 6 - 20 mg/dL 8  7  9    Creatinine 0.44 - 1.00 mg/dL 0.98  1.19  1.47   Sodium 135 - 145 mmol/L 139  140  141   Potassium 3.5 - 5.1 mmol/L 3.9  3.9  3.9   Chloride 98 - 111 mmol/L 108  109  110   CO2 22 - 32 mmol/L 25  27  27    Calcium 8.9 - 10.3 mg/dL 9.0  9.2  9.1   Total Protein 6.5 - 8.1 g/dL 6.3  6.3  6.1   Total Bilirubin 0.0 - 1.2 mg/dL 0.4  0.4  0.4   Alkaline Phos 38 - 126 U/L 69  57  65   AST 15 - 41 U/L 20  20  20    ALT 0 - 44 U/L 26  30  32     Lab Results  Component Value Date   WBC 5.7 10/14/2023   HGB 10.0 (L) 10/14/2023   HCT 29.1 (L) 10/14/2023   MCV 97.0 10/14/2023   PLT 289 10/14/2023   NEUTROABS 3.7 10/14/2023    ASSESSMENT & PLAN:  Malignant neoplasm of overlapping sites of left female breast (HCC) 05/23/2023: Left nipple punch biopsy: Infiltrating carcinoma consistent with breast origin (CK7 GATA3 and ER positive) ER 30%, PR 20%, HER2 1+ (send T4, N0, M0, stage IIIb) 06/02/2023: Breast MRI: 2.8 cm biopsy-proven malignancy left nipple retroareolar left breast, 5 cm enhancement UOQ left breast posterior superior to the biopsy-proven malignancy, highly suspicious left breast enhancement spanning 9.8 cm. 06/18/2023: Left breast biopsy 2:30 position: Grade 2 IDC with DCIS ER 75%, PR 75%, Ki67 20%, HER2 1+ negative   Treatment plan: Biopsy on the 5 cm of enhancement: Grade 2 IDC, ER 75%, PR 75%, Ki 67: 20%, HER 2 1+ Neg CT scans and bone scan: 06/18/23:Left breast mass 2.2 cm, prom axillary LN, no distant mets, non specific pancreatic head/ uncinate mass 07/02/2023: Oncotype DX: 30 (distant recurrence at 9 years: 19%) Neoadjuvant chemotherapy with dose dense Adriamycin  and Cytoxan  x 4 followed by Taxol  x 12 started  07/21/2023 Mastectomy versus lumpectomy (patient wishes to do bilateral breast reductions if possible): Will be determined by Dr. Afton Horse Adjuvant radiation Adjuvant antiestrogen therapy ------------------------------------------------------------------------------------------------------------------------------------ Current treatment: Completed 4 cycles of neoadjuvant dose dense Adriamycin  Cytoxan , today is cycle 5 Taxol  07/09/2023: Echo: EF 60 to 65% Chemo toxicities: Constipation: Managed with stool softeners Chemo-induced anemia: Stable Persistent cough: Protonix  and albuterol  Mild peripheral neuropathy especially in the thumb: Monitoring closely Nasal dryness and epistaxis Sore on the bottom lip   Labs are stable and within parameters for her to proceed with treatment today with no dose modifications or adjustments.   We will see Kelsey Carlson back in 1 week for labs, follow-up, and her next treatment ------------------------------------- Assessment and Plan Assessment & Plan Chemotherapy-induced peripheral neuropathy Mild neuropathy with tingling in feet  and soreness in thumb and fingernails, exacerbated by heat and possibly diet. Symptoms do not warrant chemotherapy dosage change. - Monitor symptoms. Consider dosage reduction or discontinuation if symptoms worsen.  Nasal dryness and bleeding Epistaxis due to nasal dryness and lack of nasal hair, likely exacerbated by chemotherapy. - Use cold mist humidifier regularly. - Apply saline spray to nostrils as needed. - Consider using a Q-tip with Vaseline for additional moisture.  Persistent cough Persistent cough likely due to throat dryness. No fever or difficulty swallowing. Throat examination shows soreness without significant abnormalities. - Consider throat lozenges or humidifier for throat dryness.      No orders of the defined types were placed in this encounter.  The patient has a good understanding of the overall plan. she  agrees with it. she will call with any problems that may develop before the next visit here. Total time spent: 30 mins including face to face time and time spent for planning, charting and co-ordination of care   Viinay K Nakita Santerre, MD 10/14/23

## 2023-10-20 ENCOUNTER — Inpatient Hospital Stay (HOSPITAL_BASED_OUTPATIENT_CLINIC_OR_DEPARTMENT_OTHER): Admitting: Hematology and Oncology

## 2023-10-20 ENCOUNTER — Inpatient Hospital Stay

## 2023-10-20 ENCOUNTER — Inpatient Hospital Stay: Attending: Hematology and Oncology

## 2023-10-20 VITALS — BP 118/77 | HR 93 | Temp 98.0°F | Resp 16

## 2023-10-20 VITALS — BP 115/76 | HR 104 | Temp 98.3°F | Resp 18 | Ht 64.0 in | Wt 181.3 lb

## 2023-10-20 DIAGNOSIS — Z79899 Other long term (current) drug therapy: Secondary | ICD-10-CM | POA: Insufficient documentation

## 2023-10-20 DIAGNOSIS — Z5111 Encounter for antineoplastic chemotherapy: Secondary | ICD-10-CM | POA: Insufficient documentation

## 2023-10-20 DIAGNOSIS — K5900 Constipation, unspecified: Secondary | ICD-10-CM | POA: Diagnosis not present

## 2023-10-20 DIAGNOSIS — C50812 Malignant neoplasm of overlapping sites of left female breast: Secondary | ICD-10-CM

## 2023-10-20 DIAGNOSIS — R059 Cough, unspecified: Secondary | ICD-10-CM | POA: Diagnosis not present

## 2023-10-20 DIAGNOSIS — Z170 Estrogen receptor positive status [ER+]: Secondary | ICD-10-CM

## 2023-10-20 DIAGNOSIS — D6481 Anemia due to antineoplastic chemotherapy: Secondary | ICD-10-CM | POA: Diagnosis not present

## 2023-10-20 DIAGNOSIS — G629 Polyneuropathy, unspecified: Secondary | ICD-10-CM | POA: Insufficient documentation

## 2023-10-20 DIAGNOSIS — N200 Calculus of kidney: Secondary | ICD-10-CM | POA: Insufficient documentation

## 2023-10-20 DIAGNOSIS — Z95828 Presence of other vascular implants and grafts: Secondary | ICD-10-CM

## 2023-10-20 DIAGNOSIS — T451X5A Adverse effect of antineoplastic and immunosuppressive drugs, initial encounter: Secondary | ICD-10-CM | POA: Insufficient documentation

## 2023-10-20 LAB — CBC WITH DIFFERENTIAL (CANCER CENTER ONLY)
Abs Immature Granulocytes: 0.05 10*3/uL (ref 0.00–0.07)
Basophils Absolute: 0 10*3/uL (ref 0.0–0.1)
Basophils Relative: 1 %
Eosinophils Absolute: 0.2 10*3/uL (ref 0.0–0.5)
Eosinophils Relative: 5 %
HCT: 28.7 % — ABNORMAL LOW (ref 36.0–46.0)
Hemoglobin: 9.7 g/dL — ABNORMAL LOW (ref 12.0–15.0)
Immature Granulocytes: 1 %
Lymphocytes Relative: 13 %
Lymphs Abs: 0.5 10*3/uL — ABNORMAL LOW (ref 0.7–4.0)
MCH: 32.9 pg (ref 26.0–34.0)
MCHC: 33.8 g/dL (ref 30.0–36.0)
MCV: 97.3 fL (ref 80.0–100.0)
Monocytes Absolute: 0.5 10*3/uL (ref 0.1–1.0)
Monocytes Relative: 12 %
Neutro Abs: 2.8 10*3/uL (ref 1.7–7.7)
Neutrophils Relative %: 68 %
Platelet Count: 329 10*3/uL (ref 150–400)
RBC: 2.95 MIL/uL — ABNORMAL LOW (ref 3.87–5.11)
RDW: 14.6 % (ref 11.5–15.5)
WBC Count: 4.1 10*3/uL (ref 4.0–10.5)
nRBC: 0 % (ref 0.0–0.2)

## 2023-10-20 LAB — CMP (CANCER CENTER ONLY)
ALT: 20 U/L (ref 0–44)
AST: 17 U/L (ref 15–41)
Albumin: 3.7 g/dL (ref 3.5–5.0)
Alkaline Phosphatase: 73 U/L (ref 38–126)
Anion gap: 5 (ref 5–15)
BUN: 6 mg/dL (ref 6–20)
CO2: 26 mmol/L (ref 22–32)
Calcium: 8.9 mg/dL (ref 8.9–10.3)
Chloride: 107 mmol/L (ref 98–111)
Creatinine: 0.66 mg/dL (ref 0.44–1.00)
GFR, Estimated: >60 mL/min (ref >60)
Glucose, Bld: 156 mg/dL — ABNORMAL HIGH (ref 70–99)
Potassium: 3.7 mmol/L (ref 3.5–5.1)
Sodium: 138 mmol/L (ref 135–145)
Total Bilirubin: 0.5 mg/dL (ref 0.0–1.2)
Total Protein: 6 g/dL — ABNORMAL LOW (ref 6.5–8.1)

## 2023-10-20 MED ORDER — SODIUM CHLORIDE 0.9 % IV SOLN
65.0000 mg/m2 | Freq: Once | INTRAVENOUS | Status: AC
  Administered 2023-10-20: 126 mg via INTRAVENOUS
  Filled 2023-10-20 (×3): qty 21

## 2023-10-20 MED ORDER — SODIUM CHLORIDE 0.9% FLUSH
10.0000 mL | INTRAVENOUS | Status: DC | PRN
  Administered 2023-10-20: 10 mL

## 2023-10-20 MED ORDER — SODIUM CHLORIDE 0.9% FLUSH
10.0000 mL | Freq: Once | INTRAVENOUS | Status: AC
  Administered 2023-10-20: 10 mL

## 2023-10-20 MED ORDER — DIPHENHYDRAMINE HCL 50 MG/ML IJ SOLN
25.0000 mg | Freq: Once | INTRAMUSCULAR | Status: AC
  Administered 2023-10-20: 25 mg via INTRAVENOUS
  Filled 2023-10-20: qty 1

## 2023-10-20 MED ORDER — HEPARIN SOD (PORK) LOCK FLUSH 100 UNIT/ML IV SOLN
500.0000 [IU] | Freq: Once | INTRAVENOUS | Status: AC | PRN
  Administered 2023-10-20: 500 [IU]

## 2023-10-20 MED ORDER — SODIUM CHLORIDE 0.9 % IV SOLN: INTRAVENOUS | Status: DC

## 2023-10-20 MED ORDER — FAMOTIDINE IN NACL 20-0.9 MG/50ML-% IV SOLN
20.0000 mg | Freq: Once | INTRAVENOUS | Status: AC
  Administered 2023-10-20: 20 mg via INTRAVENOUS
  Filled 2023-10-20: qty 50

## 2023-10-20 MED ORDER — DEXAMETHASONE SODIUM PHOSPHATE 10 MG/ML IJ SOLN
4.0000 mg | Freq: Once | INTRAMUSCULAR | Status: AC
  Administered 2023-10-20: 4 mg via INTRAVENOUS
  Filled 2023-10-20: qty 1

## 2023-10-20 NOTE — Progress Notes (Signed)
 Patient Care Team: Judithann Novas, MD as PCP - General Alane Hsu, RN as Oncology Nurse Navigator Auther Bo, RN as Oncology Nurse Navigator Cameron Cea, MD as Consulting Physician (Hematology and Oncology) Sim Dryer, MD as Consulting Physician (General Surgery) Johna Myers, MD as Consulting Physician (Radiation Oncology)  DIAGNOSIS:  Encounter Diagnosis  Name Primary?   Malignant neoplasm of overlapping sites of left breast in female, estrogen receptor positive (HCC) Yes    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)  05/23/2023 Initial Biopsy   Left nipple punch biopsy: Infiltrating carcinoma consistent with breast origin (CK7 GATA3 and ER positive) ER 30%, PR 20%, HER2 1+ (send T4, N0, M0, stage IIIb)    06/02/2023 Breast MRI   Breast MRI: 2.8 cm biopsy-proven malignancy left nipple retroareolar left breast, 5 cm enhancement UOQ left breast posterior superior to the biopsy-proven malignancy, highly suspicious left breast enhancement spanning 9.8 cm.    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 Asymmetric masslike breast tissue in the lateral left breast measures 2.2 x 1.2 cm, compatible with known primary breast malignancy. 2. Prominent left axillary lymph nodes measure up to 5 mm in short axis, nonspecific consider attention on dedicated ultrasound. 3. No  convincing evidence of metastatic disease in the chest,  abdomen or pelvis. 4. Ill-defined hypodensity in the pancreatic head/uncinate process, nonspecific but favored to reflect fatty infiltration consider attention on follow-up imaging versus further evaluation by pre and postcontrast enhanced MRC. 5. Nonobstructive 3  mm right interpolar renal stone.  BONE SCAN:  IMPRESSION: 1. No evidence for osseous metastatic disease 2. Degenerative uptake at the left hip, feet and ankles.    07/21/2023 -  Neo-Adjuvant Chemotherapy   Neoadjuvant chemotherapy with dose dense Adriamycin  and Cytoxan  x 4 followed by Taxol  x 12 started 07/21/2023      CHIEF COMPLIANT:   HISTORY OF PRESENT ILLNESS:   History of Present Illness Kelsey Carlson is a 58 year old female undergoing chemotherapy who presents with neuropathy and allergy symptoms.  She experiences intermittent neuropathy in her fingers, worsened by activities like opening objects. This neuropathy is a side effect of her chemotherapy.  Her hemoglobin levels range from 9.5 to 10, with the latest being 9.7, related to chemotherapy-induced anemia. She inquires about taking vitamins.  She has significant allergy symptoms due to environmental factors but has not taken any medication due to concerns about interactions with chemotherapy. She has access to Zyrtec, Claritin, and Allegra.  Occasionally, she feels achy and sore, effectively managed with Tylenol .     ALLERGIES:  is allergic to chlorhexidine .  MEDICATIONS:  Current Outpatient Medications  Medication Sig Dispense Refill   pantoprazole  (PROTONIX ) 40 MG tablet TAKE 1 TABLET BY MOUTH EVERY DAY 90 tablet 1   albuterol  (VENTOLIN  HFA) 108 (90 Base) MCG/ACT inhaler Inhale 2 puffs into the lungs every 6 (six) hours as needed for wheezing or shortness of breath. (Patient  not taking: Reported on 10/20/2023) 8 g 2   No current facility-administered medications for this visit.    PHYSICAL EXAMINATION: ECOG PERFORMANCE STATUS: 1 - Symptomatic but  completely ambulatory  There were no vitals filed for this visit. There were no vitals filed for this visit.  Physical Exam   (exam performed in the presence of a chaperone)  LABORATORY DATA:  I have reviewed the data as listed    Latest Ref Rng & Units 10/14/2023   10:12 AM 10/06/2023   10:46 AM 09/29/2023    9:57 AM  CMP  Glucose 70 - 99 mg/dL 94  93  97   BUN 6 - 20 mg/dL 8  7  9    Creatinine 0.44 - 1.00 mg/dL 8.29  5.62  1.30   Sodium 135 - 145 mmol/L 139  140  141   Potassium 3.5 - 5.1 mmol/L 3.9  3.9  3.9   Chloride 98 - 111 mmol/L 108  109  110   CO2 22 - 32 mmol/L 25  27  27    Calcium 8.9 - 10.3 mg/dL 9.0  9.2  9.1   Total Protein 6.5 - 8.1 g/dL 6.3  6.3  6.1   Total Bilirubin 0.0 - 1.2 mg/dL 0.4  0.4  0.4   Alkaline Phos 38 - 126 U/L 69  57  65   AST 15 - 41 U/L 20  20  20    ALT 0 - 44 U/L 26  30  32     Lab Results  Component Value Date   WBC 4.1 10/20/2023   HGB 9.7 (L) 10/20/2023   HCT 28.7 (L) 10/20/2023   MCV 97.3 10/20/2023   PLT 329 10/20/2023   NEUTROABS 2.8 10/20/2023    ASSESSMENT & PLAN:  Malignant neoplasm of overlapping sites of left female breast (HCC) 05/23/2023: Left nipple punch biopsy: Infiltrating carcinoma consistent with breast origin (CK7 GATA3 and ER positive) ER 30%, PR 20%, HER2 1+ (send T4, N0, M0, stage IIIb) 06/02/2023: Breast MRI: 2.8 cm biopsy-proven malignancy left nipple retroareolar left breast, 5 cm enhancement UOQ left breast posterior superior to the biopsy-proven malignancy, highly suspicious left breast enhancement spanning 9.8 cm. 06/18/2023: Left breast biopsy 2:30 position: Grade 2 IDC with DCIS ER 75%, PR 75%, Ki67 20%, HER2 1+ negative   Treatment plan: Biopsy on the 5 cm of enhancement: Grade 2 IDC, ER 75%, PR 75%, Ki 67: 20%, HER 2 1+ Neg CT scans and bone scan: 06/18/23:Left breast mass 2.2 cm, prom axillary LN, no distant mets, non specific pancreatic head/ uncinate mass 07/02/2023: Oncotype DX: 30 (distant recurrence  at 9 years: 19%) Neoadjuvant chemotherapy with dose dense Adriamycin  and Cytoxan  x 4 followed by Taxol  x 12 started 07/21/2023 Mastectomy versus lumpectomy (patient wishes to do bilateral breast reductions if possible): Will be determined by Dr. Afton Horse Adjuvant radiation Adjuvant antiestrogen therapy ------------------------------------------------------------------------------------------------------------------------------------ Current treatment: Completed 4 cycles of neoadjuvant dose dense Adriamycin  Cytoxan , today is cycle 6 Taxol  07/09/2023: Echo: EF 60 to 65% Chemo toxicities: Constipation: Managed with stool softeners Chemo-induced anemia: Stable Persistent cough: Protonix  and albuterol , possibly allergy related.  She will start taking allergy medication. Peripheral neuropathy: Tips of all of her fingers: I will reduce the dosage of Taxol  to 65 mg/m.   Return to clinic in 1 week for cycle 7   No orders of the defined types were placed in this encounter.  The patient has a good understanding of the overall plan. she agrees with it. she  will call with any problems that may develop before the next visit here. Total time spent: 30 mins including face to face time and time spent for planning, charting and co-ordination of care   Margert Sheerer, MD 10/20/23

## 2023-10-20 NOTE — Assessment & Plan Note (Signed)
 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 06/18/2023: Left breast biopsy 2:30 position: Grade 2 IDC with DCIS ER 75%, PR 75%, Ki67 20%, HER2 1+ negative   Treatment plan: Biopsy on the 5 cm of enhancement: Grade 2 IDC, ER 75%, PR 75%, Ki 67: 20%, HER 2 1+ Neg CT scans and bone scan: 06/18/23:Left breast mass 2.2 cm, prom axillary LN, no distant mets, non specific pancreatic head/ uncinate mass 07/02/2023: Oncotype DX: 30 (distant recurrence at 9 years: 19%) Neoadjuvant chemotherapy with dose dense Adriamycin  and Cytoxan  x 4 followed by Taxol  x 12 started 07/21/2023 Mastectomy versus lumpectomy (patient wishes to do bilateral breast reductions if possible): Will be determined by Dr. Afton Horse Adjuvant radiation Adjuvant antiestrogen therapy ------------------------------------------------------------------------------------------------------------------------------------ Current treatment: Completed 4 cycles of neoadjuvant dose dense Adriamycin  Cytoxan , today is cycle 6 Taxol  07/09/2023: Echo: EF 60 to 65% Chemo toxicities: Constipation: Managed with stool softeners Chemo-induced anemia: Stable Persistent cough: Protonix  and albuterol  Mild peripheral neuropathy especially in the thumb: Monitoring closely Nasal dryness and epistaxis Sore on the bottom lip   Labs are stable and within parameters for her to proceed with treatment today with no dose modifications or adjustments.

## 2023-10-20 NOTE — Patient Instructions (Signed)
 [facility] - A DEPT OF [facility]  Discharge Instructions: Thank you for choosing [facility] to provide your oncology and hematology care.   If you have a lab appointment with the Cancer Center, please go directly to the Cancer Center and check in at the registration area.   Wear comfortable clothing and clothing appropriate for easy access to any Portacath or PICC line.   We strive to give you quality time with your provider. You may need to reschedule your appointment if you arrive late (15 or more minutes).  Arriving late affects you and other patients whose appointments are after yours.  Also, if you miss three or more appointments without notifying the office, you may be dismissed from the clinic at the provider's discretion.      For prescription refill requests, have your pharmacy contact our office and allow 72 hours for refills to be completed.    Today you received the following chemotherapy and/or immunotherapy agent: Paclitaxel    To help prevent nausea and vomiting after your treatment, we encourage you to take your nausea medication as directed.  BELOW ARE SYMPTOMS THAT SHOULD BE REPORTED IMMEDIATELY: *FEVER GREATER THAN 100.4 F (38 C) OR HIGHER *CHILLS OR SWEATING *NAUSEA AND VOMITING THAT IS NOT CONTROLLED WITH YOUR NAUSEA MEDICATION *UNUSUAL SHORTNESS OF BREATH *UNUSUAL BRUISING OR BLEEDING *URINARY PROBLEMS (pain or burning when urinating, or frequent urination) *BOWEL PROBLEMS (unusual diarrhea, constipation, pain near the anus) TENDERNESS IN MOUTH AND THROAT WITH OR WITHOUT PRESENCE OF ULCERS (sore throat, sores in mouth, or a toothache) UNUSUAL RASH, SWELLING OR PAIN  UNUSUAL VAGINAL DISCHARGE OR ITCHING   Items with * indicate a potential emergency and should be followed up as soon as possible or go to the Emergency Department if any problems should occur.  Please show the CHEMOTHERAPY ALERT CARD or IMMUNOTHERAPY  ALERT CARD at check-in to the Emergency Department and triage nurse.  Should you have questions after your visit or need to cancel or reschedule your appointment, please contact [facility] - A DEPT OF [facility]  Dept: [phone]  and follow the prompts.  Office hours are 8:00 a.m. to 4:30 p.m. Monday - Friday. Please note that voicemails left after 4:00 p.m. may not be returned until the following business day.  We are closed weekends and major holidays. You have access to a nurse at all times for urgent questions. Please call the main number to the clinic Dept: [phone] and follow the prompts.   For any non-urgent questions, you may also contact your provider using MyChart. We now offer e-Visits for anyone 80 and older to request care online for non-urgent symptoms. For details visit [email or web address].   Also download the MyChart app! Go to the app store, search "MyChart", open the app, select [facility], and log in with your MyChart username and password.

## 2023-10-23 ENCOUNTER — Other Ambulatory Visit: Payer: Self-pay | Admitting: *Deleted

## 2023-10-23 DIAGNOSIS — C50812 Malignant neoplasm of overlapping sites of left female breast: Secondary | ICD-10-CM

## 2023-10-27 ENCOUNTER — Inpatient Hospital Stay (HOSPITAL_BASED_OUTPATIENT_CLINIC_OR_DEPARTMENT_OTHER): Admitting: Hematology and Oncology

## 2023-10-27 ENCOUNTER — Inpatient Hospital Stay

## 2023-10-27 VITALS — BP 135/77 | HR 111 | Temp 98.7°F | Resp 19 | Ht 64.0 in | Wt 180.4 lb

## 2023-10-27 VITALS — HR 97

## 2023-10-27 DIAGNOSIS — Z95828 Presence of other vascular implants and grafts: Secondary | ICD-10-CM

## 2023-10-27 DIAGNOSIS — Z170 Estrogen receptor positive status [ER+]: Secondary | ICD-10-CM | POA: Diagnosis not present

## 2023-10-27 DIAGNOSIS — C50812 Malignant neoplasm of overlapping sites of left female breast: Secondary | ICD-10-CM

## 2023-10-27 LAB — CMP (CANCER CENTER ONLY)
ALT: 19 U/L (ref 0–44)
AST: 20 U/L (ref 15–41)
Albumin: 3.8 g/dL (ref 3.5–5.0)
Alkaline Phosphatase: 69 U/L (ref 38–126)
Anion gap: 6 (ref 5–15)
BUN: 10 mg/dL (ref 6–20)
CO2: 26 mmol/L (ref 22–32)
Calcium: 9.2 mg/dL (ref 8.9–10.3)
Chloride: 106 mmol/L (ref 98–111)
Creatinine: 0.72 mg/dL (ref 0.44–1.00)
GFR, Estimated: >60 mL/min (ref >60)
Glucose, Bld: 104 mg/dL — ABNORMAL HIGH (ref 70–99)
Potassium: 3.9 mmol/L (ref 3.5–5.1)
Sodium: 138 mmol/L (ref 135–145)
Total Bilirubin: 0.4 mg/dL (ref 0.0–1.2)
Total Protein: 6.2 g/dL — ABNORMAL LOW (ref 6.5–8.1)

## 2023-10-27 LAB — CBC WITH DIFFERENTIAL (CANCER CENTER ONLY)
Abs Immature Granulocytes: 0.04 10*3/uL (ref 0.00–0.07)
Basophils Absolute: 0 10*3/uL (ref 0.0–0.1)
Basophils Relative: 1 %
Eosinophils Absolute: 0.1 10*3/uL (ref 0.0–0.5)
Eosinophils Relative: 3 %
HCT: 29.3 % — ABNORMAL LOW (ref 36.0–46.0)
Hemoglobin: 10 g/dL — ABNORMAL LOW (ref 12.0–15.0)
Immature Granulocytes: 1 %
Lymphocytes Relative: 14 %
Lymphs Abs: 0.8 10*3/uL (ref 0.7–4.0)
MCH: 33.1 pg (ref 26.0–34.0)
MCHC: 34.1 g/dL (ref 30.0–36.0)
MCV: 97 fL (ref 80.0–100.0)
Monocytes Absolute: 0.8 10*3/uL (ref 0.1–1.0)
Monocytes Relative: 15 %
Neutro Abs: 3.5 10*3/uL (ref 1.7–7.7)
Neutrophils Relative %: 66 %
Platelet Count: 363 10*3/uL (ref 150–400)
RBC: 3.02 MIL/uL — ABNORMAL LOW (ref 3.87–5.11)
RDW: 14.3 % (ref 11.5–15.5)
WBC Count: 5.3 10*3/uL (ref 4.0–10.5)
nRBC: 0 % (ref 0.0–0.2)

## 2023-10-27 LAB — RESEARCH LABS

## 2023-10-27 MED ORDER — SODIUM CHLORIDE 0.9 % IV SOLN
65.0000 mg/m2 | Freq: Once | INTRAVENOUS | Status: AC
  Administered 2023-10-27: 126 mg via INTRAVENOUS
  Filled 2023-10-27: qty 21

## 2023-10-27 MED ORDER — DIPHENHYDRAMINE HCL 50 MG/ML IJ SOLN
25.0000 mg | Freq: Once | INTRAMUSCULAR | Status: AC
  Administered 2023-10-27: 25 mg via INTRAVENOUS

## 2023-10-27 MED ORDER — FAMOTIDINE IN NACL 20-0.9 MG/50ML-% IV SOLN
20.0000 mg | Freq: Once | INTRAVENOUS | Status: AC
  Administered 2023-10-27: 20 mg via INTRAVENOUS

## 2023-10-27 MED ORDER — SODIUM CHLORIDE 0.9 % IV SOLN: INTRAVENOUS | Status: DC

## 2023-10-27 MED ORDER — DEXAMETHASONE SODIUM PHOSPHATE 10 MG/ML IJ SOLN
4.0000 mg | Freq: Once | INTRAMUSCULAR | Status: AC
  Administered 2023-10-27: 4 mg via INTRAVENOUS

## 2023-10-27 MED ORDER — SODIUM CHLORIDE 0.9% FLUSH
10.0000 mL | Freq: Once | INTRAVENOUS | Status: AC
  Administered 2023-10-27: 10 mL

## 2023-10-27 NOTE — Patient Instructions (Signed)

## 2023-10-27 NOTE — Assessment & Plan Note (Signed)
 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 06/18/2023: Left breast biopsy 2:30 position: Grade 2 IDC with DCIS ER 75%, PR 75%, Ki67 20%, HER2 1+ negative   Treatment plan: Biopsy on the 5 cm of enhancement: Grade 2 IDC, ER 75%, PR 75%, Ki 67: 20%, HER 2 1+ Neg CT scans and bone scan: 06/18/23:Left breast mass 2.2 cm, prom axillary LN, no distant mets, non specific pancreatic head/ uncinate mass 07/02/2023: Oncotype DX: 30 (distant recurrence at 9 years: 19%) Neoadjuvant chemotherapy with dose dense Adriamycin  and Cytoxan  x 4 followed by Taxol  x 12 started 07/21/2023 Mastectomy versus lumpectomy (patient wishes to do bilateral breast reductions if possible): Will be determined by Dr. Afton Horse Adjuvant radiation Adjuvant antiestrogen therapy ------------------------------------------------------------------------------------------------------------------------------------ Current treatment: Completed 4 cycles of neoadjuvant dose dense Adriamycin  Cytoxan , today is cycle 7 Taxol  07/09/2023: Echo: EF 60 to 65% Chemo toxicities: Constipation: Managed with stool softeners Chemo-induced anemia: Stable Persistent cough: Protonix  and albuterol , possibly allergy related.  She will start taking allergy medication. Peripheral neuropathy: Tips of all of her fingers: I will reduce the dosage of Taxol  to 65 mg/m.   Return to clinic in 1 week for cycle 8

## 2023-10-27 NOTE — Progress Notes (Signed)
 Patient Care Team: Judithann Novas, MD as PCP - General Alane Hsu, RN as Oncology Nurse Navigator Auther Bo, RN as Oncology Nurse Navigator Cameron Cea, MD as Consulting Physician (Hematology and Oncology) Sim Dryer, MD as Consulting Physician (General Surgery) Johna Myers, MD as Consulting Physician (Radiation Oncology)  DIAGNOSIS:  Encounter Diagnosis  Name Primary?   Malignant neoplasm of overlapping sites of left breast in female, estrogen receptor positive (HCC) Yes    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)  05/23/2023 Initial Biopsy   Left nipple punch biopsy: Infiltrating carcinoma consistent with breast origin (CK7 GATA3 and ER positive) ER 30%, PR 20%, HER2 1+ (send T4, N0, M0, stage IIIb)    06/02/2023 Breast MRI   Breast MRI: 2.8 cm biopsy-proven malignancy left nipple retroareolar left breast, 5 cm enhancement UOQ left breast posterior superior to the biopsy-proven malignancy, highly suspicious left breast enhancement spanning 9.8 cm.    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 Asymmetric masslike breast tissue in the lateral left breast measures 2.2 x 1.2 cm, compatible with known primary breast malignancy. 2. Prominent left axillary lymph nodes measure up to 5 mm in short axis, nonspecific consider attention on dedicated ultrasound. 3. No  convincing evidence of metastatic disease in the chest,  abdomen or pelvis. 4. Ill-defined hypodensity in the pancreatic head/uncinate process, nonspecific but favored to reflect fatty infiltration consider attention on follow-up imaging versus further evaluation by pre and postcontrast enhanced MRC. 5. Nonobstructive 3  mm right interpolar renal stone.  BONE SCAN:  IMPRESSION: 1. No evidence for osseous metastatic disease 2. Degenerative uptake at the left hip, feet and ankles.    07/21/2023 -  Neo-Adjuvant Chemotherapy   Neoadjuvant chemotherapy with dose dense Adriamycin  and Cytoxan  x 4 followed by Taxol  x 12 started 07/21/2023      CHIEF COMPLIANT: Taxol  cycle 7  HISTORY OF PRESENT ILLNESS: Kelsey Carlson is a 58 year old with above-mentioned history of breast cancer currently on neoadjuvant chemotherapy and today cycle 7 of Taxol .  She has mild peripheral neuropathy of the tips of the fingers and toes the rest pain is constant and steady.  It has not gotten any worse.  She also has significant changes to the nails and is concerned about the nail health.  Denied any nausea or vomiting.  She is excited today because she became a grandmother for a beautiful baby earlier today.    ALLERGIES:  is allergic to chlorhexidine .  MEDICATIONS:  Current Outpatient Medications  Medication Sig Dispense Refill   albuterol  (VENTOLIN  HFA) 108 (90 Base) MCG/ACT inhaler Inhale 2 puffs into the lungs every 6 (six) hours as needed for wheezing or shortness of breath. (Patient not taking: Reported on 10/20/2023) 8 g 2   pantoprazole  (PROTONIX ) 40 MG tablet TAKE 1 TABLET BY MOUTH EVERY DAY 90 tablet 1   No current facility-administered medications for this visit.    PHYSICAL EXAMINATION: ECOG PERFORMANCE STATUS:  1 - Symptomatic but completely ambulatory  Vitals:   10/27/23 1015  BP: 135/77  Pulse: (!) 111  Resp: 19  Temp: 98.7 F (37.1 C)  SpO2: 98%   Filed Weights   10/27/23 1015  Weight: 180 lb 6.4 oz (81.8 kg)      LABORATORY DATA:  I have reviewed the data as  listed    Latest Ref Rng & Units 10/20/2023    8:40 AM 10/14/2023   10:12 AM 10/06/2023   10:46 AM  CMP  Glucose 70 - 99 mg/dL 161  94  93   BUN 6 - 20 mg/dL 6  8  7    Creatinine 0.44 - 1.00 mg/dL 0.96  0.45  4.09   Sodium 135 - 145 mmol/L 138  139  140   Potassium 3.5 - 5.1 mmol/L 3.7  3.9  3.9   Chloride 98 - 111 mmol/L 107  108  109   CO2 22 - 32 mmol/L 26  25  27    Calcium 8.9 - 10.3 mg/dL 8.9  9.0  9.2   Total Protein 6.5 - 8.1 g/dL 6.0  6.3  6.3   Total Bilirubin 0.0 - 1.2 mg/dL 0.5  0.4  0.4   Alkaline Phos 38 - 126 U/L 73  69  57   AST 15 - 41 U/L 17  20  20    ALT 0 - 44 U/L 20  26  30      Lab Results  Component Value Date   WBC 5.3 10/27/2023   HGB 10.0 (L) 10/27/2023   HCT 29.3 (L) 10/27/2023   MCV 97.0 10/27/2023   PLT 363 10/27/2023   NEUTROABS 3.5 10/27/2023    ASSESSMENT & PLAN:  Malignant neoplasm of overlapping sites of left female breast (HCC) 05/23/2023: Left nipple punch biopsy: Infiltrating carcinoma consistent with breast origin (CK7 GATA3 and ER positive) ER 30%, PR 20%, HER2 1+ (send T4, N0, M0, stage IIIb) 06/02/2023: Breast MRI: 2.8 cm biopsy-proven malignancy left nipple retroareolar left breast, 5 cm enhancement UOQ left breast posterior superior to the biopsy-proven malignancy, highly suspicious left breast enhancement spanning 9.8 cm. 06/18/2023: Left breast biopsy 2:30 position: Grade 2 IDC with DCIS ER 75%, PR 75%, Ki67 20%, HER2 1+ negative   Treatment plan: Biopsy on the 5 cm of enhancement: Grade 2 IDC, ER 75%, PR 75%, Ki 67: 20%, HER 2 1+ Neg CT scans and bone scan: 06/18/23:Left breast mass 2.2 cm, prom axillary LN, no distant mets, non specific pancreatic head/ uncinate mass 07/02/2023: Oncotype DX: 30 (distant recurrence at 9 years: 19%) Neoadjuvant chemotherapy with dose dense Adriamycin  and Cytoxan  x 4 followed by Taxol  x 12 started 07/21/2023 Mastectomy versus lumpectomy (patient wishes to do bilateral breast reductions if possible): Will be  determined by Dr. Afton Horse Adjuvant radiation Adjuvant antiestrogen therapy ------------------------------------------------------------------------------------------------------------------------------------ Current treatment: Completed 4 cycles of neoadjuvant dose dense Adriamycin  Cytoxan , today is cycle 7 Taxol  07/09/2023: Echo: EF 60 to 65% Chemo toxicities: Constipation: Managed with stool softeners Chemo-induced anemia: Stable Persistent cough: Protonix  and albuterol   Peripheral neuropathy: Tips of all of her fingers: I will reduce the dosage of Taxol  to 65 mg/m.  Neuropathy grade 1 Nailbed discoloration and nail changes   Grandson was born today 10/27/2023 Return to clinic in 1 week for cycle 8      No orders of the defined types were placed in this encounter.  The patient has a good understanding of the overall plan. she agrees with it. she will call  with any problems that may develop before the next visit here. Total time spent: 30 mins including face to face time and time spent for planning, charting and co-ordination of care   Margert Sheerer, MD 10/27/23

## 2023-10-27 NOTE — Research (Signed)
 [id number], ICE COMPRESS: RANDOMIZED TRIAL OF LIMB CRYOCOMPRESSION VERSUS CONTINUOUS COMPRESSION VERSUS LOW CYCLIC COMPRESSION FOR THE PREVENTION  OF TAXANE-INDUCED PERIPHERAL NEUROPATHY    Patient arrives today for the Cycle 7 Week 6 visit. Confirmed patient does not have wounds, sores, or lesions to extremities. Patient has not had any vaccinations since last visit.    Week 6 Assessments:    PROs: Provided to patient at registration prior to her other appointments.  Collected and checked for completeness and accruracy.    Labs: Optional labs were drawn today.   Neuropathy Assessments: Neuropen, T[place] and Timed Get Up and Go tests performed by Research Nurse [name], RN with assistance recording by Research Coordinator Pinkey Brier, MPH.    Supplemental Agents, Prescription Medications and Complementary Medicine:  Medication list reviewed with patient. She has not taken duloxetine or any vitamins or supplements for symptom management of peripheral neuropathy since starting on study.  Patient has not received any complementary or alternative therapies for symptom management of peripheral neuropathy since starting on study.      ADVERSE EVENTS: Solicited AEs reviewed with patient. See AE table below.   Patient reports Sensory Neuropathy symptoms (intermittent numbness and tingling in tips of all fingers and toes). She denies any symptoms of motor neuropathy. Dr. 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    Other AEs are also noted below and none of them are related to study intervention per Dr. Lee Public.   She reports tightness in her nails she describes feeling as "nail salon acrylic nail application." on her natural nails. Patient reports nail color changes of discoloration as well as sides of cuticles have "fallen off". Patient describes some difficulty with buttoning shirt. Patient reports redness of skin on both her hands. See AE table below.    BASELINE AEs Adverse Event CTCAE Grade Onset date Resolved date Relationship to Study Intervention Relationship to Taxane Action Taken Comments  Skin atrophy (solicited) 0              Skin hyperpigmentation (solicited) 0              Skin hypopigmentation (solicited) 0              Skin induration (solicited) 0              Skin ulceration (solicited) 0              Rash maculopapular (solicited) 0              Nail changes (solicited) Present    10/27/23    Not related Definitely related to Taxane Per Dr. Gudena.      Cold intolerance (solicited) (general disorders and administration site conditions- other) 0              Frostbite (solicited) (skin and subcutaneous tissue disorders- other) 0              Nail Discoloration 1  10/27/2023  Not Related. Definitely related to Taxane Per Dr. Lee Public.   Brown spots and lines on nail bed.  Sensory Peripheral Neuropathy 1 10/14/2023 per Dr. Alix Aquas Note.  Not Related. Definitely related to Taxane Per Dr. Lee Public.  See note above for description.     STUDY INTERVENTION & TOLERABILITY ASSESSMENTS: Off Device.   PLAN:  The patient was thanked for their time . She understands research will meet her again at next scheduled assessment appointment this will be for the 12  wk assessment. Patient Kelsey Carlson. [name] has been provided direct contact information and is encouraged to contact this Coordinator for any needs or questions.    Pinkey Brier, MPH  Clinical Research Coordinator

## 2023-10-28 ENCOUNTER — Encounter: Payer: Self-pay | Admitting: Hematology and Oncology

## 2023-10-31 ENCOUNTER — Encounter: Payer: Self-pay | Admitting: Hematology and Oncology

## 2023-11-03 ENCOUNTER — Encounter: Payer: Self-pay | Admitting: *Deleted

## 2023-11-03 ENCOUNTER — Inpatient Hospital Stay (HOSPITAL_BASED_OUTPATIENT_CLINIC_OR_DEPARTMENT_OTHER): Admitting: Hematology and Oncology

## 2023-11-03 ENCOUNTER — Inpatient Hospital Stay

## 2023-11-03 VITALS — BP 121/58 | HR 93 | Temp 97.6°F | Resp 18 | Ht 64.0 in | Wt 181.1 lb

## 2023-11-03 DIAGNOSIS — C50812 Malignant neoplasm of overlapping sites of left female breast: Secondary | ICD-10-CM

## 2023-11-03 DIAGNOSIS — Z170 Estrogen receptor positive status [ER+]: Secondary | ICD-10-CM

## 2023-11-03 DIAGNOSIS — Z95828 Presence of other vascular implants and grafts: Secondary | ICD-10-CM

## 2023-11-03 LAB — CMP (CANCER CENTER ONLY)
ALT: 20 U/L (ref 0–44)
AST: 19 U/L (ref 15–41)
Albumin: 3.9 g/dL (ref 3.5–5.0)
Alkaline Phosphatase: 64 U/L (ref 38–126)
Anion gap: 6 (ref 5–15)
BUN: 8 mg/dL (ref 6–20)
CO2: 27 mmol/L (ref 22–32)
Calcium: 9.2 mg/dL (ref 8.9–10.3)
Chloride: 107 mmol/L (ref 98–111)
Creatinine: 0.63 mg/dL (ref 0.44–1.00)
GFR, Estimated: >60 mL/min (ref >60)
Glucose, Bld: 84 mg/dL (ref 70–99)
Potassium: 3.9 mmol/L (ref 3.5–5.1)
Sodium: 140 mmol/L (ref 135–145)
Total Bilirubin: 0.4 mg/dL (ref 0.0–1.2)
Total Protein: 6.3 g/dL — ABNORMAL LOW (ref 6.5–8.1)

## 2023-11-03 LAB — CBC WITH DIFFERENTIAL (CANCER CENTER ONLY)
Abs Immature Granulocytes: 0.09 10*3/uL — ABNORMAL HIGH (ref 0.00–0.07)
Basophils Absolute: 0.1 10*3/uL (ref 0.0–0.1)
Basophils Relative: 1 %
Eosinophils Absolute: 0.2 10*3/uL (ref 0.0–0.5)
Eosinophils Relative: 3 %
HCT: 30.9 % — ABNORMAL LOW (ref 36.0–46.0)
Hemoglobin: 10.4 g/dL — ABNORMAL LOW (ref 12.0–15.0)
Immature Granulocytes: 2 %
Lymphocytes Relative: 15 %
Lymphs Abs: 0.8 10*3/uL (ref 0.7–4.0)
MCH: 32.9 pg (ref 26.0–34.0)
MCHC: 33.7 g/dL (ref 30.0–36.0)
MCV: 97.8 fL (ref 80.0–100.0)
Monocytes Absolute: 1 10*3/uL (ref 0.1–1.0)
Monocytes Relative: 18 %
Neutro Abs: 3.4 10*3/uL (ref 1.7–7.7)
Neutrophils Relative %: 61 %
Platelet Count: 385 10*3/uL (ref 150–400)
RBC: 3.16 MIL/uL — ABNORMAL LOW (ref 3.87–5.11)
RDW: 13.7 % (ref 11.5–15.5)
WBC Count: 5.5 10*3/uL (ref 4.0–10.5)
nRBC: 0 % (ref 0.0–0.2)

## 2023-11-03 MED ORDER — FAMOTIDINE IN NACL 20-0.9 MG/50ML-% IV SOLN
20.0000 mg | Freq: Once | INTRAVENOUS | Status: AC
  Administered 2023-11-03: 20 mg via INTRAVENOUS
  Filled 2023-11-03: qty 50

## 2023-11-03 MED ORDER — SODIUM CHLORIDE 0.9% FLUSH
10.0000 mL | Freq: Once | INTRAVENOUS | Status: AC
  Administered 2023-11-03: 10 mL

## 2023-11-03 MED ORDER — DIPHENHYDRAMINE HCL 50 MG/ML IJ SOLN
25.0000 mg | Freq: Once | INTRAMUSCULAR | Status: AC
  Administered 2023-11-03: 25 mg via INTRAVENOUS
  Filled 2023-11-03: qty 1

## 2023-11-03 MED ORDER — SODIUM CHLORIDE 0.9 % IV SOLN
65.0000 mg/m2 | Freq: Once | INTRAVENOUS | Status: AC
  Administered 2023-11-03: 126 mg via INTRAVENOUS
  Filled 2023-11-03: qty 21

## 2023-11-03 MED ORDER — SODIUM CHLORIDE 0.9% FLUSH
10.0000 mL | INTRAVENOUS | Status: DC | PRN
  Administered 2023-11-03: 10 mL

## 2023-11-03 MED ORDER — SODIUM CHLORIDE 0.9 % IV SOLN: INTRAVENOUS | Status: DC

## 2023-11-03 MED ORDER — DEXAMETHASONE SODIUM PHOSPHATE 10 MG/ML IJ SOLN
4.0000 mg | Freq: Once | INTRAMUSCULAR | Status: AC
  Administered 2023-11-03: 4 mg via INTRAVENOUS
  Filled 2023-11-03: qty 1

## 2023-11-03 MED ORDER — HEPARIN SOD (PORK) LOCK FLUSH 100 UNIT/ML IV SOLN
500.0000 [IU] | Freq: Once | INTRAVENOUS | Status: AC | PRN
  Administered 2023-11-03: 500 [IU]

## 2023-11-03 NOTE — Progress Notes (Signed)
 Patient Care Team: Judithann Novas, MD as PCP - General Alane Hsu, RN as Oncology Nurse Navigator Auther Bo, RN as Oncology Nurse Navigator Cameron Cea, MD as Consulting Physician (Hematology and Oncology) Sim Dryer, MD as Consulting Physician (General Surgery) Johna Myers, MD as Consulting Physician (Radiation Oncology)  DIAGNOSIS:  Encounter Diagnosis  Name Primary?   Malignant neoplasm of overlapping sites of left breast in female, estrogen receptor positive (HCC) Yes    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)  05/23/2023 Initial Biopsy   Left nipple punch biopsy: Infiltrating carcinoma consistent with breast origin (CK7 GATA3 and ER positive) ER 30%, PR 20%, HER2 1+ (send T4, N0, M0, stage IIIb)    06/02/2023 Breast MRI   Breast MRI: 2.8 cm biopsy-proven malignancy left nipple retroareolar left breast, 5 cm enhancement UOQ left breast posterior superior to the biopsy-proven malignancy, highly suspicious left breast enhancement spanning 9.8 cm.    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 Asymmetric masslike breast tissue in the lateral left breast measures 2.2 x 1.2 cm, compatible with known primary breast malignancy. 2. Prominent left axillary lymph nodes measure up to 5 mm in short axis, nonspecific consider attention on dedicated ultrasound. 3. No  convincing evidence of metastatic disease in the chest,  abdomen or pelvis. 4. Ill-defined hypodensity in the pancreatic head/uncinate process, nonspecific but favored to reflect fatty infiltration consider attention on follow-up imaging versus further evaluation by pre and postcontrast enhanced MRC. 5. Nonobstructive 3  mm right interpolar renal stone.  BONE SCAN:  IMPRESSION: 1. No evidence for osseous metastatic disease 2. Degenerative uptake at the left hip, feet and ankles.    07/21/2023 -  Neo-Adjuvant Chemotherapy   Neoadjuvant chemotherapy with dose dense Adriamycin  and Cytoxan  x 4 followed by Taxol  x 12 started 07/21/2023      CHIEF COMPLIANT: Cycle 8 Taxol   HISTORY OF PRESENT ILLNESS:   History of Present Illness Kelsey Carlson is a 58 year old female undergoing chemotherapy who presents for follow-up regarding treatment side effects.  She experiences persistent numbness in her fingers and toes, affecting her ability to perform tasks such as opening ziplock bags. This numbness has been present since the last visit and remains unchanged.  Her energy levels are generally good, with a transient episode of significant fatigue last Thursday. She denies persistent fatigue.  She has a decreased appetite, eating less than usual, but occasionally craves steak, baked potato, and broccoli.  Her hemoglobin is 10.4, up from 10 at the last visit. Her white blood cell count is 5.5 and her platelets are normal.     ALLERGIES:  is allergic to chlorhexidine .  MEDICATIONS:  Current Outpatient Medications  Medication Sig Dispense Refill   albuterol  (VENTOLIN  HFA) 108 (90 Base) MCG/ACT inhaler Inhale 2 puffs into the lungs every 6 (six) hours as needed for wheezing or shortness of breath. (Patient not taking: Reported on 10/20/2023) 8 g  2   pantoprazole  (PROTONIX ) 40 MG tablet TAKE 1 TABLET BY MOUTH EVERY DAY 90 tablet 1   No current facility-administered medications for this visit.    PHYSICAL EXAMINATION: ECOG PERFORMANCE STATUS: 1 -  Symptomatic but completely ambulatory  Vitals:   11/03/23 1048  BP: (!) 121/58  Pulse: 93  Resp: 18  Temp: 97.6 F (36.4 C)  SpO2: 100%   Filed Weights   11/03/23 1048  Weight: 181 lb 1.6 oz (82.1 kg)      LABORATORY DATA:  I have reviewed the data as listed    Latest Ref Rng & Units 10/27/2023    9:45 AM 10/20/2023    8:40 AM 10/14/2023   10:12 AM  CMP  Glucose 70 - 99 mg/dL 696  295  94   BUN 6 - 20 mg/dL 10  6  8    Creatinine 0.44 - 1.00 mg/dL 2.84  1.32  4.40   Sodium 135 - 145 mmol/L 138  138  139   Potassium 3.5 - 5.1 mmol/L 3.9  3.7  3.9   Chloride 98 - 111 mmol/L 106  107  108   CO2 22 - 32 mmol/L 26  26  25    Calcium 8.9 - 10.3 mg/dL 9.2  8.9  9.0   Total Protein 6.5 - 8.1 g/dL 6.2  6.0  6.3   Total Bilirubin 0.0 - 1.2 mg/dL 0.4  0.5  0.4   Alkaline Phos 38 - 126 U/L 69  73  69   AST 15 - 41 U/L 20  17  20    ALT 0 - 44 U/L 19  20  26      Lab Results  Component Value Date   WBC 5.5 11/03/2023   HGB 10.4 (L) 11/03/2023   HCT 30.9 (L) 11/03/2023   MCV 97.8 11/03/2023   PLT 385 11/03/2023   NEUTROABS 3.4 11/03/2023    ASSESSMENT & PLAN:  Malignant neoplasm of overlapping sites of left female breast (HCC) 05/23/2023: Left nipple punch biopsy: Infiltrating carcinoma consistent with breast origin (CK7 GATA3 and ER positive) ER 30%, PR 20%, HER2 1+ (send T4, N0, M0, stage IIIb) 06/02/2023: Breast MRI: 2.8 cm biopsy-proven malignancy left nipple retroareolar left breast, 5 cm enhancement UOQ left breast posterior superior to the biopsy-proven malignancy, highly suspicious left breast enhancement spanning 9.8 cm. 06/18/2023: Left breast biopsy 2:30 position: Grade 2 IDC with DCIS ER 75%, PR 75%, Ki67 20%, HER2 1+ negative   Treatment plan: Biopsy on the 5 cm of enhancement: Grade 2 IDC, ER 75%, PR 75%, Ki 67: 20%, HER 2 1+ Neg CT scans and bone scan: 06/18/23:Left breast mass 2.2 cm, prom axillary LN, no distant mets, non specific pancreatic head/ uncinate  mass 07/02/2023: Oncotype DX: 30 (distant recurrence at 9 years: 19%) Neoadjuvant chemotherapy with dose dense Adriamycin  and Cytoxan  x 4 followed by Taxol  x 12 started 07/21/2023 Mastectomy versus lumpectomy (patient wishes to do bilateral breast reductions if possible): Will be determined by Dr. Afton Horse Adjuvant radiation Adjuvant antiestrogen therapy ------------------------------------------------------------------------------------------------------------------------------------ Current treatment: Completed 4 cycles of neoadjuvant dose dense Adriamycin  Cytoxan , today is cycle 8 Taxol  07/09/2023: Echo: EF 60 to 65% Chemo toxicities: Constipation: Managed with stool softeners Chemo-induced anemia: Stable Persistent cough: Protonix  and albuterol   Peripheral neuropathy: Tips of all of her fingers: we reduced the dosage of Taxol  to 65 mg/m.  Neuropathy grade 1 Nailbed discoloration and nail changes   Grandson was born today 10/27/2023 Return to clinic in 1 week for cycle  9 ------------------------------------- Assessment and Plan Assessment & Plan Malignant neoplasm of overlapping sites of left breast Undergoing treatment for estrogen receptor-positive neoplasm. Experiencing peripheral neuropathy affecting dexterity. Hemoglobin improved to 10.4. Treatment plan proceeding as expected with four weeks remaining. Considering breast reduction surgery post-treatment. - Continue current treatment regimen for four more weeks. - Schedule MRI for the day after the last treatment session. - Coordinate with Dr. Margaree Shark for surgical consultation post-MRI. - Refer to Dr. Dollene Fresh for plastic surgery consultation regarding breast reduction.      No orders of the defined types were placed in this encounter.  The patient has a good understanding of the overall plan. she agrees with it. she will call with any problems that may develop before the next visit here. Total time spent: 30 mins including face  to face time and time spent for planning, charting and co-ordination of care   Margert Sheerer, MD 11/03/23

## 2023-11-03 NOTE — Assessment & Plan Note (Signed)
 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 06/18/2023: Left breast biopsy 2:30 position: Grade 2 IDC with DCIS ER 75%, PR 75%, Ki67 20%, HER2 1+ negative   Treatment plan: Biopsy on the 5 cm of enhancement: Grade 2 IDC, ER 75%, PR 75%, Ki 67: 20%, HER 2 1+ Neg CT scans and bone scan: 06/18/23:Left breast mass 2.2 cm, prom axillary LN, no distant mets, non specific pancreatic head/ uncinate mass 07/02/2023: Oncotype DX: 30 (distant recurrence at 9 years: 19%) Neoadjuvant chemotherapy with dose dense Adriamycin  and Cytoxan  x 4 followed by Taxol  x 12 started 07/21/2023 Mastectomy versus lumpectomy (patient wishes to do bilateral breast reductions if possible): Will be determined by Dr. Afton Horse Adjuvant radiation Adjuvant antiestrogen therapy ------------------------------------------------------------------------------------------------------------------------------------ Current treatment: Completed 4 cycles of neoadjuvant dose dense Adriamycin  Cytoxan , today is cycle 8 Taxol  07/09/2023: Echo: EF 60 to 65% Chemo toxicities: Constipation: Managed with stool softeners Chemo-induced anemia: Stable Persistent cough: Protonix  and albuterol   Peripheral neuropathy: Tips of all of her fingers: I will reduce the dosage of Taxol  to 65 mg/m.  Neuropathy grade 1 Nailbed discoloration and nail changes   Grandson was born today 10/27/2023 Return to clinic in 1 week for cycle 9

## 2023-11-10 NOTE — Assessment & Plan Note (Signed)
(  HCC) 05/23/2023: Left nipple punch biopsy: Infiltrating carcinoma consistent with breast origin (CK7 GATA3 and ER positive) ER 30%, PR 20%, HER2 1+ (send T4, N0, M0, stage IIIb) 06/02/2023: Breast MRI: 2.8 cm biopsy-proven malignancy left nipple retroareolar left breast, 5 cm enhancement UOQ left breast posterior superior to the biopsy-proven malignancy, highly suspicious left breast enhancement spanning 9.8 cm. 06/18/2023: Left breast biopsy 2:30 position: Grade 2 IDC with DCIS ER 75%, PR 75%, Ki67 20%, HER2 1+ negative   Treatment plan: Biopsy on the 5 cm of enhancement: Grade 2 IDC, ER 75%, PR 75%, Ki 67: 20%, HER 2 1+ Neg CT scans and bone scan: 06/18/23:Left breast mass 2.2 cm, prom axillary LN, no distant mets, non specific pancreatic head/ uncinate mass 07/02/2023: Oncotype DX: 30 (distant recurrence at 9 years: 19%) Neoadjuvant chemotherapy with dose dense Adriamycin  and Cytoxan  x 4 followed by Taxol  x 12 started 07/21/2023 Mastectomy versus lumpectomy (patient wishes to do bilateral breast reductions if possible): Will be determined by Dr. Vanderbilt Adjuvant radiation Adjuvant antiestrogen therapy ------------------------------------------------------------------------------------------------------------------------------------ Current treatment: Completed 4 cycles of neoadjuvant dose dense Adriamycin  Cytoxan , today is cycle 9 Taxol  07/09/2023: Echo: EF 60 to 65% Chemo toxicities: Constipation: Managed with stool softeners Chemo-induced anemia: Stable Persistent cough: Protonix  and albuterol   Peripheral neuropathy: Tips of all of her fingers: we reduced the dosage of Taxol  to 65 mg/m.  Neuropathy grade 1 Nailbed discoloration and nail changes   Grandson was born today 10/27/2023 Return to clinic in 1 week for cycle 10

## 2023-11-11 ENCOUNTER — Inpatient Hospital Stay (HOSPITAL_BASED_OUTPATIENT_CLINIC_OR_DEPARTMENT_OTHER): Admitting: Hematology and Oncology

## 2023-11-11 ENCOUNTER — Inpatient Hospital Stay

## 2023-11-11 VITALS — BP 123/50 | HR 100 | Temp 97.7°F | Resp 16 | Wt 178.2 lb

## 2023-11-11 DIAGNOSIS — Z170 Estrogen receptor positive status [ER+]: Secondary | ICD-10-CM | POA: Diagnosis not present

## 2023-11-11 DIAGNOSIS — C50812 Malignant neoplasm of overlapping sites of left female breast: Secondary | ICD-10-CM

## 2023-11-11 DIAGNOSIS — Z95828 Presence of other vascular implants and grafts: Secondary | ICD-10-CM

## 2023-11-11 LAB — CBC WITH DIFFERENTIAL (CANCER CENTER ONLY)
Abs Immature Granulocytes: 0.06 10*3/uL (ref 0.00–0.07)
Basophils Absolute: 0.1 10*3/uL (ref 0.0–0.1)
Basophils Relative: 1 %
Eosinophils Absolute: 0.2 10*3/uL (ref 0.0–0.5)
Eosinophils Relative: 3 %
HCT: 30.8 % — ABNORMAL LOW (ref 36.0–46.0)
Hemoglobin: 10.4 g/dL — ABNORMAL LOW (ref 12.0–15.0)
Immature Granulocytes: 1 %
Lymphocytes Relative: 15 %
Lymphs Abs: 0.8 10*3/uL (ref 0.7–4.0)
MCH: 32.9 pg (ref 26.0–34.0)
MCHC: 33.8 g/dL (ref 30.0–36.0)
MCV: 97.5 fL (ref 80.0–100.0)
Monocytes Absolute: 0.8 10*3/uL (ref 0.1–1.0)
Monocytes Relative: 16 %
Neutro Abs: 3.3 10*3/uL (ref 1.7–7.7)
Neutrophils Relative %: 64 %
Platelet Count: 324 10*3/uL (ref 150–400)
RBC: 3.16 MIL/uL — ABNORMAL LOW (ref 3.87–5.11)
RDW: 13.3 % (ref 11.5–15.5)
WBC Count: 5.1 10*3/uL (ref 4.0–10.5)
nRBC: 0 % (ref 0.0–0.2)

## 2023-11-11 LAB — CMP (CANCER CENTER ONLY)
ALT: 20 U/L (ref 0–44)
AST: 19 U/L (ref 15–41)
Albumin: 3.7 g/dL (ref 3.5–5.0)
Alkaline Phosphatase: 63 U/L (ref 38–126)
Anion gap: 6 (ref 5–15)
BUN: 8 mg/dL (ref 6–20)
CO2: 26 mmol/L (ref 22–32)
Calcium: 9.1 mg/dL (ref 8.9–10.3)
Chloride: 108 mmol/L (ref 98–111)
Creatinine: 0.7 mg/dL (ref 0.44–1.00)
GFR, Estimated: >60 mL/min (ref >60)
Glucose, Bld: 127 mg/dL — ABNORMAL HIGH (ref 70–99)
Potassium: 3.6 mmol/L (ref 3.5–5.1)
Sodium: 140 mmol/L (ref 135–145)
Total Bilirubin: 0.4 mg/dL (ref 0.0–1.2)
Total Protein: 6 g/dL — ABNORMAL LOW (ref 6.5–8.1)

## 2023-11-11 MED ORDER — HEPARIN SOD (PORK) LOCK FLUSH 100 UNIT/ML IV SOLN
500.0000 [IU] | Freq: Once | INTRAVENOUS | Status: AC | PRN
  Administered 2023-11-11: 500 [IU]

## 2023-11-11 MED ORDER — FAMOTIDINE IN NACL 20-0.9 MG/50ML-% IV SOLN
20.0000 mg | Freq: Once | INTRAVENOUS | Status: AC
  Administered 2023-11-11: 20 mg via INTRAVENOUS
  Filled 2023-11-11: qty 50

## 2023-11-11 MED ORDER — SODIUM CHLORIDE 0.9% FLUSH
10.0000 mL | INTRAVENOUS | Status: DC | PRN
  Administered 2023-11-11: 10 mL

## 2023-11-11 MED ORDER — DIPHENHYDRAMINE HCL 50 MG/ML IJ SOLN
25.0000 mg | Freq: Once | INTRAMUSCULAR | Status: AC
  Administered 2023-11-11: 25 mg via INTRAVENOUS
  Filled 2023-11-11: qty 1

## 2023-11-11 MED ORDER — SODIUM CHLORIDE 0.9 % IV SOLN
65.0000 mg/m2 | Freq: Once | INTRAVENOUS | Status: AC
  Administered 2023-11-11: 126 mg via INTRAVENOUS
  Filled 2023-11-11: qty 21

## 2023-11-11 MED ORDER — SODIUM CHLORIDE 0.9 % IV SOLN: INTRAVENOUS | Status: DC

## 2023-11-11 MED ORDER — DEXAMETHASONE SODIUM PHOSPHATE 10 MG/ML IJ SOLN
4.0000 mg | Freq: Once | INTRAMUSCULAR | Status: AC
  Administered 2023-11-11: 4 mg via INTRAVENOUS
  Filled 2023-11-11: qty 1

## 2023-11-11 MED ORDER — SODIUM CHLORIDE 0.9% FLUSH
10.0000 mL | Freq: Once | INTRAVENOUS | Status: AC
  Administered 2023-11-11: 10 mL

## 2023-11-11 NOTE — Progress Notes (Signed)
 Patient Care Team: Avelina Greig BRAVO, MD as PCP - General Tyree Nanetta SAILOR, RN as Oncology Nurse Navigator Glean, Stephane BROCKS, RN (Inactive) as Oncology Nurse Navigator Odean Potts, MD as Consulting Physician (Hematology and Oncology) Vanderbilt Ned, MD as Consulting Physician (General Surgery) Dewey Rush, MD as Consulting Physician (Radiation Oncology)  DIAGNOSIS:  Encounter Diagnosis  Name Primary?   Malignant neoplasm of overlapping sites of left breast in female, estrogen receptor positive (HCC) Yes    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)  05/23/2023 Initial Biopsy   Left nipple punch biopsy: Infiltrating carcinoma consistent with breast origin (CK7 GATA3 and ER positive) ER 30%, PR 20%, HER2 1+ (send T4, N0, M0, stage IIIb)    06/02/2023 Breast MRI   Breast MRI: 2.8 cm biopsy-proven malignancy left nipple retroareolar left breast, 5 cm enhancement UOQ left breast posterior superior to the biopsy-proven malignancy, highly suspicious left breast enhancement spanning 9.8 cm.    06/09/2023 Initial Diagnosis   Malignant neoplasm of overlapping sites of left female breast (HCC)   06/10/2023 Cancer Staging   Staging form: Breast, AJCC 8th Edition - Clinical: cT4, cN0, cM0, GX, ER+, PR+, HER2- - Signed by Odean Potts, MD on 06/10/2023 Stage prefix: Initial diagnosis Histologic grading system: 3 grade system   06/18/2023 Initial Biopsy   Left breast biopsy 2:30 position: Grade 2 IDC with DCIS ER 75%, PR 75%, Ki67 20%, HER2 1+ negative   06/23/2023 Imaging   Staging Scans: CT C/A/P :IMPRESSION: 1. Asymmetric masslike breast tissue in the lateral left breast measures 2.2 x 1.2 cm, compatible with known primary breast malignancy. 2. Prominent left axillary lymph nodes measure up to 5 mm in short axis, nonspecific consider attention on dedicated ultrasound. 3. No  convincing evidence of metastatic disease in the  chest, abdomen or pelvis. 4. Ill-defined hypodensity in the pancreatic head/uncinate process, nonspecific but favored to reflect fatty infiltration consider attention on follow-up imaging versus further evaluation by pre and postcontrast enhanced MRC. 5. Nonobstructive 3  mm right interpolar renal stone.  BONE SCAN:  IMPRESSION: 1. No evidence for osseous metastatic disease 2. Degenerative uptake at the left hip, feet and ankles.    07/21/2023 -  Neo-Adjuvant Chemotherapy   Neoadjuvant chemotherapy with dose dense Adriamycin  and Cytoxan  x 4 followed by Taxol  x 12 started 07/21/2023      CHIEF COMPLIANT: Cycle 9 Taxol   HISTORY OF PRESENT ILLNESS:   History of Present Illness Kelsey Carlson is a 58 year old female undergoing treatment for breast cancer who presents for follow-up regarding her chemotherapy regimen.  She experiences vomiting after her last chemotherapy treatment, which she attributes to dietary causes rather than nausea. She emphasizes the absence of nausea before or after the episode.  Nail changes include a fingernail that is about to detach due to repeated trauma and discomfort in her toenails, especially when wearing shoes and compression socks. Occasional mild tingling in her toes occurs, particularly upon waking.  Her chemotherapy dose was previously reduced, and her treatment schedule changed from Monday to Tuesday, necessitating an additional visit.     ALLERGIES:  is allergic to chlorhexidine .  MEDICATIONS:  Current Outpatient Medications  Medication Sig Dispense Refill   albuterol  (VENTOLIN  HFA) 108 (90 Base) MCG/ACT inhaler Inhale 2 puffs into the lungs every 6 (six) hours as needed for wheezing or shortness of breath. 8 g 2   pantoprazole  (PROTONIX ) 40 MG tablet TAKE 1 TABLET  BY MOUTH EVERY DAY 90 tablet 1   No current facility-administered medications for this visit.    PHYSICAL EXAMINATION: ECOG PERFORMANCE STATUS: 1 - Symptomatic but completely  ambulatory  Vitals:   11/11/23 0828  BP: (!) 123/50  Pulse: 100  Resp: 16  Temp: 97.7 F (36.5 C)  SpO2: 99%   Filed Weights   11/11/23 0828  Weight: 178 lb 3.2 oz (80.8 kg)      LABORATORY DATA:  I have reviewed the data as listed    Latest Ref Rng & Units 11/03/2023   10:28 AM 10/27/2023    9:45 AM 10/20/2023    8:40 AM  CMP  Glucose 70 - 99 mg/dL 84  895  843   BUN 6 - 20 mg/dL 8  10  6    Creatinine 0.44 - 1.00 mg/dL 9.36  9.27  9.33   Sodium 135 - 145 mmol/L 140  138  138   Potassium 3.5 - 5.1 mmol/L 3.9  3.9  3.7   Chloride 98 - 111 mmol/L 107  106  107   CO2 22 - 32 mmol/L 27  26  26    Calcium 8.9 - 10.3 mg/dL 9.2  9.2  8.9   Total Protein 6.5 - 8.1 g/dL 6.3  6.2  6.0   Total Bilirubin 0.0 - 1.2 mg/dL 0.4  0.4  0.5   Alkaline Phos 38 - 126 U/L 64  69  73   AST 15 - 41 U/L 19  20  17    ALT 0 - 44 U/L 20  19  20      Lab Results  Component Value Date   WBC 5.1 11/11/2023   HGB 10.4 (L) 11/11/2023   HCT 30.8 (L) 11/11/2023   MCV 97.5 11/11/2023   PLT 324 11/11/2023   NEUTROABS 3.3 11/11/2023    ASSESSMENT & PLAN:  Malignant neoplasm of overlapping sites of left female breast (HCC) (HCC) 05/23/2023: Left nipple punch biopsy: Infiltrating carcinoma consistent with breast origin (CK7 GATA3 and ER positive) ER 30%, PR 20%, HER2 1+ (send T4, N0, M0, stage IIIb) 06/02/2023: Breast MRI: 2.8 cm biopsy-proven malignancy left nipple retroareolar left breast, 5 cm enhancement UOQ left breast posterior superior to the biopsy-proven malignancy, highly suspicious left breast enhancement spanning 9.8 cm. 06/18/2023: Left breast biopsy 2:30 position: Grade 2 IDC with DCIS ER 75%, PR 75%, Ki67 20%, HER2 1+ negative   Treatment plan: Biopsy on the 5 cm of enhancement: Grade 2 IDC, ER 75%, PR 75%, Ki 67: 20%, HER 2 1+ Neg CT scans and bone scan: 06/18/23:Left breast mass 2.2 cm, prom axillary LN, no distant mets, non specific pancreatic head/ uncinate mass 07/02/2023: Oncotype DX: 30  (distant recurrence at 9 years: 19%) Neoadjuvant chemotherapy with dose dense Adriamycin  and Cytoxan  x 4 followed by Taxol  x 12 started 07/21/2023 Mastectomy versus lumpectomy (patient wishes to do bilateral breast reductions if possible): Will be determined by Dr. Vanderbilt Adjuvant radiation Adjuvant antiestrogen therapy ------------------------------------------------------------------------------------------------------------------------------------ Current treatment: Completed 4 cycles of neoadjuvant dose dense Adriamycin  Cytoxan , today is cycle 9 Taxol  07/09/2023: Echo: EF 60 to 65% Chemo toxicities: Constipation: Managed with stool softeners Chemo-induced anemia: Stable: Today's hemoglobin 10.4 Persistent cough: Protonix  and albuterol   Peripheral neuropathy: Tips of all of her fingers: we reduced the dosage of Taxol  to 65 mg/m.  Neuropathy grade 1 Nailbed discoloration and nail changes   Grandson was born today 10/27/2023 Return to clinic in 1 week for cycle 10   No orders of the  defined types were placed in this encounter.  The patient has a good understanding of the overall plan. she agrees with it. she will call with any problems that may develop before the next visit here. Total time spent: 30 mins including face to face time and time spent for planning, charting and co-ordination of care   Naomi MARLA Chad, MD 11/11/23

## 2023-11-17 ENCOUNTER — Inpatient Hospital Stay

## 2023-11-17 VITALS — BP 121/76 | HR 94 | Temp 98.1°F | Resp 18 | Wt 177.5 lb

## 2023-11-17 DIAGNOSIS — Z95828 Presence of other vascular implants and grafts: Secondary | ICD-10-CM

## 2023-11-17 DIAGNOSIS — Z170 Estrogen receptor positive status [ER+]: Secondary | ICD-10-CM

## 2023-11-17 DIAGNOSIS — C50812 Malignant neoplasm of overlapping sites of left female breast: Secondary | ICD-10-CM | POA: Diagnosis not present

## 2023-11-17 LAB — CMP (CANCER CENTER ONLY)
ALT: 22 U/L (ref 0–44)
AST: 18 U/L (ref 15–41)
Albumin: 3.7 g/dL (ref 3.5–5.0)
Alkaline Phosphatase: 65 U/L (ref 38–126)
Anion gap: 6 (ref 5–15)
BUN: 10 mg/dL (ref 6–20)
CO2: 26 mmol/L (ref 22–32)
Calcium: 9.2 mg/dL (ref 8.9–10.3)
Chloride: 105 mmol/L (ref 98–111)
Creatinine: 0.76 mg/dL (ref 0.44–1.00)
GFR, Estimated: >60 mL/min (ref >60)
Glucose, Bld: 112 mg/dL — ABNORMAL HIGH (ref 70–99)
Potassium: 3.5 mmol/L (ref 3.5–5.1)
Sodium: 137 mmol/L (ref 135–145)
Total Bilirubin: 0.6 mg/dL (ref 0.0–1.2)
Total Protein: 6.2 g/dL — ABNORMAL LOW (ref 6.5–8.1)

## 2023-11-17 LAB — CBC WITH DIFFERENTIAL (CANCER CENTER ONLY)
Abs Immature Granulocytes: 0.05 10*3/uL (ref 0.00–0.07)
Basophils Absolute: 0.1 10*3/uL (ref 0.0–0.1)
Basophils Relative: 1 %
Eosinophils Absolute: 0.1 10*3/uL (ref 0.0–0.5)
Eosinophils Relative: 1 %
HCT: 31.9 % — ABNORMAL LOW (ref 36.0–46.0)
Hemoglobin: 10.6 g/dL — ABNORMAL LOW (ref 12.0–15.0)
Immature Granulocytes: 1 %
Lymphocytes Relative: 8 %
Lymphs Abs: 0.6 10*3/uL — ABNORMAL LOW (ref 0.7–4.0)
MCH: 32 pg (ref 26.0–34.0)
MCHC: 33.2 g/dL (ref 30.0–36.0)
MCV: 96.4 fL (ref 80.0–100.0)
Monocytes Absolute: 0.6 10*3/uL (ref 0.1–1.0)
Monocytes Relative: 8 %
Neutro Abs: 6.2 10*3/uL (ref 1.7–7.7)
Neutrophils Relative %: 81 %
Platelet Count: 302 10*3/uL (ref 150–400)
RBC: 3.31 MIL/uL — ABNORMAL LOW (ref 3.87–5.11)
RDW: 13.4 % (ref 11.5–15.5)
WBC Count: 7.6 10*3/uL (ref 4.0–10.5)
nRBC: 0 % (ref 0.0–0.2)

## 2023-11-17 MED ORDER — SODIUM CHLORIDE 0.9% FLUSH
10.0000 mL | INTRAVENOUS | Status: DC | PRN
  Administered 2023-11-17: 10 mL

## 2023-11-17 MED ORDER — DEXAMETHASONE SODIUM PHOSPHATE 10 MG/ML IJ SOLN
4.0000 mg | Freq: Once | INTRAMUSCULAR | Status: AC
  Administered 2023-11-17: 4 mg via INTRAVENOUS
  Filled 2023-11-17: qty 1

## 2023-11-17 MED ORDER — SODIUM CHLORIDE 0.9 % IV SOLN
65.0000 mg/m2 | Freq: Once | INTRAVENOUS | Status: AC
  Administered 2023-11-17: 126 mg via INTRAVENOUS
  Filled 2023-11-17: qty 21

## 2023-11-17 MED ORDER — DIPHENHYDRAMINE HCL 50 MG/ML IJ SOLN
25.0000 mg | Freq: Once | INTRAMUSCULAR | Status: AC
  Administered 2023-11-17: 25 mg via INTRAVENOUS
  Filled 2023-11-17: qty 1

## 2023-11-17 MED ORDER — FAMOTIDINE IN NACL 20-0.9 MG/50ML-% IV SOLN
20.0000 mg | Freq: Once | INTRAVENOUS | Status: AC
  Administered 2023-11-17: 20 mg via INTRAVENOUS
  Filled 2023-11-17: qty 50

## 2023-11-17 MED ORDER — SODIUM CHLORIDE 0.9 % IV SOLN: INTRAVENOUS | Status: DC

## 2023-11-17 MED ORDER — SODIUM CHLORIDE 0.9% FLUSH
10.0000 mL | Freq: Once | INTRAVENOUS | Status: AC
  Administered 2023-11-17: 10 mL

## 2023-11-17 MED ORDER — HEPARIN SOD (PORK) LOCK FLUSH 100 UNIT/ML IV SOLN
500.0000 [IU] | Freq: Once | INTRAVENOUS | Status: AC | PRN
  Administered 2023-11-17: 500 [IU]

## 2023-11-17 NOTE — Patient Instructions (Signed)
 [facility] - A DEPT OF [facility]  Discharge Instructions: Thank you for choosing [facility] to provide your oncology and hematology care.   If you have a lab appointment with the Cancer Center, please go directly to the Cancer Center and check in at the registration area.   Wear comfortable clothing and clothing appropriate for easy access to any Portacath or PICC line.   We strive to give you quality time with your provider. You may need to reschedule your appointment if you arrive late (15 or more minutes).  Arriving late affects you and other patients whose appointments are after yours.  Also, if you miss three or more appointments without notifying the office, you may be dismissed from the clinic at the provider's discretion.      For prescription refill requests, have your pharmacy contact our office and allow 72 hours for refills to be completed.    Today you received the following chemotherapy and/or immunotherapy agent: Paclitaxel (Taxol).      To help prevent nausea and vomiting after your treatment, we encourage you to take your nausea medication as directed.  BELOW ARE SYMPTOMS THAT SHOULD BE REPORTED IMMEDIATELY: *FEVER GREATER THAN 100.4 F (38 C) OR HIGHER *CHILLS OR SWEATING *NAUSEA AND VOMITING THAT IS NOT CONTROLLED WITH YOUR NAUSEA MEDICATION *UNUSUAL SHORTNESS OF BREATH *UNUSUAL BRUISING OR BLEEDING *URINARY PROBLEMS (pain or burning when urinating, or frequent urination) *BOWEL PROBLEMS (unusual diarrhea, constipation, pain near the anus) TENDERNESS IN MOUTH AND THROAT WITH OR WITHOUT PRESENCE OF ULCERS (sore throat, sores in mouth, or a toothache) UNUSUAL RASH, SWELLING OR PAIN  UNUSUAL VAGINAL DISCHARGE OR ITCHING   Items with * indicate a potential emergency and should be followed up as soon as possible or go to the Emergency Department if any problems should occur.  Please show the CHEMOTHERAPY ALERT CARD or  IMMUNOTHERAPY ALERT CARD at check-in to the Emergency Department and triage nurse.  Should you have questions after your visit or need to cancel or reschedule your appointment, please contact [facility] - A DEPT OF [facility]  Dept: [phone]  and follow the prompts.  Office hours are 8:00 a.m. to 4:30 p.m. Monday - Friday. Please note that voicemails left after 4:00 p.m. may not be returned until the following business day.  We are closed weekends and major holidays. You have access to a nurse at all times for urgent questions. Please call the main number to the clinic Dept: [phone] and follow the prompts.   For any non-urgent questions, you may also contact your provider using MyChart. We now offer e-Visits for anyone 108 and older to request care online for non-urgent symptoms. For details visit [email or web address].   Also download the MyChart app! Go to the app store, search "MyChart", open the app, select [facility], and log in with your MyChart username and password.

## 2023-11-23 NOTE — Assessment & Plan Note (Signed)
 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 06/18/2023: Left breast biopsy 2:30 position: Grade 2 IDC with DCIS ER 75%, PR 75%, Ki67 20%, HER2 1+ negative   Treatment plan: Biopsy on the 5 cm of enhancement: Grade 2 IDC, ER 75%, PR 75%, Ki 67: 20%, HER 2 1+ Neg CT scans and bone scan: 06/18/23:Left breast mass 2.2 cm, prom axillary LN, no distant mets, non specific pancreatic head/ uncinate mass 07/02/2023: Oncotype DX: 30 (distant recurrence at 9 years: 19%) Neoadjuvant chemotherapy with dose dense Adriamycin  and Cytoxan  x 4 followed by Taxol  x 12 started 07/21/2023 Mastectomy versus lumpectomy (patient wishes to do bilateral breast reductions if possible): Will be determined by Dr. Vanderbilt Adjuvant radiation Adjuvant antiestrogen therapy ------------------------------------------------------------------------------------------------------------------------------------ Current treatment: Completed 4 cycles of neoadjuvant dose dense Adriamycin  Cytoxan , today is cycle 10 Taxol  07/09/2023: Echo: EF 60 to 65% Chemo toxicities: Constipation: Managed with stool softeners Chemo-induced anemia: Stable: Today's hemoglobin 10.4 Persistent cough: Protonix  and albuterol   Peripheral neuropathy: Tips of all of her fingers: we reduced the dosage of Taxol  to 65 mg/m.  Neuropathy grade 1 Nailbed discoloration and nail changes   Grandson was born 10/27/2023 Return to clinic in 1 week for cycle 11

## 2023-11-24 ENCOUNTER — Encounter: Payer: Self-pay | Admitting: Hematology and Oncology

## 2023-11-24 ENCOUNTER — Inpatient Hospital Stay

## 2023-11-24 ENCOUNTER — Inpatient Hospital Stay (HOSPITAL_BASED_OUTPATIENT_CLINIC_OR_DEPARTMENT_OTHER): Admitting: Hematology and Oncology

## 2023-11-24 ENCOUNTER — Inpatient Hospital Stay: Attending: Hematology and Oncology

## 2023-11-24 VITALS — BP 124/61 | HR 88 | Temp 97.8°F | Resp 17 | Ht 64.0 in | Wt 178.6 lb

## 2023-11-24 DIAGNOSIS — R600 Localized edema: Secondary | ICD-10-CM | POA: Diagnosis not present

## 2023-11-24 DIAGNOSIS — G629 Polyneuropathy, unspecified: Secondary | ICD-10-CM | POA: Diagnosis not present

## 2023-11-24 DIAGNOSIS — Z79899 Other long term (current) drug therapy: Secondary | ICD-10-CM | POA: Insufficient documentation

## 2023-11-24 DIAGNOSIS — C50812 Malignant neoplasm of overlapping sites of left female breast: Secondary | ICD-10-CM | POA: Insufficient documentation

## 2023-11-24 DIAGNOSIS — N200 Calculus of kidney: Secondary | ICD-10-CM | POA: Diagnosis not present

## 2023-11-24 DIAGNOSIS — D6481 Anemia due to antineoplastic chemotherapy: Secondary | ICD-10-CM | POA: Insufficient documentation

## 2023-11-24 DIAGNOSIS — K5900 Constipation, unspecified: Secondary | ICD-10-CM | POA: Insufficient documentation

## 2023-11-24 DIAGNOSIS — N6489 Other specified disorders of breast: Secondary | ICD-10-CM | POA: Diagnosis not present

## 2023-11-24 DIAGNOSIS — Z170 Estrogen receptor positive status [ER+]: Secondary | ICD-10-CM | POA: Insufficient documentation

## 2023-11-24 DIAGNOSIS — Z95828 Presence of other vascular implants and grafts: Secondary | ICD-10-CM

## 2023-11-24 LAB — CBC WITH DIFFERENTIAL (CANCER CENTER ONLY)
Abs Immature Granulocytes: 0.05 K/uL (ref 0.00–0.07)
Basophils Absolute: 0.1 K/uL (ref 0.0–0.1)
Basophils Relative: 1 %
Eosinophils Absolute: 0.1 K/uL (ref 0.0–0.5)
Eosinophils Relative: 2 %
HCT: 31.4 % — ABNORMAL LOW (ref 36.0–46.0)
Hemoglobin: 10.7 g/dL — ABNORMAL LOW (ref 12.0–15.0)
Immature Granulocytes: 1 %
Lymphocytes Relative: 14 %
Lymphs Abs: 0.8 K/uL (ref 0.7–4.0)
MCH: 32.8 pg (ref 26.0–34.0)
MCHC: 34.1 g/dL (ref 30.0–36.0)
MCV: 96.3 fL (ref 80.0–100.0)
Monocytes Absolute: 0.8 K/uL (ref 0.1–1.0)
Monocytes Relative: 14 %
Neutro Abs: 3.9 K/uL (ref 1.7–7.7)
Neutrophils Relative %: 68 %
Platelet Count: 342 K/uL (ref 150–400)
RBC: 3.26 MIL/uL — ABNORMAL LOW (ref 3.87–5.11)
RDW: 13.5 % (ref 11.5–15.5)
WBC Count: 5.8 K/uL (ref 4.0–10.5)
nRBC: 0 % (ref 0.0–0.2)

## 2023-11-24 LAB — CMP (CANCER CENTER ONLY)
ALT: 20 U/L (ref 0–44)
AST: 18 U/L (ref 15–41)
Albumin: 3.6 g/dL (ref 3.5–5.0)
Alkaline Phosphatase: 58 U/L (ref 38–126)
Anion gap: 5 (ref 5–15)
BUN: 7 mg/dL (ref 6–20)
CO2: 27 mmol/L (ref 22–32)
Calcium: 9.3 mg/dL (ref 8.9–10.3)
Chloride: 109 mmol/L (ref 98–111)
Creatinine: 0.61 mg/dL (ref 0.44–1.00)
GFR, Estimated: >60 mL/min (ref >60)
Glucose, Bld: 80 mg/dL (ref 70–99)
Potassium: 3.9 mmol/L (ref 3.5–5.1)
Sodium: 141 mmol/L (ref 135–145)
Total Bilirubin: 0.4 mg/dL (ref 0.0–1.2)
Total Protein: 5.9 g/dL — ABNORMAL LOW (ref 6.5–8.1)

## 2023-11-24 MED ORDER — SODIUM CHLORIDE 0.9 % IV SOLN: INTRAVENOUS | Status: DC

## 2023-11-24 MED ORDER — FAMOTIDINE IN NACL 20-0.9 MG/50ML-% IV SOLN
20.0000 mg | Freq: Once | INTRAVENOUS | Status: AC
  Administered 2023-11-24: 20 mg via INTRAVENOUS
  Filled 2023-11-24: qty 50

## 2023-11-24 MED ORDER — SODIUM CHLORIDE 0.9% FLUSH: 10.0000 mL | INTRAVENOUS | Status: DC | PRN

## 2023-11-24 MED ORDER — SODIUM CHLORIDE 0.9% FLUSH
10.0000 mL | Freq: Once | INTRAVENOUS | Status: AC
  Administered 2023-11-24: 10 mL

## 2023-11-24 MED ORDER — DIPHENHYDRAMINE HCL 50 MG/ML IJ SOLN
25.0000 mg | Freq: Once | INTRAMUSCULAR | Status: AC
  Administered 2023-11-24: 25 mg via INTRAVENOUS
  Filled 2023-11-24: qty 1

## 2023-11-24 MED ORDER — HEPARIN SOD (PORK) LOCK FLUSH 100 UNIT/ML IV SOLN
500.0000 [IU] | Freq: Once | INTRAVENOUS | Status: DC | PRN
Start: 2023-11-24 — End: 2023-11-24

## 2023-11-24 MED ORDER — SODIUM CHLORIDE 0.9 % IV SOLN
65.0000 mg/m2 | Freq: Once | INTRAVENOUS | Status: AC
  Administered 2023-11-24: 126 mg via INTRAVENOUS
  Filled 2023-11-24: qty 21

## 2023-11-24 MED ORDER — DEXAMETHASONE SODIUM PHOSPHATE 10 MG/ML IJ SOLN
4.0000 mg | Freq: Once | INTRAMUSCULAR | Status: AC
  Administered 2023-11-24: 4 mg via INTRAVENOUS
  Filled 2023-11-24: qty 1

## 2023-11-24 NOTE — Progress Notes (Signed)
 Patient Care Team: Avelina Greig BRAVO, MD as PCP - General Tyree Nanetta SAILOR, RN as Oncology Nurse Navigator Glean, Stephane BROCKS, RN (Inactive) as Oncology Nurse Navigator Odean Potts, MD as Consulting Physician (Hematology and Oncology) Vanderbilt Ned, MD as Consulting Physician (General Surgery) Dewey Rush, MD as Consulting Physician (Radiation Oncology)  DIAGNOSIS:  Encounter Diagnosis  Name Primary?   Malignant neoplasm of overlapping sites of left breast in female, estrogen receptor positive (HCC) Yes    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)  05/23/2023 Initial Biopsy   Left nipple punch biopsy: Infiltrating carcinoma consistent with breast origin (CK7 GATA3 and ER positive) ER 30%, PR 20%, HER2 1+ (send T4, N0, M0, stage IIIb)    06/02/2023 Breast MRI   Breast MRI: 2.8 cm biopsy-proven malignancy left nipple retroareolar left breast, 5 cm enhancement UOQ left breast posterior superior to the biopsy-proven malignancy, highly suspicious left breast enhancement spanning 9.8 cm.    06/09/2023 Initial Diagnosis   Malignant neoplasm of overlapping sites of left female breast (HCC)   06/10/2023 Cancer Staging   Staging form: Breast, AJCC 8th Edition - Clinical: cT4, cN0, cM0, GX, ER+, PR+, HER2- - Signed by Odean Potts, MD on 06/10/2023 Stage prefix: Initial diagnosis Histologic grading system: 3 grade system   06/18/2023 Initial Biopsy   Left breast biopsy 2:30 position: Grade 2 IDC with DCIS ER 75%, PR 75%, Ki67 20%, HER2 1+ negative   06/23/2023 Imaging   Staging Scans: CT C/A/P :IMPRESSION: 1. Asymmetric masslike breast tissue in the lateral left breast measures 2.2 x 1.2 cm, compatible with known primary breast malignancy. 2. Prominent left axillary lymph nodes measure up to 5 mm in short axis, nonspecific consider attention on dedicated ultrasound. 3. No  convincing evidence of metastatic disease in the  chest, abdomen or pelvis. 4. Ill-defined hypodensity in the pancreatic head/uncinate process, nonspecific but favored to reflect fatty infiltration consider attention on follow-up imaging versus further evaluation by pre and postcontrast enhanced MRC. 5. Nonobstructive 3  mm right interpolar renal stone.  BONE SCAN:  IMPRESSION: 1. No evidence for osseous metastatic disease 2. Degenerative uptake at the left hip, feet and ankles.    07/21/2023 -  Neo-Adjuvant Chemotherapy   Neoadjuvant chemotherapy with dose dense Adriamycin  and Cytoxan  x 4 followed by Taxol  x 12 started 07/21/2023      CHIEF COMPLIANT: Cycle 11 Taxol   HISTORY OF PRESENT ILLNESS:   History of Present Illness Kelsey Carlson is a 58 year old female with breast cancer who presents for follow-up regarding her upcoming surgery and ongoing symptoms. She was referred by her primary care physician for surgical evaluation after an MRI confirmed breast cancer.  She is scheduled for surgery on Friday, with ongoing coordination with her plastic surgeon. She experiences persistent numbness in the tips of her fingers and toes, which has worsened over time, causing difficulties such as dropping objects. Running cold water over her hands provides some relief, although she avoids it due to feeling cold. She did not wear her compression socks today as her toes were particularly bothersome, and wearing shoes exacerbates the discomfort. No recent fever or infections.  A nail appears to be in poor condition and is likely to fall off. Recent hemoglobin levels have been between 10.6 and 10.7, with white blood cell and platelet counts within normal limits. She has not required any booster shots recently.  Her initial breast cancer detection was complicated  by a mammogram that did not reveal any issues, despite concerns about a 'wonky' nipple. This led to a referral to a surgeon after an MRI confirmed the presence of cancer. She expresses concern about  her family history, noting that her mother experienced a recurrence of cancer despite regular screenings, contributing to her anxiety about her own condition.     ALLERGIES:  is allergic to chlorhexidine .  MEDICATIONS:  Current Outpatient Medications  Medication Sig Dispense Refill   albuterol  (VENTOLIN  HFA) 108 (90 Base) MCG/ACT inhaler Inhale 2 puffs into the lungs every 6 (six) hours as needed for wheezing or shortness of breath. 8 g 2   pantoprazole  (PROTONIX ) 40 MG tablet TAKE 1 TABLET BY MOUTH EVERY DAY 90 tablet 1   No current facility-administered medications for this visit.    PHYSICAL EXAMINATION: ECOG PERFORMANCE STATUS: 1 - Symptomatic but completely ambulatory  Vitals:   11/24/23 1123  BP: 124/61  Pulse: 88  Resp: 17  Temp: 97.8 F (36.6 C)  SpO2: 100%   Filed Weights   11/24/23 1123  Weight: 178 lb 9.6 oz (81 kg)    Physical Exam Discoloration of the nailbeds especially the right middle finger nail appears to be completely white and with blackish marks its possibly likely to fall.  (exam performed in the presence of a chaperone)  LABORATORY DATA:  I have reviewed the data as listed    Latest Ref Rng & Units 11/17/2023    9:32 AM 11/11/2023    8:16 AM 11/03/2023   10:28 AM  CMP  Glucose 70 - 99 mg/dL 887  872  84   BUN 6 - 20 mg/dL 10  8  8    Creatinine 0.44 - 1.00 mg/dL 9.23  9.29  9.36   Sodium 135 - 145 mmol/L 137  140  140   Potassium 3.5 - 5.1 mmol/L 3.5  3.6  3.9   Chloride 98 - 111 mmol/L 105  108  107   CO2 22 - 32 mmol/L 26  26  27    Calcium 8.9 - 10.3 mg/dL 9.2  9.1  9.2   Total Protein 6.5 - 8.1 g/dL 6.2  6.0  6.3   Total Bilirubin 0.0 - 1.2 mg/dL 0.6  0.4  0.4   Alkaline Phos 38 - 126 U/L 65  63  64   AST 15 - 41 U/L 18  19  19    ALT 0 - 44 U/L 22  20  20      Lab Results  Component Value Date   WBC 7.6 11/17/2023   HGB 10.6 (L) 11/17/2023   HCT 31.9 (L) 11/17/2023   MCV 96.4 11/17/2023   PLT 302 11/17/2023   NEUTROABS 6.2  11/17/2023    ASSESSMENT & PLAN:  Malignant neoplasm of overlapping sites of left female breast (HCC) 05/23/2023: Left nipple punch biopsy: Infiltrating carcinoma consistent with breast origin (CK7 GATA3 and ER positive) ER 30%, PR 20%, HER2 1+ (send T4, N0, M0, stage IIIb) 06/02/2023: Breast MRI: 2.8 cm biopsy-proven malignancy left nipple retroareolar left breast, 5 cm enhancement UOQ left breast posterior superior to the biopsy-proven malignancy, highly suspicious left breast enhancement spanning 9.8 cm. 06/18/2023: Left breast biopsy 2:30 position: Grade 2 IDC with DCIS ER 75%, PR 75%, Ki67 20%, HER2 1+ negative   Treatment plan: Biopsy on the 5 cm of enhancement: Grade 2 IDC, ER 75%, PR 75%, Ki 67: 20%, HER 2 1+ Neg CT scans and bone scan: 06/18/23:Left breast mass 2.2 cm,  prom axillary LN, no distant mets, non specific pancreatic head/ uncinate mass 07/02/2023: Oncotype DX: 30 (distant recurrence at 9 years: 19%) Neoadjuvant chemotherapy with dose dense Adriamycin  and Cytoxan  x 4 followed by Taxol  x 12 started 07/21/2023 Mastectomy versus lumpectomy (patient wishes to do bilateral breast reductions if possible): Will be determined by Dr. Vanderbilt Adjuvant radiation Adjuvant antiestrogen therapy ------------------------------------------------------------------------------------------------------------------------------------ Current treatment: Completed 4 cycles of neoadjuvant dose dense Adriamycin  Cytoxan , today is cycle 11 Taxol  07/09/2023: Echo: EF 60 to 65% Chemo toxicities: Constipation: Managed with stool softeners Chemo-induced anemia: Stable: Today's hemoglobin 10.4 Persistent cough: Protonix  and albuterol   Peripheral neuropathy: Tips of all of her fingers: we reduced the dosage of Taxol  to 65 mg/m.  Neuropathy grade 1 Nailbed discoloration and nail changes   Grandson was born 10/27/2023 Return to clinic in 1 week for cycle 12  In terms of cancer surveillance: Because her mammograms  missed diagnosing her breast cancer.  We would probably do mammograms alternating with breast MRIs for the first 2 years and then switch to contrast-enhanced mammograms after that.  We would also likely be doing guardant reveal.   Assessment & Plan Breast cancer Breast cancer with MRI planned to assess tumor shrinkage and surgical planning. Surgery scheduled, followed by radiation therapy to reduce recurrence risk. Future surveillance includes alternating MRIs and contrast-enhanced mammograms. Blood tests every six months to monitor for cancer DNA. - Perform MRI on Wednesday to assess tumor shrinkage. - Proceed with surgery on Friday. - Coordinate with plastic surgeon regarding surgery date. - Plan for radiation therapy 4-6 weeks post-surgery, 5 times a week. - Discuss future surveillance with alternating MRIs and contrast-enhanced mammograms. - Conduct blood tests every six months to monitor for cancer DNA.  Peripheral neuropathy Peripheral neuropathy with numbness in fingers and toes, slightly worsening. No significant functional impairment, symptoms manageable but bothersome.      No orders of the defined types were placed in this encounter.  The patient has a good understanding of the overall plan. she agrees with it. she will call with any problems that may develop before the next visit here. Total time spent: 30 mins including face to face time and time spent for planning, charting and co-ordination of care   Viinay K Takahiro Godinho, MD 11/24/23

## 2023-11-24 NOTE — Research (Signed)
 [id number], ICE COMPRESS: RANDOMIZED TRIAL OF LIMB CRYOCOMPRESSION VERSUS CONTINUOUS COMPRESSION VERSUS LOW CYCLIC COMPRESSION FOR THE PREVENTION  OF TAXANE-INDUCED PERIPHERAL NEUROPATHY  Research lab orders placed for 12-01-23.  This will be drawn in port area with her other labs.   Delon Pinal BSN RN Clinical Research Nurse [facility] Direct Dial: [phone] 11/24/2023  1:19 PM

## 2023-11-24 NOTE — Patient Instructions (Signed)

## 2023-12-01 ENCOUNTER — Inpatient Hospital Stay (HOSPITAL_BASED_OUTPATIENT_CLINIC_OR_DEPARTMENT_OTHER): Admitting: Hematology and Oncology

## 2023-12-01 ENCOUNTER — Inpatient Hospital Stay

## 2023-12-01 ENCOUNTER — Encounter: Payer: Self-pay | Admitting: *Deleted

## 2023-12-01 VITALS — BP 120/53 | HR 76 | Temp 97.9°F | Resp 17 | Ht 64.0 in | Wt 177.9 lb

## 2023-12-01 DIAGNOSIS — C50812 Malignant neoplasm of overlapping sites of left female breast: Secondary | ICD-10-CM

## 2023-12-01 DIAGNOSIS — Z170 Estrogen receptor positive status [ER+]: Secondary | ICD-10-CM

## 2023-12-01 DIAGNOSIS — Z95828 Presence of other vascular implants and grafts: Secondary | ICD-10-CM

## 2023-12-01 LAB — CMP (CANCER CENTER ONLY)
ALT: 22 U/L (ref 0–44)
AST: 18 U/L (ref 15–41)
Albumin: 3.5 g/dL (ref 3.5–5.0)
Alkaline Phosphatase: 61 U/L (ref 38–126)
Anion gap: 4 — ABNORMAL LOW (ref 5–15)
BUN: 10 mg/dL (ref 6–20)
CO2: 27 mmol/L (ref 22–32)
Calcium: 9.2 mg/dL (ref 8.9–10.3)
Chloride: 110 mmol/L (ref 98–111)
Creatinine: 0.64 mg/dL (ref 0.44–1.00)
GFR, Estimated: >60 mL/min (ref >60)
Glucose, Bld: 103 mg/dL — ABNORMAL HIGH (ref 70–99)
Potassium: 3.9 mmol/L (ref 3.5–5.1)
Sodium: 141 mmol/L (ref 135–145)
Total Bilirubin: 0.3 mg/dL (ref 0.0–1.2)
Total Protein: 5.7 g/dL — ABNORMAL LOW (ref 6.5–8.1)

## 2023-12-01 LAB — CBC WITH DIFFERENTIAL (CANCER CENTER ONLY)
Abs Immature Granulocytes: 0.06 K/uL (ref 0.00–0.07)
Basophils Absolute: 0.1 K/uL (ref 0.0–0.1)
Basophils Relative: 1 %
Eosinophils Absolute: 0.1 K/uL (ref 0.0–0.5)
Eosinophils Relative: 3 %
HCT: 31.5 % — ABNORMAL LOW (ref 36.0–46.0)
Hemoglobin: 10.6 g/dL — ABNORMAL LOW (ref 12.0–15.0)
Immature Granulocytes: 1 %
Lymphocytes Relative: 23 %
Lymphs Abs: 1 K/uL (ref 0.7–4.0)
MCH: 32.1 pg (ref 26.0–34.0)
MCHC: 33.7 g/dL (ref 30.0–36.0)
MCV: 95.5 fL (ref 80.0–100.0)
Monocytes Absolute: 0.6 K/uL (ref 0.1–1.0)
Monocytes Relative: 14 %
Neutro Abs: 2.6 K/uL (ref 1.7–7.7)
Neutrophils Relative %: 58 %
Platelet Count: 330 K/uL (ref 150–400)
RBC: 3.3 MIL/uL — ABNORMAL LOW (ref 3.87–5.11)
RDW: 13.5 % (ref 11.5–15.5)
WBC Count: 4.5 K/uL (ref 4.0–10.5)
nRBC: 0 % (ref 0.0–0.2)

## 2023-12-01 LAB — RESEARCH LABS

## 2023-12-01 MED ORDER — SODIUM CHLORIDE 0.9% FLUSH
10.0000 mL | INTRAVENOUS | Status: DC | PRN
  Administered 2023-12-01: 10 mL

## 2023-12-01 MED ORDER — SODIUM CHLORIDE 0.9% FLUSH
10.0000 mL | Freq: Once | INTRAVENOUS | Status: AC
  Administered 2023-12-01: 10 mL

## 2023-12-01 MED ORDER — DEXAMETHASONE SODIUM PHOSPHATE 10 MG/ML IJ SOLN
4.0000 mg | Freq: Once | INTRAMUSCULAR | Status: AC
  Administered 2023-12-01: 4 mg via INTRAVENOUS
  Filled 2023-12-01: qty 1

## 2023-12-01 MED ORDER — SODIUM CHLORIDE 0.9 % IV SOLN
65.0000 mg/m2 | Freq: Once | INTRAVENOUS | Status: AC
  Administered 2023-12-01: 126 mg via INTRAVENOUS
  Filled 2023-12-01: qty 21

## 2023-12-01 MED ORDER — SODIUM CHLORIDE 0.9 % IV SOLN: INTRAVENOUS | Status: DC

## 2023-12-01 MED ORDER — HEPARIN SOD (PORK) LOCK FLUSH 100 UNIT/ML IV SOLN
500.0000 [IU] | Freq: Once | INTRAVENOUS | Status: AC | PRN
  Administered 2023-12-01: 500 [IU]

## 2023-12-01 MED ORDER — DIPHENHYDRAMINE HCL 50 MG/ML IJ SOLN
25.0000 mg | Freq: Once | INTRAMUSCULAR | Status: AC
  Administered 2023-12-01: 25 mg via INTRAVENOUS
  Filled 2023-12-01: qty 1

## 2023-12-01 MED ORDER — FAMOTIDINE IN NACL 20-0.9 MG/50ML-% IV SOLN
20.0000 mg | Freq: Once | INTRAVENOUS | Status: AC
  Administered 2023-12-01: 20 mg via INTRAVENOUS
  Filled 2023-12-01: qty 50

## 2023-12-01 NOTE — Progress Notes (Signed)
 Patient Care Team: Avelina Greig BRAVO, MD as PCP - General Tyree Nanetta SAILOR, RN as Oncology Nurse Navigator Glean, Stephane BROCKS, RN (Inactive) as Oncology Nurse Navigator Odean Potts, MD as Consulting Physician (Hematology and Oncology) Vanderbilt Ned, MD as Consulting Physician (General Surgery) Dewey Rush, MD as Consulting Physician (Radiation Oncology)  DIAGNOSIS:  Encounter Diagnosis  Name Primary?   Malignant neoplasm of overlapping sites of left breast in female, estrogen receptor positive (HCC) Yes    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)  05/23/2023 Initial Biopsy   Left nipple punch biopsy: Infiltrating carcinoma consistent with breast origin (CK7 GATA3 and ER positive) ER 30%, PR 20%, HER2 1+ (send T4, N0, M0, stage IIIb)    06/02/2023 Breast MRI   Breast MRI: 2.8 cm biopsy-proven malignancy left nipple retroareolar left breast, 5 cm enhancement UOQ left breast posterior superior to the biopsy-proven malignancy, highly suspicious left breast enhancement spanning 9.8 cm.    06/09/2023 Initial Diagnosis   Malignant neoplasm of overlapping sites of left female breast (HCC)   06/10/2023 Cancer Staging   Staging form: Breast, AJCC 8th Edition - Clinical: cT4, cN0, cM0, GX, ER+, PR+, HER2- - Signed by Odean Potts, MD on 06/10/2023 Stage prefix: Initial diagnosis Histologic grading system: 3 grade system   06/18/2023 Initial Biopsy   Left breast biopsy 2:30 position: Grade 2 IDC with DCIS ER 75%, PR 75%, Ki67 20%, HER2 1+ negative   06/23/2023 Imaging   Staging Scans: CT C/A/P :IMPRESSION: 1. Asymmetric masslike breast tissue in the lateral left breast measures 2.2 x 1.2 cm, compatible with known primary breast malignancy. 2. Prominent left axillary lymph nodes measure up to 5 mm in short axis, nonspecific consider attention on dedicated ultrasound. 3. No  convincing evidence of metastatic disease in the  chest, abdomen or pelvis. 4. Ill-defined hypodensity in the pancreatic head/uncinate process, nonspecific but favored to reflect fatty infiltration consider attention on follow-up imaging versus further evaluation by pre and postcontrast enhanced MRC. 5. Nonobstructive 3  mm right interpolar renal stone.  BONE SCAN:  IMPRESSION: 1. No evidence for osseous metastatic disease 2. Degenerative uptake at the left hip, feet and ankles.    07/21/2023 -  Neo-Adjuvant Chemotherapy   Neoadjuvant chemotherapy with dose dense Adriamycin  and Cytoxan  x 4 followed by Taxol  x 12 started 07/21/2023      CHIEF COMPLIANT: Cycle 12 Taxol   HISTORY OF PRESENT ILLNESS:  History of Present Illness Kelsey Carlson is a 58 year old female who presents for follow-up after completing her final treatment for breast cancer.  She experiences numbness and tingling in the tips of her fingers and toes, with increasing numbness. An MRI is scheduled for tomorrow. Her nails are slightly affected but have remained intact during treatment. She did not lose all her hair and is hopeful for regrowth. She plans to return to work soon and is concerned about potential fatigue during an upcoming trip to the beach.     ALLERGIES:  is allergic to chlorhexidine .  MEDICATIONS:  Current Outpatient Medications  Medication Sig Dispense Refill   albuterol  (VENTOLIN  HFA) 108 (90 Base) MCG/ACT inhaler Inhale 2 puffs into the lungs every 6 (six) hours as needed for wheezing or shortness of breath. 8 g 2   pantoprazole  (PROTONIX ) 40 MG tablet TAKE 1 TABLET BY MOUTH EVERY DAY 90 tablet 1   No current facility-administered medications for this visit.    PHYSICAL EXAMINATION: ECOG  PERFORMANCE STATUS: 1 - Symptomatic but completely ambulatory  Vitals:   12/01/23 1049  BP: (!) 120/53  Pulse: 76  Resp: 17  Temp: 97.9 F (36.6 C)  SpO2: 100%   Filed Weights   12/01/23 1049  Weight: 177 lb 14.4 oz (80.7 kg)     LABORATORY DATA:  I  have reviewed the data as listed    Latest Ref Rng & Units 11/24/2023   11:06 AM 11/17/2023    9:32 AM 11/11/2023    8:16 AM  CMP  Glucose 70 - 99 mg/dL 80  887  872   BUN 6 - 20 mg/dL 7  10  8    Creatinine 0.44 - 1.00 mg/dL 9.38  9.23  9.29   Sodium 135 - 145 mmol/L 141  137  140   Potassium 3.5 - 5.1 mmol/L 3.9  3.5  3.6   Chloride 98 - 111 mmol/L 109  105  108   CO2 22 - 32 mmol/L 27  26  26    Calcium 8.9 - 10.3 mg/dL 9.3  9.2  9.1   Total Protein 6.5 - 8.1 g/dL 5.9  6.2  6.0   Total Bilirubin 0.0 - 1.2 mg/dL 0.4  0.6  0.4   Alkaline Phos 38 - 126 U/L 58  65  63   AST 15 - 41 U/L 18  18  19    ALT 0 - 44 U/L 20  22  20      Lab Results  Component Value Date   WBC 5.8 11/24/2023   HGB 10.7 (L) 11/24/2023   HCT 31.4 (L) 11/24/2023   MCV 96.3 11/24/2023   PLT 342 11/24/2023   NEUTROABS 3.9 11/24/2023    ASSESSMENT & PLAN:  Malignant neoplasm of overlapping sites of left female breast (HCC) 05/23/2023: Left nipple punch biopsy: Infiltrating carcinoma consistent with breast origin (CK7 GATA3 and ER positive) ER 30%, PR 20%, HER2 1+ (send T4, N0, M0, stage IIIb) 06/02/2023: Breast MRI: 2.8 cm biopsy-proven malignancy left nipple retroareolar left breast, 5 cm enhancement UOQ left breast posterior superior to the biopsy-proven malignancy, highly suspicious left breast enhancement spanning 9.8 cm. 06/18/2023: Left breast biopsy 2:30 position: Grade 2 IDC with DCIS ER 75%, PR 75%, Ki67 20%, HER2 1+ negative   Treatment plan: Biopsy on the 5 cm of enhancement: Grade 2 IDC, ER 75%, PR 75%, Ki 67: 20%, HER 2 1+ Neg CT scans and bone scan: 06/18/23:Left breast mass 2.2 cm, prom axillary LN, no distant mets, non specific pancreatic head/ uncinate mass 07/02/2023: Oncotype DX: 30 (distant recurrence at 9 years: 19%) Neoadjuvant chemotherapy with dose dense Adriamycin  and Cytoxan  x 4 followed by Taxol  x 12 started 07/21/2023 Mastectomy versus lumpectomy (patient wishes to do bilateral breast  reductions if possible):   Dr. Vanderbilt and Dr. Arelia Adjuvant radiation Adjuvant antiestrogen therapy ------------------------------------------------------------------------------------------------------------------------------------ Current treatment: Completed 4 cycles of neoadjuvant dose dense Adriamycin  Cytoxan , today is cycle 12 Taxol  07/09/2023: Echo: EF 60 to 65% Chemo toxicities: Constipation: Managed with stool softeners Chemo-induced anemia: Stable: Today's hemoglobin 10.4 Persistent cough: Protonix  and albuterol   Peripheral neuropathy: Tips of all of her fingers: we reduced the dosage of Taxol  to 65 mg/m.  Neuropathy grade 1 Nailbed discoloration and nail changes   Darden was born 10/27[birth year]20[birth year] She is going to the beach to celebrate completion of chemo   Breast Cancer surveillance: Because her mammograms missed diagnosing her breast cancer.  We would probably do mammograms alternating with breast MRIs for the first 2 years  and then switch to contrast-enhanced mammograms after that.  We would also likely be doing guardant reveal.  Return to clinic after surgery to discuss upon pathology report     No orders of the defined types were placed in this encounter.  The patient has a good understanding of the overall plan. she agrees with it. she will call with any problems that may develop before the next visit here. Total time spent: 30 mins including face to face time and time spent for planning, charting and co-ordination of care   Viinay K Isidore Margraf, MD 12/01/23

## 2023-12-01 NOTE — Assessment & Plan Note (Signed)
 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 06/18/2023: Left breast biopsy 2:30 position: Grade 2 IDC with DCIS ER 75%, PR 75%, Ki67 20%, HER2 1+ negative   Treatment plan: Biopsy on the 5 cm of enhancement: Grade 2 IDC, ER 75%, PR 75%, Ki 67: 20%, HER 2 1+ Neg CT scans and bone scan: 06/18/23:Left breast mass 2.2 cm, prom axillary LN, no distant mets, non specific pancreatic head/ uncinate mass 07/02/2023: Oncotype DX: 30 (distant recurrence at 9 years: 19%) Neoadjuvant chemotherapy with dose dense Adriamycin  and Cytoxan  x 4 followed by Taxol  x 12 started 07/21/2023 Mastectomy versus lumpectomy (patient wishes to do bilateral breast reductions if possible): Will be determined by Dr. Vanderbilt Adjuvant radiation Adjuvant antiestrogen therapy ------------------------------------------------------------------------------------------------------------------------------------ Current treatment: Completed 4 cycles of neoadjuvant dose dense Adriamycin  Cytoxan , today is cycle 12 Taxol  07/09/2023: Echo: EF 60 to 65% Chemo toxicities: Constipation: Managed with stool softeners Chemo-induced anemia: Stable: Today's hemoglobin 10.4 Persistent cough: Protonix  and albuterol   Peripheral neuropathy: Tips of all of her fingers: we reduced the dosage of Taxol  to 65 mg/m.  Neuropathy grade 1 Nailbed discoloration and nail changes   Grandson was born 10/27/2023    Breast Cancer surveillance: Because her mammograms missed diagnosing her breast cancer.  We would probably do mammograms alternating with breast MRIs for the first 2 years and then switch to  contrast-enhanced mammograms after that.  We would also likely be doing guardant reveal.  Return to clinic after radiation is complete

## 2023-12-01 NOTE — Patient Instructions (Signed)

## 2023-12-01 NOTE — Research (Signed)
 [id number], ICE COMPRESS: RANDOMIZED TRIAL OF LIMB CRYOCOMPRESSION VERSUS CONTINUOUS COMPRESSION VERSUS LOW CYCLIC COMPRESSION FOR THE PREVENTION  OF TAXANE-INDUCED PERIPHERAL NEUROPATHY    Patient arrives today for the Cycle 12 Week 12 visit. Confirmed patient does not have wounds, sores, or lesions to extremities. Patient has not had any vaccinations since last visit.       Week 12 Assessments:    PROs: Provided to patient at registration prior to her other appointments.  Collected and checked for completeness and accruracy.    Labs: Optional labs were drawn today.   Neuropathy Assessments: Neuropen, T[place] and Timed Get Up and Go tests performed by Research Nurse Mazie Larsen, RN with assistance recording by Research Nurse Levon Sandifer, RN.    Supplemental Agents, Prescription Medications and Complementary Medicine:  Medication list reviewed with patient. She has not taken duloxetine or any vitamins or supplements for symptom management of peripheral neuropathy since starting on study.  Patient has not received any complementary or alternative therapies for symptom management of peripheral neuropathy since starting on study.      ADVERSE EVENTS: Solicited AEs reviewed with patient. See AE table below.   Patient reports Sensory Neuropathy symptoms (intermittent numbness and tingling in tips of all fingers and toes with increasing numbness). She denies any symptoms of motor neuropathy. Dr. 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    Other AEs are also noted below and none of them are related to study intervention per Dr. Odean.   She reports nail color changes of discoloration.  Patient reports redness of skin on both her hands and face. See AE table below.    BASELINE AEs Adverse Event CTCAE Grade Onset date Resolved date Relationship to Study Intervention Relationship to Taxane Action Taken Comments  Skin atrophy (solicited) 0               Skin hyperpigmentation (solicited) 0              Skin hypopigmentation (solicited) 0              Skin induration (solicited) 0              Skin ulceration (solicited) 0              Rash maculopapular (solicited) 0              Nail changes (solicited) Present    10/27/23  Ongoing  Not related Definitely related to Taxane Per Dr. Gudena.    Sensitive big toe nail on right foot. Pt is concern with toe nail falling off.    Cold intolerance (solicited) (general disorders and administration site conditions- other) 0              Frostbite (solicited) (skin and subcutaneous tissue disorders- other) 0              Nail Discoloration 1  10/27/2023  Ongoing Not Related. Definitely related to Taxane Per Dr. Odean.    Brown spots and lines on nail bed.  Sensory Peripheral Neuropathy 1 10/14/2023 per Dr. Gara Note.  Ongoing Not Related. Definitely related to Taxane Per Dr. Odean.   See note above for description.      STUDY INTERVENTION & TOLERABILITY ASSESSMENTS:  Off Device. Pt has is not currently using Paxman study device on study.   PLAN:  The patient was thanked for their time . She understands research will meet her again at next scheduled  assessment appointment this will be for the 24 wk assessment. Patient Jaden Batchelder. Devargas has been provided direct contact information and is encouraged to contact this Coordinator for any needs or questions.   Mazie Larsen, RN, BSN Clinical Research Nurse [phone] 12/01/2023

## 2023-12-02 ENCOUNTER — Ambulatory Visit
Admission: RE | Admit: 2023-12-02 | Discharge: 2023-12-02 | Disposition: A | Discharge status: Home / Self Care | Source: Ambulatory Visit | Attending: Hematology and Oncology | Admitting: Hematology and Oncology

## 2023-12-02 ENCOUNTER — Encounter: Payer: Self-pay | Admitting: Hematology and Oncology

## 2023-12-02 DIAGNOSIS — Z170 Estrogen receptor positive status [ER+]: Secondary | ICD-10-CM

## 2023-12-02 MED ORDER — GADOPICLENOL 0.5 MMOL/ML IV SOLN
7.5000 mL | Freq: Once | INTRAVENOUS | Status: AC | PRN
  Administered 2023-12-02: 7.5 mL via INTRAVENOUS

## 2023-12-04 ENCOUNTER — Encounter: Payer: Self-pay | Admitting: Hematology and Oncology

## 2023-12-04 ENCOUNTER — Encounter: Payer: Self-pay | Admitting: *Deleted

## 2023-12-05 ENCOUNTER — Encounter: Payer: Self-pay | Admitting: Hematology and Oncology

## 2023-12-05 ENCOUNTER — Ambulatory Visit: Payer: Self-pay | Admitting: Surgery

## 2023-12-05 DIAGNOSIS — C50912 Malignant neoplasm of unspecified site of left female breast: Secondary | ICD-10-CM

## 2023-12-10 ENCOUNTER — Other Ambulatory Visit: Payer: Self-pay | Admitting: Surgery

## 2023-12-10 DIAGNOSIS — C50912 Malignant neoplasm of unspecified site of left female breast: Secondary | ICD-10-CM

## 2023-12-11 ENCOUNTER — Encounter: Payer: Self-pay | Admitting: *Deleted

## 2023-12-11 ENCOUNTER — Other Ambulatory Visit: Payer: Self-pay

## 2023-12-11 DIAGNOSIS — C50812 Malignant neoplasm of overlapping sites of left female breast: Secondary | ICD-10-CM

## 2023-12-12 ENCOUNTER — Other Ambulatory Visit: Payer: Self-pay

## 2023-12-13 ENCOUNTER — Other Ambulatory Visit: Payer: Self-pay

## 2023-12-15 ENCOUNTER — Telehealth: Payer: Self-pay

## 2023-12-15 NOTE — Progress Notes (Unsigned)
 [facility]   [phone] [phone]    Patient Care Team: Avelina Greig BRAVO, MD as PCP - General Tyree Nanetta SAILOR, RN as Oncology Nurse Navigator Glean, Dawn C, RN (Inactive) as Oncology Nurse Navigator Odean Potts, MD as Consulting Physician (Hematology and Oncology) Vanderbilt Ned, MD as Consulting Physician (General Surgery) Dewey Rush, MD as Consulting Physician (Radiation Oncology)   CHIEF COMPLAINT: Leg swelling, recent neoadjuvant chemo for breast cancer  Oncology History  Malignant neoplasm of overlapping sites of left female breast (HCC)  05/23/2023 Initial Biopsy   Left nipple punch biopsy: Infiltrating carcinoma consistent with breast origin (CK7 GATA3 and ER positive) ER 30%, PR 20%, HER2 1+ (send T4, N0, M0, stage IIIb)    06/02/2023 Breast MRI   Breast MRI: 2.8 cm biopsy-proven malignancy left nipple retroareolar left breast, 5 cm enhancement UOQ left breast posterior superior to the biopsy-proven malignancy, highly suspicious left breast enhancement spanning 9.8 cm.    06/09/2023 Initial Diagnosis   Malignant neoplasm of overlapping sites of left female breast (HCC)   06/10/2023 Cancer Staging   Staging form: Breast, AJCC 8th Edition - Clinical: cT4, cN0, cM0, GX, ER+, PR+, HER2- - Signed by Odean Potts, MD on 06/10/2023 Stage prefix: Initial diagnosis Histologic grading system: 3 grade system   06/18/2023 Initial Biopsy   Left breast biopsy 2:30 position: Grade 2 IDC with DCIS ER 75%, PR 75%, Ki67 20%, HER2 1+ negative   06/23/2023 Imaging   Staging Scans: CT C/A/P :IMPRESSION: 1. Asymmetric masslike breast tissue in the lateral left breast measures 2.2 x 1.2 cm, compatible with known primary breast malignancy. 2. Prominent left axillary lymph nodes measure up to 5 mm in short axis, nonspecific consider attention on dedicated ultrasound. 3. No  convincing evidence of metastatic disease in the chest, abdomen or pelvis. 4.  Ill-defined hypodensity in the pancreatic head/uncinate process, nonspecific but favored to reflect fatty infiltration consider attention on follow-up imaging versus further evaluation by pre and postcontrast enhanced MRC. 5. Nonobstructive 3  mm right interpolar renal stone.  BONE SCAN:  IMPRESSION: 1. No evidence for osseous metastatic disease 2. Degenerative uptake at the left hip, feet and ankles.    07/21/2023 -  Neo-Adjuvant Chemotherapy   Neoadjuvant chemotherapy with dose dense Adriamycin  and Cytoxan  x 4 followed by Taxol  x 12 started 07/21/2023       CURRENT THERAPY: S/p neoadjuvant chemo, pending surgical resection  INTERVAL HISTORY Ms. Llamas presents for symptom management visit.  She completed neoadjuvant chemo 7/14 and tolerated well.  She feels tired.  In the past week she has developed lower leg swelling.  She attempted to use compression stockings over the weekend but had tenderness in the calf when pulling them up.  Denies calf pain currently.  She has some coughing at night.  Was given an inhaler during chemo for allergies and used it over the weekend which helped.  Denies chest pain or dyspnea.  Does not overly salt her foods.  Has not started new medication.  Denies injury/trauma or fall.  Denies significant weight gain.  ROS  All other systems reviewed and negative  Past Medical History:  Diagnosis Date   Arthritis    Gallstones    History of kidney stones    Lumbar back pain 06/28/2013   Nephrolithiasis    Urolithiasis    Wears glasses      Past Surgical History:  Procedure Laterality Date   ANKLE ARTHRODESIS  1981  left ankle bone tumor-   BLADDER SUSPENSION  2008   sling-cysto   breast biopsy  2002   lt br bx   BREAST CYST EXCISION Left 02/19/2013   Procedure:  EXCISION BREAST MASS;  Surgeon: Debby A. Cornett, MD;  Location: [facility];  Service: General;  Laterality: Left;   BREAST EXCISIONAL BIOPSY Left 2014   benign   BREAST SURGERY      CHOLECYSTECTOMY N/A 08/27/2017   Procedure: LAPAROSCOPIC CHOLECYSTECTOMY WITH INTRAOPERATIVE CHOLANGIOGRAM;  Surgeon: Curvin Deward MOULD, MD;  Location: [facility] OR;  Service: General;  Laterality: N/A;   DILATION AND CURETTAGE OF UTERUS     IR IMAGING GUIDED PORT INSERTION  07/15/2023   LUMBAR DISC SURGERY  1997   lumb lam   TUBAL LIGATION     Essure     Outpatient Encounter Medications as of 12/16/2023  Medication Sig   albuterol  (VENTOLIN  HFA) 108 (90 Base) MCG/ACT inhaler Inhale 2 puffs into the lungs every 6 (six) hours as needed for wheezing or shortness of breath.   pantoprazole  (PROTONIX ) 40 MG tablet TAKE 1 TABLET BY MOUTH EVERY DAY   No facility-administered encounter medications on file as of 12/16/2023.     Today's Vitals   12/16/23 0924 12/16/23 1026  BP: 130/80   Pulse: 85   Resp: 17   Temp: 98.2 F (36.8 C)   SpO2: 98%   Weight: 178 lb 4.8 oz (80.9 kg)   PainSc:  0-No pain   Body mass index is 30.61 kg/m.   ECOG PERFORMANCE STATUS: 1 - Symptomatic but completely ambulatory  PHYSICAL EXAM GENERAL:alert, no distress and comfortable SKIN: no rash  EYES: sclera clear NECK: without mass LUNGS: clear with normal breathing effort HEART: regular rate & rhythm, 1+ pitting bilateral lower extremity edema NEURO: alert & oriented x 3 with fluent speech, no focal motor/sensory deficits Breast exam: Deferred   CBC    Latest Ref Rng & Units 12/01/2023   10:29 AM 11/24/2023   11:06 AM 11/17/2023    9:32 AM  CBC  WBC 4.0 - 10.5 K/uL 4.5  5.8  7.6   Hemoglobin 12.0 - 15.0 g/dL 89.3  89.2  89.3   Hematocrit 36.0 - 46.0 % 31.5  31.4  31.9   Platelets 150 - 400 K/uL 330  342  302       CMP     Latest Ref Rng & Units 12/01/2023   10:29 AM 11/24/2023   11:06 AM 11/17/2023    9:32 AM  CMP  Glucose 70 - 99 mg/dL 896  80  887   BUN 6 - 20 mg/dL 10  7  10    Creatinine 0.44 - 1.00 mg/dL 9.35  9.38  9.23   Sodium 135 - 145 mmol/L 141  141  137   Potassium 3.5 - 5.1 mmol/L 3.9  3.9   3.5   Chloride 98 - 111 mmol/L 110  109  105   CO2 22 - 32 mmol/L 27  27  26    Calcium 8.9 - 10.3 mg/dL 9.2  9.3  9.2   Total Protein 6.5 - 8.1 g/dL 5.7  5.9  6.2   Total Bilirubin 0.0 - 1.2 mg/dL 0.3  0.4  0.6   Alkaline Phos 38 - 126 U/L 61  58  65   AST 15 - 41 U/L 18  18  18    ALT 0 - 44 U/L 22  20  22  ASSESSMENT & PLAN:  58 yo female   Leg edema  -New onset 1 week ago, no prior history of swelling, CHF, not on CCBs  -Edema is common SE with taxanes -She appears non acute, no hypoxia. Edema is mild but new -Given that she has breast cancer and recent chemo, will r/o DVT, and will rule out heart failure due to recent exposure to anthracycline chemo.  -My level of concern is not very high, but want to be thorough due to her upcoming breast surgery -If work up is negative, will trial short course of lasix .   Malignant neoplasm of overlapping sites of left female breast -05/23/2023: Left nipple punch biopsy: Infiltrating carcinoma consistent with breast origin (CK7 GATA3 and ER positive) ER 30%, PR 20%, HER2 1+ (send T4, N0, M0, stage IIIb) -06/02/2023: Breast MRI: 2.8 cm biopsy-proven malignancy left nipple retroareolar left breast, 5 cm enhancement UOQ left breast posterior superior to the biopsy-proven malignancy, highly suspicious left breast enhancement spanning 9.8 cm. -06/18/2023: Left breast biopsy 2:30 position: Grade 2 IDC with DCIS ER 75%, PR 75%, Ki67 20%, HER2 1+ negative -Biopsy on the 5 cm of enhancement: Grade 2 IDC, ER 75%, PR 75%, Ki 67: 20%, HER 2 1+ Neg -CT scans and bone scan: 06/18/23:Left breast mass 2.2 cm, prom axillary LN, no distant mets, non specific pancreatic head/ uncinate mass -07/02/2023: Oncotype DX: 30 (distant recurrence at 9 years: 19%) -S/p neoadjuvant chemotherapy with dose dense Adriamycin  and Cytoxan  x 4 followed by Taxol  x 12 started 07/21/2023 - 12/01/2023 followed by Dr. Odean -PENDING definitive surgery with Dr. Vanderbilt and Dr. Arelia,  planned 01/13/24 -Treatment plan includes adjuvant radiation and adjuvant antiestrogen therapy    PLAN: -STAT Doppler today is negative for DVT. Echo Wednesday; I will call with results -If negative will trial short course of lasix  (with potassium) -Breast cancer treatment as planned    Orders Placed This Encounter  Procedures   ECHOCARDIOGRAM COMPLETE    Standing Status:   Future    Expiration Date:   12/15/2024    Where should this test be performed:   [place]    Perflutren DEFINITY (image enhancing agent) should be administered unless hypersensitivity or allergy exist:   Administer Perflutren    Reason for exam-Echo:   Chemo  Z09    Other Comments:   recent anthracycline 3 months ago, new leg edema/cough      All questions were answered. The patient knows to call the clinic with any problems, questions or concerns. No barriers to learning were detected. I spent 20 minutes counseling the patient face to face. The total time spent in the appointment was 30 minutes and more than 50% was on counseling, review of test results, and coordination of care.   Mar Zettler K Iylah Dworkin, NP 12/16/2023

## 2023-12-15 NOTE — Telephone Encounter (Signed)
 Pt called and reports she started having BLE pitting edema Friday. She had Taxol  12/01/23 and is nor currently on diuretics.  She was offered [facility] visit with Lacie Burton, NP and she accepted. She will come to [facility] 7/29 at 0900 for further eval of edema.

## 2023-12-16 ENCOUNTER — Encounter: Payer: Self-pay | Admitting: Nurse Practitioner

## 2023-12-16 ENCOUNTER — Inpatient Hospital Stay (HOSPITAL_BASED_OUTPATIENT_CLINIC_OR_DEPARTMENT_OTHER): Admitting: Nurse Practitioner

## 2023-12-16 ENCOUNTER — Ambulatory Visit (HOSPITAL_BASED_OUTPATIENT_CLINIC_OR_DEPARTMENT_OTHER)
Admission: RE | Admit: 2023-12-16 | Discharge: 2023-12-16 | Disposition: A | Discharge status: Home / Self Care | Source: Ambulatory Visit | Attending: Nurse Practitioner | Admitting: Nurse Practitioner

## 2023-12-16 VITALS — BP 130/80 | HR 85 | Temp 98.2°F | Resp 17 | Wt 178.3 lb

## 2023-12-16 DIAGNOSIS — C50812 Malignant neoplasm of overlapping sites of left female breast: Secondary | ICD-10-CM

## 2023-12-16 DIAGNOSIS — R600 Localized edema: Secondary | ICD-10-CM

## 2023-12-16 DIAGNOSIS — Z170 Estrogen receptor positive status [ER+]: Secondary | ICD-10-CM | POA: Insufficient documentation

## 2023-12-16 MED ORDER — FUROSEMIDE 20 MG PO TABS: 20.0000 mg | ORAL_TABLET | Freq: Every day | ORAL | 0 refills | Status: DC

## 2023-12-16 MED ORDER — POTASSIUM CHLORIDE CRYS ER 20 MEQ PO TBCR: 20.0000 meq | EXTENDED_RELEASE_TABLET | Freq: Every day | ORAL | 0 refills | Status: DC

## 2023-12-17 ENCOUNTER — Ambulatory Visit (HOSPITAL_COMMUNITY)
Admission: RE | Admit: 2023-12-17 | Discharge: 2023-12-17 | Discharge status: Home / Self Care | Source: Ambulatory Visit | Attending: Nurse Practitioner

## 2023-12-17 DIAGNOSIS — R600 Localized edema: Secondary | ICD-10-CM | POA: Insufficient documentation

## 2023-12-17 DIAGNOSIS — C50812 Malignant neoplasm of overlapping sites of left female breast: Secondary | ICD-10-CM | POA: Diagnosis not present

## 2023-12-17 DIAGNOSIS — I358 Other nonrheumatic aortic valve disorders: Secondary | ICD-10-CM | POA: Diagnosis not present

## 2023-12-17 DIAGNOSIS — Z01818 Encounter for other preprocedural examination: Secondary | ICD-10-CM | POA: Diagnosis present

## 2023-12-17 DIAGNOSIS — I517 Cardiomegaly: Secondary | ICD-10-CM

## 2023-12-17 DIAGNOSIS — G4730 Sleep apnea, unspecified: Secondary | ICD-10-CM | POA: Insufficient documentation

## 2023-12-17 DIAGNOSIS — Z170 Estrogen receptor positive status [ER+]: Secondary | ICD-10-CM | POA: Diagnosis not present

## 2023-12-17 LAB — ECHOCARDIOGRAM COMPLETE: S' Lateral: 2.8 cm

## 2023-12-18 ENCOUNTER — Other Ambulatory Visit: Payer: Self-pay

## 2023-12-18 ENCOUNTER — Ambulatory Visit: Payer: Self-pay | Admitting: Nurse Practitioner

## 2023-12-18 ENCOUNTER — Telehealth: Payer: Self-pay

## 2023-12-18 NOTE — Telephone Encounter (Signed)
 Called patient to relay message below as per Lacie Burton NP she stated she has started the lasix  and potassium, she also stated she just heard from Lacie as well. Patient had no further questions at this time.   Kelsey Carlson, since the echo is over a week away, please call pt to let her go ahead and take lasix  (and potassium) 1 tab daily for 3-5 days for leg swelling since she is negative for DVT.   Lacie Burton NP

## 2023-12-25 ENCOUNTER — Other Ambulatory Visit: Payer: Self-pay

## 2023-12-25 NOTE — Progress Notes (Signed)
 Called patient as per Lacie Burton NP to check on her after reporting bilateral lower leg swelling. She was given a dieretic and a doppler done. She stated she is doing better, she is working more and is keeping her legs propped up on a stool under her desk. Patient has notice that when she eats food with a lot of salt that she does notice more swelling. But overall she is doing much better. Patient had no further questions at this time and appreciated the call to check up on her.

## 2023-12-30 ENCOUNTER — Other Ambulatory Visit: Payer: Self-pay

## 2023-12-30 ENCOUNTER — Encounter (HOSPITAL_BASED_OUTPATIENT_CLINIC_OR_DEPARTMENT_OTHER): Payer: Self-pay | Admitting: Surgery

## 2024-01-02 ENCOUNTER — Ambulatory Visit
Admission: RE | Admit: 2024-01-02 | Discharge: 2024-01-02 | Disposition: A | Discharge status: Home / Self Care | Source: Ambulatory Visit | Attending: Surgery | Admitting: Surgery

## 2024-01-02 DIAGNOSIS — C50912 Malignant neoplasm of unspecified site of left female breast: Secondary | ICD-10-CM

## 2024-01-02 HISTORY — PX: BREAST BIOPSY: SHX20

## 2024-01-05 ENCOUNTER — Ambulatory Visit (INDEPENDENT_AMBULATORY_CARE_PROVIDER_SITE_OTHER): Payer: Managed Care, Other (non HMO) | Admitting: Dermatology

## 2024-01-05 DIAGNOSIS — L729 Follicular cyst of the skin and subcutaneous tissue, unspecified: Secondary | ICD-10-CM

## 2024-01-05 DIAGNOSIS — L578 Other skin changes due to chronic exposure to nonionizing radiation: Secondary | ICD-10-CM | POA: Diagnosis not present

## 2024-01-05 DIAGNOSIS — L821 Other seborrheic keratosis: Secondary | ICD-10-CM

## 2024-01-05 DIAGNOSIS — W908XXA Exposure to other nonionizing radiation, initial encounter: Secondary | ICD-10-CM

## 2024-01-05 DIAGNOSIS — D225 Melanocytic nevi of trunk: Secondary | ICD-10-CM

## 2024-01-05 DIAGNOSIS — D2239 Melanocytic nevi of other parts of face: Secondary | ICD-10-CM

## 2024-01-05 DIAGNOSIS — L720 Epidermal cyst: Secondary | ICD-10-CM | POA: Diagnosis not present

## 2024-01-05 DIAGNOSIS — Z1283 Encounter for screening for malignant neoplasm of skin: Secondary | ICD-10-CM | POA: Diagnosis not present

## 2024-01-05 DIAGNOSIS — D229 Melanocytic nevi, unspecified: Secondary | ICD-10-CM

## 2024-01-05 DIAGNOSIS — L719 Rosacea, unspecified: Secondary | ICD-10-CM | POA: Diagnosis not present

## 2024-01-05 DIAGNOSIS — C50912 Malignant neoplasm of unspecified site of left female breast: Secondary | ICD-10-CM

## 2024-01-05 DIAGNOSIS — L814 Other melanin hyperpigmentation: Secondary | ICD-10-CM

## 2024-01-05 DIAGNOSIS — D2330 Other benign neoplasm of skin of unspecified part of face: Secondary | ICD-10-CM

## 2024-01-05 DIAGNOSIS — D1801 Hemangioma of skin and subcutaneous tissue: Secondary | ICD-10-CM

## 2024-01-05 DIAGNOSIS — L603 Nail dystrophy: Secondary | ICD-10-CM

## 2024-01-05 DIAGNOSIS — L601 Onycholysis: Secondary | ICD-10-CM

## 2024-01-05 MED ORDER — METRONIDAZOLE 0.75 % EX CREA: TOPICAL_CREAM | CUTANEOUS | 11 refills | Status: AC

## 2024-01-05 NOTE — H&P (Signed)
 History of Present Illness: Kelsey Carlson is a 58 y.o. female who is seen today for post chemoradiotherapy check after neoadjuvant chemotherapy for stage II left breast cancer. She has had significant reduction in size. She still has a 4 mm focus of disease at the nipple areolar complex. Lymph nodes her unremarkable. Area of enhancement has reduced in size significantly. She is wanting to pursue breast reduction with lumpectomy and is seeing plastic surgery. She is tired but otherwise is tolerating chemotherapy fairly well..    Review of Systems: A complete review of systems was obtained from the patient. I have reviewed this information and discussed as appropriate with the patient. See HPI as well for other ROS.    Medical History: History reviewed. No pertinent past medical history.  There is no problem list on file for this patient.  Past Surgical History:  Procedure Laterality Date  CHOLECYSTECTOMY  MASTECTOMY PARTIAL / LUMPECTOMY    No Known Allergies  No current outpatient medications on file prior to visit.   No current facility-administered medications on file prior to visit.   Family History  Problem Relation Age of Onset  Skin cancer Mother  Breast cancer Mother  Skin cancer Father    Social History   Tobacco Use  Smoking Status Never  Smokeless Tobacco Never    Social History   Socioeconomic History  Marital status: Married  Tobacco Use  Smoking status: Never  Smokeless tobacco: Never  Substance and Sexual Activity  Alcohol use: Defer  Drug use: Defer   Social Drivers of Health   Food Insecurity: No Food Insecurity (06/11/2023)  Received from [facility]  Hunger Vital Sign  Within the past 12 months, you worried that your food would run out before you got the money to buy more.: Never true  Within the past 12 months, the food you bought just didn't last and you didn't have money to get more.: Never true  Transportation Needs: No Transportation  Needs (06/11/2023)  Received from [facility] - Transportation  Lack of Transportation (Medical): No  Lack of Transportation (Non-Medical): No  Housing Stability: Unknown (05/23/2023)  Housing Stability Vital Sign  Homeless in the Last Year: No   Objective:   Vitals:  12/05/23 0906  PainSc: 0-No pain  PainLoc: Breast   There is no height or weight on file to calculate BMI.  Physical Exam Exam conducted with a chaperone present (brooks).  Chest:  Breasts: Right: Normal. No mass or tenderness.  Left: Bleeding present. No mass or tenderness.  Comments: Small scab left nipple. Left breast smaller than right  Skin: General: Skin is warm.   Neurological:  General: No focal deficit present.  Mental Status: She is alert.     Labs, Imaging and Diagnostic Testing:  This result has an attachment that is not available. CLINICAL DATA: Evaluation for treatment response. Patient has history of left breast cancer diagnosed in January 2025 status post neoadjuvant treatment. Family history of breast cancer in the patient's mother.  EXAM: BILATERAL BREAST MRI WITH AND WITHOUT CONTRAST  TECHNIQUE: Multiplanar, multisequence MR images of both breasts were obtained prior to and following the intravenous administration of 7.5 ml of Vueway   Three-dimensional MR images were rendered by post-processing of the original MR data on an independent workstation. The three-dimensional MR images were interpreted, and findings are reported in the following complete MRI report for this study. Three dimensional images were evaluated at the independent interpreting workstation using the DynaCAD thin client.  COMPARISON:  Previous exam(s).  FINDINGS: Breast composition: b. Scattered fibroglandular tissue.  Background parenchymal enhancement: Minimal  Right breast: No mass or abnormal enhancement.  Left breast: Significantly decreased irregular masslike enhancement involving the left  nipple and retroareolar region. There is still a small amount of enhancement within the nipple and a 0.4 cm focus of enhancement in the lower outer retroareolar aspect (series 10, image 90).  Interval significant debulking of patchy non mass enhancement in the upper outer left breast measuring 3.5 x 2.0 x 5.1 cm, previously measuring 3.5 x 3.0 x 5.0 cm. No new suspicious findings in the left breast.  Lymph nodes: No abnormal appearing lymph nodes.  Ancillary findings: None.  IMPRESSION: 1. Significant decrease in malignant irregular masslike enhancement involving the left nipple in retroareolar region. There is still a small amount of abnormal enhancement within the nipple and a 0.4 cm enhancing focus in the retroareolar aspect.  2. Interval debulking of biopsy-proven malignant non mass enhancement in the upper outer left breast though overall measurements are similar to prior.  RECOMMENDATION: Continue treatment plan for known left breast cancer.  BI-RADS CATEGORY 6: Known biopsy-proven malignancy.  Electronically Signed By: Inocente Ast M.D. On: 12/04/2023 11:03  CLINICAL DATA: 58 year old female with recent LEFT nipple punch biopsy demonstrating infiltrating carcinoma, consistent with breast origin.  EXAM: BILATERAL BREAST MRI WITH AND WITHOUT CONTRAST  TECHNIQUE: Multiplanar, multisequence MR images of both breasts were obtained prior to and following the intravenous administration of 8 ml of Vueway   Three-dimensional MR images were rendered by post-processing of the original MR data on an independent workstation. The three-dimensional MR images were interpreted, and findings are reported in the following complete MRI report for this study. Three dimensional images were evaluated at the independent interpreting workstation using the DynaCAD thin client.  COMPARISON: Prior mammograms  FINDINGS: Breast composition: b. Scattered fibroglandular  tissue.  Background parenchymal enhancement: Mild  Right breast: No suspicious mass or worrisome enhancement.  Left breast: Irregular masslike enhancement involving the LEFT nipple and RETROAREOLAR region measures 2.2 x 3 x 2.8 cm (AP x transverse x CC) and compatible with biopsy-proven malignancy (images 93-109: Series 12).  2.5 cm posterosuperior to the biopsy-proven LEFT nipple/RETROAREOLAR malignancy above, lies a 3.5 x 3 x 5 cm patchy and confluent irregular mass and non masslike and enhancement within the middle depth UPPER-OUTER LEFT breast likely representing additional LEFT breast malignancy (44-81:12).  The LEFT nipple/RETROAREOLAR malignancy and highly suspicious UPPER-OUTER LEFT breast enhancement span a distance of 9.8 cm.  No other suspicious abnormalities within the LEFT breast identified.  Lymph nodes: No abnormal appearing lymph nodes.  Ancillary findings: None.  IMPRESSION: 1. 2.8 cm biopsy-proven malignancy involving the LEFT nipple/RETROAREOLAR LEFT breast. 2. 3.5 x 3 x 5 cm highly suspicious irregular enhancement within the UPPER-OUTER LEFT breast, located 2.5 cm posterosuperior to the biopsy-proven LEFT nipple/RETROAREOLAR malignancy. Tissue sampling recommended if breast conservation is considered. 3. The LEFT nipple/RETROAREOLAR malignancy and highly suspicious UPPER-OUTER LEFT breast enhancement span a distance of 9.8 cm. 4. No evidence of RIGHT breast malignancy or abnormal appearing lymph nodes.  RECOMMENDATION: MR guided biopsy of highly suspicious UPPER-OUTER LEFT breast enhancement if breast conservation is desired.  BI-RADS CATEGORY 5: Highly suggestive of malignancy.  Electronically Signed By: Reyes Phi M.D. On: 06/02/2023 17:35 Exam End: 06/02/23 14:54 Last Resulted: 06/02/23 17:35  Received From: [facility] Received: 12/05/23 09:03  View Encounter   Exam End: 12/02/23 16:53 Last Resulted: 12/04/23 11:03  Received From:  [facility]  Received: 12/05/23 09:03   INAL DIAGNOSIS  1. Breast, left, needle core biopsy, 2:30 o'clock, barbell clip : - INVASIVE MAMMARY CARCINOMA, AN IMMUNOHISTOCHEMICAL STAIN FOR E-CADHERIN IS PENDING AND WILL BE REPORTED IN ADDENDUM. 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 PICKLESIMER REVIEWED THE CASE AND CONCURS WITH THE INTERPRETATION. A BREAST PROGNOSTIC PROFILE (ER, PR, KI-67 AND HER2) IS PENDING AND WILL BE REPORTED IN AN ADDENDUM. THE [facility] [place] WAS NOTIFIED ON 06/19/2023.  DATE SIGNED OUT: 06/19/2023 ELECTRONIC SIGNATURE : Legolvan Do, Mark, Pathologist, Electronic Signature  MICROSCOPIC DESCRIPTION  CASE COMMENTS STAINS USED IN DIAGNOSIS: H&E-2 H&E-3 H&E-4 H&E H&E-2 H&E-3 H&E-4 H&E *RECUT 1 SLIDE Universal Negative Control-DAB Universal Negative Control-DAB Universal Negative Control-DAB Universal Negative Control-DAB E-CAD Stains used in diagnosis 1 Her2 by IHC, 1 ER-ACIS, 1 KI-67-ACIS, 1 PR-ACIS IHC scores are reported using ASCO/CAP scoring criteria. An IHC Score of 0 or 1+ is NEGATIVE for HER2, 3+ is POSITIVE for HER2, and 2+ is EQUIVOCAL. Equivocal results are reflexed to either FISH or IHC testing. Specimens are fixed in 10% Neutral Buffered Formalin for at least 6 hours and up to 72 hours. These tests have not be validated on decalcified tissue. Results should be interpreted with caution given the possibility of false negative results on decalcified specimens. Antibody Clone for HER2 is 4B5 (PATHWAY). Some of these immunohistochemical stains may have been developed and the  performance characteristics determined by [facility]. Some may not have been cleared or approved by the U.S. Food and Drug Administration. The FDA has determined that such clearance or approval is not necessary. This test is used for clinical purposes. It should not be regarded as investigational or for research. This laboratory is certified under the Clinical Laboratory Improvement Amendments of 1988 (CLIA-88) as qualified to perform high complexity clinical laboratory testing. Estrogen receptor (6F11), immunohistochemical stains are performed on formalin fixed, paraffin embedded tissue using a 3,3-diaminobenzidine (DAB) chromogen and Leica Bond Autostainer System. The staining intensity of the nucleus is scored manually and is reported as the percentage of tumor cell nuclei demonstrating specific nuclear staining.Specimens are fixed in 10% Neutral Buffered Formalin for at least 6 hours and up to 72 hours. These tests have not be validated on decalcified tissue. Results should be interpreted with caution given the possibility of false negative results on decalcified specimens. Ki-67 (MM1), immunohistochemical stains are performed on formalin fixed, paraffin embedded tissue using a 3,3-diaminobenzidine (DAB) chromogen and Leica Bond Autostainer System. The staining intensity of the nucleus is scored manually and is reported as the percentage of tumor cell nuclei demonstrating specific nuclear staining.Specimens are fixed in 10% Neutral Buffered Formalin for at least 6 hours and up to 72 hours. These tests have not be validated on decalcified tissue. Results should be interpreted with caution given the possibility of false negative results on decalcified specimens. PR progesterone receptor (16), immunohistochemical stains are performed on formalin fixed, paraffin embedded tissue using a 3,3-diaminobenzidine (DAB) chromogen and Leica Bond Autostainer System. The staining  intensity of the nucleus is scored manually and is reported as the percentage of tumor cell nuclei demonstrating specific nuclear staining.Specimens are fixed in 10% Neutral Buffered Formalin for at  least 6 hours and up to 72 hours. These tests have not be validated on decalcified tissue. Results should be interpreted with caution given the possibility of false negative results on decalcified specimens.  ADDENDUM An e-cadherin immunohistochemical stain shows retained membranous staining consistent with an invasive ductal carcinoma. Legolvan Do, Mark, Pathologist, Electronic Signature ( Signed 01 30 2025) 1A) Breast, left, needle core biopsy, 2:30 o'clock, barbell clip PROGNOSTIC INDICATORS  Results: IMMUNOHISTOCHEMICAL AND MORPHOMETRIC ANALYSIS PERFORMED MANUALLY The tumor cells are NEGATIVE for Her2 (1+). Estrogen Receptor: 75%, POSITIVE, MODERATE-STRONG STAINING INTENSITY Progesterone Receptor: 75%, POSITIVE, MODERATE-STRONG STAINING INTENSITY Proliferation Marker Ki67: 20% REFERENCE RANGE ESTROGEN RECEPTOR NEGATIVE 0% POSITIVE =>1% REFERENCE RANGE PROGESTERONE RECEPTOR NEGATIVE 0% POSITIVE =>1% All controls stained appropriately [place] Md, Fred , Sports administrator, International aid/development worker ( Signed 01 31 2025)  CLINICAL HISTORY  SPECIMEN(S) OBTAINED 1. Breast, left, needle core biopsy, 2:30 O'clock, Barbell Clip  SPECIMEN COMMENTS: 1. TIF: 7:45 AM, CIT 3 minutes; recent diagnosis of left breast carcinoma on skin punch biopsy; 5cm area of focal abnormal left breast enhancing on MRI SPECIMEN CLINICAL INFORMATION: 1. Suspect carcinoma  Gross Description 1. In formalin labeled Leifheit, Dilcia and left 2:30 o'clock (TIF 0745 and CIT 3 minutes) are multiple cores and irregular pieces of pink white to yellow soft to rubbery tissue, 2.6 x 2 x 0.4 cm in aggregate. Entirely submitted in two blocks. (SSW:kh 06/18/23)  Report signed out from the following location(s) [place].  [facility] [address] [place], [place] [address] CLIA #: [id number]  [facility] [address] [place], [place] [address] CLIA #: [id number]  Resulting Agency [facility]   Assessment and Plan:   Diagnoses and all orders for this visit:  Malignant neoplasm of left female breast, unspecified estrogen receptor status, unspecified site of breast (CMS/HHS-HCC)    The patient is chosen for breast conserving surgery but is going to undergo breast reduction. Recommend left breast seed localized lumpectomy using a bracketed approach, a left nipple biopsy to see if there is any residual disease left at the nipple with left axillary sentinel lymph node mapping. I explained if her nipple is positive, this may need to be excised and she is aware of that. I think an attempt to preserve it is warranted given her response on MRI and she is willing to undergo additional procedures anyway where it can be excised if the margin is positive with her reduction. Discussed risk benefits of breast conserving surgery versus mastectomy and reconstruction. Long-term survival, local regional recurrence and quality of life issues all reviewed. Risk of lymphedema reviewed as well.The procedure has been discussed with the patient. Alternatives to surgery have been discussed with the patient. Risks of surgery include bleeding, Infection, Seroma formation, death, and the need for further surgery. The patient understands and wishes to proceed.   No follow-ups on file.  DEBBY CURTISTINE SHIPPER, MD

## 2024-01-05 NOTE — Progress Notes (Signed)
 New Patient Visit   Subjective  Kelsey Carlson is a 58 y.o. female who presents for the following: Skin Cancer Screening and Full Body Skin Exam  The patient presents for Total-Body Skin Exam (TBSE) for skin cancer screening and mole check. The patient has spots, moles and lesions to be evaluated, some may be new or changing. She has a spot on her chest that is dark and raised. Patient with history of Paget's of the breast, upcoming reconstructive surgery, port being removed tomorrow. Last chemo July 14.     The following portions of the chart were reviewed this encounter and updated as appropriate: medications, allergies, medical history  Review of Systems:  No other skin or systemic complaints except as noted in HPI or Assessment and Plan.  Objective  Well appearing patient in no apparent distress; mood and affect are within normal limits.  A full examination was performed including scalp, head, eyes, ears, nose, lips, neck, chest, axillae, abdomen, back, buttocks, bilateral upper extremities, bilateral lower extremities, hands, feet, fingers, toes, fingernails, and toenails. All findings within normal limits unless otherwise noted below.   Relevant physical exam findings are noted in the Assessment and Plan.    Assessment & Plan   SKIN CANCER SCREENING PERFORMED TODAY.  ACTINIC DAMAGE - Chronic condition, secondary to cumulative UV/sun exposure - diffuse scaly erythematous macules with underlying dyspigmentation - Recommend daily broad spectrum sunscreen SPF 30+ to sun-exposed areas, reapply every 2 hours as needed.  - Staying in the shade or wearing long sleeves, sun glasses (UVA+UVB protection) and wide brim hats (4-inch brim around the entire circumference of the hat) are also recommended for sun protection.  - Call for new or changing lesions.  LENTIGINES, SEBORRHEIC KERATOSES, HEMANGIOMAS - Benign normal skin lesions - Benign-appearing - Call for any  changes  MELANOCYTIC NEVI - Tan-brown and/or pink-flesh-colored symmetric macules and papules, including left posterior shoulder with tan papule. Flesh papule at inferior chin.  - 3 mm speckled brown macule at the right mid back - Benign appearing on exam today - Observation - Call clinic for new or changing moles - Recommend daily use of broad spectrum spf 30+ sunscreen to sun-exposed areas.   EPIDERMAL INCLUSION CYST Exam: Subcutaneous nodule at left lower back with assoc scar  Benign-appearing. Exam most consistent with an epidermal inclusion cyst. Discussed that a cyst is a benign growth that can grow over time and sometimes get irritated or inflamed. Recommend observation if it is not bothersome. Discussed option of surgical excision to remove it if it is growing, symptomatic, or other changes noted. Please call for new or changing lesions so they can be evaluated.  NAIL DYSTROPHY Exam: Distal onycholysis fingernails and toenails secondary to chemotherapy.   Treatment Plan: Benign-appearing.  Improving off chemo. Observation.    ROSACEA Exam Mid face erythema with telangiectasias and few scattered pink papules.   Chronic and persistent condition with duration or expected duration over one year. Condition is bothersome/symptomatic for patient. Currently flared.   Rosacea is a chronic progressive skin condition usually affecting the face of adults, causing redness and/or acne bumps. It is treatable but not curable. It sometimes affects the eyes (ocular rosacea) as well. It may respond to topical and/or systemic medication and can flare with stress, sun exposure, alcohol, exercise, topical steroids (including hydrocortisone /cortisone 10) and some foods.  Daily application of broad spectrum spf 30+ sunscreen to face is recommended to reduce flares.  Treatment Plan Start metronidazole  0.75% cream to aa  face 1-2 times daily dsp 45g 1 yr rf.  Recommend daily broad spectrum sunscreen  SPF 30+ to sun-exposed areas, reapply every 2 hours as needed. Call for new or changing lesions.  Staying in the shade or wearing long sleeves, sun glasses (UVA+UVB protection) and wide brim hats (4-inch brim around the entire circumference of the hat) are also recommended for sun protection.   Fibrous Papule - 2.5 mm firm flesh papule at nasal root - Benign-appearing.  Observation.  Call clinic for new or changing lesions.   PAGET'S DISEASE OF THE L BREAST Pt has completed chemo, and has surgery tomorrow to complete treatment   Return in about 1 year (around 01/04/2025) for TBSE.  IAndrea Kerns, CMA, am acting as scribe for Rexene Rattler, MD .   Documentation: I have reviewed the above documentation for accuracy and completeness, and I agree with the above.  Rexene Rattler, MD

## 2024-01-05 NOTE — Patient Instructions (Addendum)
Seborrheic Keratosis  What causes seborrheic keratoses? Seborrheic keratoses are harmless, common skin growths that first appear during adult life.  As time goes by, more growths appear.  Some people may develop a large number of them.  Seborrheic keratoses appear on both covered and uncovered body parts.  They are not caused by sunlight.  The tendency to develop seborrheic keratoses can be inherited.  They vary in color from skin-colored to gray, brown, or even black.  They can be either smooth or have a rough, warty surface.   Seborrheic keratoses are superficial and look as if they were stuck on the skin.  Under the microscope this type of keratosis looks like layers upon layers of skin.  That is why at times the top layer may seem to fall off, but the rest of the growth remains and re-grows.    Treatment Seborrheic keratoses do not need to be treated, but can easily be removed in the office.  Seborrheic keratoses often cause symptoms when they rub on clothing or jewelry.  Lesions can be in the way of shaving.  If they become inflamed, they can cause itching, soreness, or burning.  Removal of a seborrheic keratosis can be accomplished by freezing, burning, or surgery. If any spot bleeds, scabs, or grows rapidly, please return to have it checked, as these can be an indication of a skin cancer.   Rosacea  What is rosacea? Rosacea (say: ro-zay-sha) is a common skin disease that usually begins as a trend of flushing or blushing easily.  As rosacea progresses, a persistent redness in the center of the face will develop and may gradually spread beyond the nose and cheeks to the forehead and chin.  In some cases, the ears, chest, and back could be affected.  Rosacea may appear as tiny blood vessels or small red bumps that occur in crops.  Frequently they can contain pus, and are called "pustules".  If the bumps do not contain pus, they are referred to as "papules".  Rarely, in prolonged, untreated cases of  rosacea, the oil glands of the nose and cheeks may become permanently enlarged.  This is called rhinophyma, and is seen more frequently in men.  Signs and Risks In its beginning stages, rosacea tends to come and go, which makes it difficult to recognize.  It can start as intermittent flushing of the face.  Eventually, blood vessels may become permanently visible.  Pustules and papules can appear, but can be mistaken for adult acne.  People of all races, ages, genders and ethnic groups are at risk of developing rosacea.  However, it is more common in women (especially around menopause) and adults with fair skin between the ages of 54 and 83.  Treatment Dermatologists typically recommend a combination of treatments to effectively manage rosacea.  Treatment can improve symptoms and may stop the progression of the rosacea.  Treatment may involve both topical and oral medications.  The tetracycline antibiotics are often used for their anti-inflammatory effect; however, because of the possibility of developing antibiotic resistance, they should not be used long term at full dose.  For dilated blood vessels the options include electrodessication (uses electric current through a small needle), laser treatment, and cosmetics to hide the redness.   With all forms of treatment, improvement is a slow process, and patients may not see any results for the first 3-4 weeks.  It is very important to avoid the sun and other triggers.  Patients must wear sunscreen daily.  Skin  Care Instructions: Cleanse the skin with a mild soap such as CeraVe cleanser, Cetaphil cleanser, or Dove soap once or twice daily as needed. Moisturize with Eucerin Redness Relief Daily Perfecting Lotion (has a subtle green tint), CeraVe Moisturizing Cream, or Oil of Olay Daily Moisturizer with sunscreen every morning and/or night as recommended. Makeup should be "non-comedogenic" (won't clog pores) and be labeled "for sensitive skin". Good choices  for cosmetics are: Neutrogena, Almay, and Physician's Formula.  Any product with a green tint tends to offset a red complexion. If your eyes are dry and irritated, use artificial tears 2-3 times per day and cleanse the eyelids daily with baby shampoo.  Have your eyes examined at least every 2 years.  Be sure to tell your eye doctor that you have rosacea. Alcoholic beverages tend to cause flushing of the skin, and may make rosacea worse. Always wear sunscreen, protect your skin from extreme hot and cold temperatures, and avoid spicy foods, hot drinks, and mechanical irritation such as rubbing, scrubbing, or massaging the face.  Avoid harsh skin cleansers, cleansing masks, astringents, and exfoliation. If a particular product burns or makes your face feel tight, then it is likely to flare your rosacea. If you are having difficulty finding a sunscreen that you can tolerate, you may try switching to a chemical-free sunscreen.  These are ones whose active ingredient is zinc oxide or titanium dioxide only.  They should also be fragrance free, non-comedogenic, and labeled for sensitive skin. Rosacea triggers may vary from person to person.  There are a variety of foods that have been reported to trigger rosacea.  Some patients find that keeping a diary of what they were doing when they flared helps them avoid triggers.   Due to recent changes in healthcare laws, you may see results of your pathology and/or laboratory studies on MyChart before the doctors have had a chance to review them. We understand that in some cases there may be results that are confusing or concerning to you. Please understand that not all results are received at the same time and often the doctors may need to interpret multiple results in order to provide you with the best plan of care or course of treatment. Therefore, we ask that you please give [place] 2 business days to thoroughly review all your results before contacting the office for  clarification. Should we see a critical lab result, you will be contacted sooner.   If You Need Anything After Your Visit  If you have any questions or concerns for your doctor, please call our main line at [phone] and press option 4 to reach your doctor's medical assistant. If no one answers, please leave a voicemail as directed and we will return your call as soon as possible. Messages left after 4 pm will be answered the following business day.   You may also send [place] a message via MyChart. We typically respond to MyChart messages within 1-2 business days.  For prescription refills, please ask your pharmacy to contact our office. Our fax number is [phone].  If you have an urgent issue when the clinic is closed that cannot wait until the next business day, you can page your doctor at the number below.    Please note that while we do our best to be available for urgent issues outside of office hours, we are not available 24/7.   If you have an urgent issue and are unable to reach [place], you may choose to seek medical care  at your doctor's office, retail clinic, urgent care center, or emergency room.  If you have a medical emergency, please immediately call 911 or go to the emergency department.  Pager Numbers  - Dr. Gwen Pounds: [phone]  - Dr. Roseanne Reno: [phone]  - Dr. Katrinka Blazing: [phone]   In the event of inclement weather, please call our main line at [phone] for an update on the status of any delays or closures.  Dermatology Medication Tips: Please keep the boxes that topical medications come in in order to help keep track of the instructions about where and how to use these. Pharmacies typically print the medication instructions only on the boxes and not directly on the medication tubes.   If your medication is too expensive, please contact our office at [phone] option 4 or send [place] a message through MyChart.   We are unable to tell what your co-pay for  medications will be in advance as this is different depending on your insurance coverage. However, we may be able to find a substitute medication at lower cost or fill out paperwork to get insurance to cover a needed medication.   If a prior authorization is required to get your medication covered by your insurance company, please allow [place] 1-2 business days to complete this process.  Drug prices often vary depending on where the prescription is filled and some pharmacies may offer cheaper prices.  The website www.goodrx.com contains coupons for medications through different pharmacies. The prices here do not account for what the cost may be with help from insurance (it may be cheaper with your insurance), but the website can give you the price if you did not use any insurance.  - You can print the associated coupon and take it with your prescription to the pharmacy.  - You may also stop by our office during regular business hours and pick up a GoodRx coupon card.  - If you need your prescription sent electronically to a different pharmacy, notify our office through [facility] or by phone at [phone] option 4.     Si Usted Necesita Algo Despus de Su Visita  Tambin puede enviarnos un mensaje a travs de Clinical cytogeneticist. Por lo general respondemos a los mensajes de MyChart en el transcurso de 1 a 2 das hbiles.  Para renovar recetas, por favor pida a su farmacia que se ponga en contacto con nuestra oficina. Annie Sable de fax es [place] [phone].  Si tiene un asunto urgente cuando la clnica est cerrada y que no puede esperar hasta el siguiente da hbil, puede llamar/localizar a su doctor(a) al nmero que aparece a continuacin.   Por favor, tenga en cuenta que aunque hacemos todo lo posible para estar disponibles para asuntos urgentes fuera del horario de [place], no estamos disponibles las 24 horas del da, los 7 [address]  [address] de la [place].   Si tiene un problema urgente y no puede  comunicarse con nosotros, puede optar por buscar atencin mdica  en el consultorio de su doctor(a), en una clnica privada, en un centro de atencin urgente o en una sala de emergencias.  Si tiene Engineer, drilling, por favor llame inmediatamente al 911 o vaya a la sala de emergencias.  Nmeros de bper  - Dr. Gwen Pounds: [phone]  - Dra. Roseanne Reno: [phone]  - Dr. Katrinka Blazing: [phone]   En caso de inclemencias del tiempo, por favor llame a Lacy Duverney principal al [phone] para una actualizacin sobre el [place] de cualquier retraso o cierre.  Consejos para la  medicacin en dermatologa: Por favor, guarde las cajas en las que vienen los medicamentos de uso tpico para ayudarle a seguir las instrucciones sobre dnde y cmo usarlos. Las farmacias generalmente imprimen las instrucciones del medicamento slo en las cajas y no directamente en los tubos del [place].   Si su medicamento es muy caro, por favor, pngase en contacto con Rolm Gala llamando al [phone] y presione la opcin 4 o envenos un mensaje a travs de Clinical cytogeneticist.   No podemos decirle cul ser su copago por los medicamentos por adelantado ya que esto es diferente dependiendo de la cobertura de su seguro. Sin embargo, es posible que podamos encontrar un medicamento sustituto a Audiological scientist un formulario para que el seguro cubra el medicamento que se considera necesario.   Si se requiere una autorizacin previa para que su compaa de seguros [place] su medicamento, por favor permtanos de 1 a 2 das hbiles para completar [address]  Los precios de los medicamentos varan con frecuencia dependiendo del Environmental consultant de dnde se surte la receta y alguna farmacias pueden ofrecer precios ms baratos.  El sitio web www.goodrx.com tiene cupones para medicamentos de Health and safety inspector. Los precios aqu no tienen en cuenta lo que podra costar con la ayuda del seguro (puede ser ms barato con su seguro), pero  el sitio web puede darle el precio si no utiliz Tourist information centre manager.  - Puede imprimir el cupn correspondiente y llevarlo con su receta a la farmacia.  - Tambin puede pasar por nuestra oficina durante el horario de atencin regular y Education officer, museum una tarjeta de cupones de GoodRx.  - Si necesita que su receta se enve electrnicamente a una farmacia diferente, informe a nuestra oficina a travs de MyChart de [place] o por telfono llamando al [phone] y presione la opcin 4.

## 2024-01-06 ENCOUNTER — Ambulatory Visit (HOSPITAL_BASED_OUTPATIENT_CLINIC_OR_DEPARTMENT_OTHER): Admitting: Anesthesiology

## 2024-01-06 ENCOUNTER — Ambulatory Visit
Admission: RE | Admit: 2024-01-06 | Discharge: 2024-01-06 | Disposition: A | Discharge status: Home / Self Care | Source: Ambulatory Visit | Attending: Surgery | Admitting: Surgery

## 2024-01-06 ENCOUNTER — Other Ambulatory Visit: Payer: Self-pay

## 2024-01-06 ENCOUNTER — Encounter (HOSPITAL_BASED_OUTPATIENT_CLINIC_OR_DEPARTMENT_OTHER)
Admission: RE | Disposition: A | Discharge status: Home / Self Care | Payer: Self-pay | Source: Home / Self Care | Attending: Surgery

## 2024-01-06 ENCOUNTER — Encounter (HOSPITAL_BASED_OUTPATIENT_CLINIC_OR_DEPARTMENT_OTHER): Payer: Self-pay | Admitting: Surgery

## 2024-01-06 ENCOUNTER — Ambulatory Visit (HOSPITAL_BASED_OUTPATIENT_CLINIC_OR_DEPARTMENT_OTHER)
Admission: RE | Admit: 2024-01-06 | Discharge: 2024-01-06 | Disposition: A | Discharge status: Home / Self Care | Attending: Surgery | Admitting: Surgery

## 2024-01-06 DIAGNOSIS — C50412 Malignant neoplasm of upper-outer quadrant of left female breast: Secondary | ICD-10-CM | POA: Diagnosis present

## 2024-01-06 DIAGNOSIS — C50912 Malignant neoplasm of unspecified site of left female breast: Secondary | ICD-10-CM | POA: Diagnosis not present

## 2024-01-06 DIAGNOSIS — Z170 Estrogen receptor positive status [ER+]: Secondary | ICD-10-CM | POA: Diagnosis not present

## 2024-01-06 DIAGNOSIS — Z1722 Progesterone receptor negative status: Secondary | ICD-10-CM | POA: Insufficient documentation

## 2024-01-06 DIAGNOSIS — K219 Gastro-esophageal reflux disease without esophagitis: Secondary | ICD-10-CM | POA: Insufficient documentation

## 2024-01-06 DIAGNOSIS — M1990 Unspecified osteoarthritis, unspecified site: Secondary | ICD-10-CM | POA: Insufficient documentation

## 2024-01-06 DIAGNOSIS — E66811 Obesity, class 1: Secondary | ICD-10-CM | POA: Diagnosis not present

## 2024-01-06 DIAGNOSIS — Z9221 Personal history of antineoplastic chemotherapy: Secondary | ICD-10-CM | POA: Diagnosis not present

## 2024-01-06 DIAGNOSIS — F32A Depression, unspecified: Secondary | ICD-10-CM | POA: Insufficient documentation

## 2024-01-06 DIAGNOSIS — G4730 Sleep apnea, unspecified: Secondary | ICD-10-CM | POA: Diagnosis not present

## 2024-01-06 DIAGNOSIS — Z1721 Progesterone receptor positive status: Secondary | ICD-10-CM | POA: Insufficient documentation

## 2024-01-06 DIAGNOSIS — Z6834 Body mass index (BMI) 34.0-34.9, adult: Secondary | ICD-10-CM

## 2024-01-06 DIAGNOSIS — Z1732 Human epidermal growth factor receptor 2 negative status: Secondary | ICD-10-CM | POA: Insufficient documentation

## 2024-01-06 DIAGNOSIS — Z01818 Encounter for other preprocedural examination: Secondary | ICD-10-CM

## 2024-01-06 DIAGNOSIS — Z803 Family history of malignant neoplasm of breast: Secondary | ICD-10-CM | POA: Insufficient documentation

## 2024-01-06 DIAGNOSIS — Z87891 Personal history of nicotine dependence: Secondary | ICD-10-CM | POA: Diagnosis not present

## 2024-01-06 HISTORY — DX: Gastro-esophageal reflux disease without esophagitis: K21.9

## 2024-01-06 HISTORY — PX: EXCISION OF BREAST BIOPSY: SHX5822

## 2024-01-06 HISTORY — PX: BREAST LUMPECTOMY WITH RADIOACTIVE SEED AND SENTINEL LYMPH NODE BIOPSY: SHX6550

## 2024-01-06 HISTORY — DX: Sleep apnea, unspecified: G47.30

## 2024-01-06 SURGERY — BREAST LUMPECTOMY WITH RADIOACTIVE SEED AND SENTINEL LYMPH NODE BIOPSY
Anesthesia: Regional | Site: Breast | Laterality: Left

## 2024-01-06 MED ORDER — IBUPROFEN 800 MG PO TABS: 800.0000 mg | ORAL_TABLET | Freq: Three times a day (TID) | ORAL | 0 refills | Status: AC | PRN

## 2024-01-06 MED ORDER — DEXAMETHASONE SODIUM PHOSPHATE 10 MG/ML IJ SOLN
INTRAMUSCULAR | Status: DC | PRN
  Administered 2024-01-06: 10 mg via INTRAVENOUS

## 2024-01-06 MED ORDER — BUPIVACAINE-EPINEPHRINE (PF) 0.25% -1:200000 IJ SOLN
INTRAMUSCULAR | Status: DC | PRN
  Administered 2024-01-06: 20 mL via PERINEURAL

## 2024-01-06 MED ORDER — CEFAZOLIN SODIUM-DEXTROSE 2-4 GM/100ML-% IV SOLN
INTRAVENOUS | Status: AC
  Filled 2024-01-06: qty 100

## 2024-01-06 MED ORDER — CELECOXIB 200 MG PO CAPS
200.0000 mg | ORAL_CAPSULE | Freq: Once | ORAL | Status: AC
  Administered 2024-01-06: 200 mg via ORAL

## 2024-01-06 MED ORDER — SODIUM CHLORIDE (PF) 0.9 % IJ SOLN
INTRAMUSCULAR | Status: AC
  Filled 2024-01-06: qty 10

## 2024-01-06 MED ORDER — PHENYLEPHRINE 80 MCG/ML (10ML) SYRINGE FOR IV PUSH (FOR BLOOD PRESSURE SUPPORT)
PREFILLED_SYRINGE | INTRAVENOUS | Status: DC | PRN
  Administered 2024-01-06 (×2): 160 ug via INTRAVENOUS
  Administered 2024-01-06 (×2): 80 ug via INTRAVENOUS
  Administered 2024-01-06: 160 ug via INTRAVENOUS

## 2024-01-06 MED ORDER — EPHEDRINE SULFATE-NACL 50-0.9 MG/10ML-% IV SOSY
PREFILLED_SYRINGE | INTRAVENOUS | Status: DC | PRN
  Administered 2024-01-06 (×4): 5 mg via INTRAVENOUS

## 2024-01-06 MED ORDER — DROPERIDOL 2.5 MG/ML IJ SOLN: 0.6250 mg | Freq: Once | INTRAMUSCULAR | Status: DC | PRN

## 2024-01-06 MED ORDER — ONDANSETRON HCL 4 MG/2ML IJ SOLN
INTRAMUSCULAR | Status: DC | PRN
  Administered 2024-01-06: 4 mg via INTRAVENOUS

## 2024-01-06 MED ORDER — MIDAZOLAM HCL 2 MG/2ML IJ SOLN
INTRAMUSCULAR | Status: AC
Start: 2024-01-06 — End: 2024-01-06
  Filled 2024-01-06: qty 2

## 2024-01-06 MED ORDER — PROPOFOL 500 MG/50ML IV EMUL
INTRAVENOUS | Status: DC | PRN
  Administered 2024-01-06: 20 ug/kg/min via INTRAVENOUS

## 2024-01-06 MED ORDER — ONDANSETRON HCL 4 MG/2ML IJ SOLN
INTRAMUSCULAR | Status: AC
  Filled 2024-01-06: qty 2

## 2024-01-06 MED ORDER — 0.9 % SODIUM CHLORIDE (POUR BTL) OPTIME
TOPICAL | Status: DC | PRN
Start: 2024-01-06 — End: 2024-01-06
  Administered 2024-01-06: 1000 mL

## 2024-01-06 MED ORDER — BUPIVACAINE HCL (PF) 0.25 % IJ SOLN
INTRAMUSCULAR | Status: AC
  Filled 2024-01-06: qty 30

## 2024-01-06 MED ORDER — LIDOCAINE 2% (20 MG/ML) 5 ML SYRINGE
INTRAMUSCULAR | Status: DC | PRN
  Administered 2024-01-06: 40 mg via INTRAVENOUS

## 2024-01-06 MED ORDER — SODIUM CHLORIDE (PF) 0.9 % IJ SOLN
INTRAMUSCULAR | Status: DC | PRN
  Administered 2024-01-06: 3 mL via INTRAMUSCULAR

## 2024-01-06 MED ORDER — OXYCODONE HCL 5 MG/5ML PO SOLN: 5.0000 mg | Freq: Once | ORAL | Status: DC | PRN

## 2024-01-06 MED ORDER — FENTANYL CITRATE (PF) 100 MCG/2ML IJ SOLN
INTRAMUSCULAR | Status: AC
  Filled 2024-01-06: qty 2

## 2024-01-06 MED ORDER — ACETAMINOPHEN 500 MG PO TABS
1000.0000 mg | ORAL_TABLET | Freq: Once | ORAL | Status: AC
  Administered 2024-01-06: 1000 mg via ORAL

## 2024-01-06 MED ORDER — PROPOFOL 10 MG/ML IV BOLUS
INTRAVENOUS | Status: DC | PRN
  Administered 2024-01-06: 160 mg via INTRAVENOUS

## 2024-01-06 MED ORDER — OXYCODONE HCL 5 MG PO TABS: 5.0000 mg | ORAL_TABLET | Freq: Four times a day (QID) | ORAL | 0 refills | Status: DC | PRN

## 2024-01-06 MED ORDER — CEFAZOLIN SODIUM-DEXTROSE 3-4 GM/150ML-% IV SOLN: 3.0000 g | INTRAVENOUS | Status: DC

## 2024-01-06 MED ORDER — CEFAZOLIN SODIUM-DEXTROSE 2-3 GM-%(50ML) IV SOLR
INTRAVENOUS | Status: DC | PRN
Start: 2024-01-06 — End: 2024-01-06
  Administered 2024-01-06: 2 g via INTRAVENOUS

## 2024-01-06 MED ORDER — OXYCODONE HCL 5 MG PO TABS: 5.0000 mg | ORAL_TABLET | Freq: Once | ORAL | Status: DC | PRN

## 2024-01-06 MED ORDER — BUPIVACAINE LIPOSOME 1.3 % IJ SUSP
INTRAMUSCULAR | Status: DC | PRN
  Administered 2024-01-06: 10 mL

## 2024-01-06 MED ORDER — LACTATED RINGERS IV SOLN: INTRAVENOUS | Status: DC

## 2024-01-06 MED ORDER — PROPOFOL 10 MG/ML IV BOLUS
INTRAVENOUS | Status: AC
Start: 2024-01-06 — End: 2024-01-06
  Filled 2024-01-06: qty 20

## 2024-01-06 MED ORDER — ARTIFICIAL TEARS OPHTHALMIC OINT
TOPICAL_OINTMENT | OPHTHALMIC | Status: AC
  Filled 2024-01-06: qty 10.5

## 2024-01-06 MED ORDER — BUPIVACAINE HCL (PF) 0.5 % IJ SOLN
INTRAMUSCULAR | Status: DC | PRN
  Administered 2024-01-06: 20 mL

## 2024-01-06 MED ORDER — HYDROMORPHONE HCL 1 MG/ML IJ SOLN: 0.2500 mg | INTRAMUSCULAR | Status: DC | PRN

## 2024-01-06 MED ORDER — DEXAMETHASONE SODIUM PHOSPHATE 10 MG/ML IJ SOLN
INTRAMUSCULAR | Status: AC
  Filled 2024-01-06: qty 1

## 2024-01-06 MED ORDER — FENTANYL CITRATE (PF) 250 MCG/5ML IJ SOLN
INTRAMUSCULAR | Status: DC | PRN
  Administered 2024-01-06 (×4): 25 ug via INTRAVENOUS

## 2024-01-06 MED ORDER — ACETAMINOPHEN 500 MG PO TABS
ORAL_TABLET | ORAL | Status: AC
  Filled 2024-01-06: qty 2

## 2024-01-06 MED ORDER — BUPIVACAINE-EPINEPHRINE (PF) 0.25% -1:200000 IJ SOLN
INTRAMUSCULAR | Status: AC
  Filled 2024-01-06: qty 60

## 2024-01-06 MED ORDER — MIDAZOLAM HCL 2 MG/2ML IJ SOLN
2.0000 mg | Freq: Once | INTRAMUSCULAR | Status: AC
  Administered 2024-01-06: 2 mg via INTRAVENOUS

## 2024-01-06 MED ORDER — MIDAZOLAM HCL 2 MG/2ML IJ SOLN
INTRAMUSCULAR | Status: AC
  Filled 2024-01-06: qty 2

## 2024-01-06 MED ORDER — METHYLENE BLUE (ANTIDOTE) 1 % IV SOLN
INTRAVENOUS | Status: AC
  Filled 2024-01-06: qty 10

## 2024-01-06 MED ORDER — FENTANYL CITRATE (PF) 100 MCG/2ML IJ SOLN
50.0000 ug | Freq: Once | INTRAMUSCULAR | Status: AC
  Administered 2024-01-06: 50 ug via INTRAVENOUS

## 2024-01-06 MED ORDER — MAGTRACE LYMPHATIC TRACER
INTRAMUSCULAR | Status: DC | PRN
  Administered 2024-01-06: 2 mL via INTRAMUSCULAR

## 2024-01-06 MED ORDER — CELECOXIB 200 MG PO CAPS
ORAL_CAPSULE | ORAL | Status: AC
  Filled 2024-01-06: qty 1

## 2024-01-06 SURGICAL SUPPLY — 46 items
BINDER BREAST LRG (GAUZE/BANDAGES/DRESSINGS) IMPLANT
BINDER BREAST MEDIUM (GAUZE/BANDAGES/DRESSINGS) IMPLANT
BINDER BREAST XLRG (GAUZE/BANDAGES/DRESSINGS) IMPLANT
BINDER BREAST XXLRG (GAUZE/BANDAGES/DRESSINGS) IMPLANT
BLADE SURG 15 STRL LF DISP TIS (BLADE) ×1 IMPLANT
CANISTER SUC SOCK COL 7IN (MISCELLANEOUS) IMPLANT
CANISTER SUCT 1200ML W/VALVE (MISCELLANEOUS) ×1 IMPLANT
CHLORAPREP W/TINT 26 (MISCELLANEOUS) ×1 IMPLANT
CLIP APPLIE 9.375 MED OPEN (MISCELLANEOUS) ×1 IMPLANT
COVER BACK TABLE 60X90IN (DRAPES) ×1 IMPLANT
COVER MAYO STAND STRL (DRAPES) ×1 IMPLANT
COVER PROBE CYLINDRICAL 5X96 (MISCELLANEOUS) ×1 IMPLANT
DERMABOND ADVANCED .7 DNX12 (GAUZE/BANDAGES/DRESSINGS) ×1 IMPLANT
DRAPE LAPAROSCOPIC ABDOMINAL (DRAPES) ×1 IMPLANT
DRAPE LAPAROTOMY 100X72 PEDS (DRAPES) ×1 IMPLANT
DRAPE UTILITY XL STRL (DRAPES) ×1 IMPLANT
ELECT COATED BLADE 2.86 ST (ELECTRODE) ×1 IMPLANT
ELECTRODE REM PT RTRN 9FT ADLT (ELECTROSURGICAL) ×1 IMPLANT
GLOVE BIOGEL PI IND STRL 8 (GLOVE) ×1 IMPLANT
GLOVE ECLIPSE 8.0 STRL XLNG CF (GLOVE) ×1 IMPLANT
GOWN STRL REUS W/ TWL LRG LVL3 (GOWN DISPOSABLE) ×2 IMPLANT
GOWN STRL REUS W/ TWL XL LVL3 (GOWN DISPOSABLE) ×1 IMPLANT
HEMOSTAT ARISTA ABSORB 3G PWDR (HEMOSTASIS) IMPLANT
HEMOSTAT SNOW SURGICEL 2X4 (HEMOSTASIS) IMPLANT
KIT MARKER MARGIN INK (KITS) ×1 IMPLANT
NDL HYPO 25X1 1.5 SAFETY (NEEDLE) ×1 IMPLANT
NDL SAFETY ECLIPSE 18X1.5 (NEEDLE) IMPLANT
NEEDLE HYPO 25X1 1.5 SAFETY (NEEDLE) ×2 IMPLANT
NS IRRIG 1000ML POUR BTL (IV SOLUTION) ×1 IMPLANT
PACK BASIN DAY SURGERY FS (CUSTOM PROCEDURE TRAY) ×1 IMPLANT
PENCIL SMOKE EVACUATOR (MISCELLANEOUS) ×1 IMPLANT
SLEEVE SCD COMPRESS KNEE MED (STOCKING) ×1 IMPLANT
SOL PREP POV-IOD 4OZ 10% (MISCELLANEOUS) IMPLANT
SPIKE FLUID TRANSFER (MISCELLANEOUS) ×1 IMPLANT
SPONGE T-LAP 4X18 ~~LOC~~+RFID (SPONGE) ×1 IMPLANT
STAPLER SKIN PROX WIDE 3.9 (STAPLE) IMPLANT
SUT MNCRL AB 4-0 PS2 18 (SUTURE) ×1 IMPLANT
SUT MON AB 4-0 PC3 18 (SUTURE) ×1 IMPLANT
SUT SILK 2 0 SH (SUTURE) IMPLANT
SUT VICRYL 3-0 CR8 SH (SUTURE) ×1 IMPLANT
SYR CONTROL 10ML LL (SYRINGE) ×1 IMPLANT
TOWEL GREEN STERILE FF (TOWEL DISPOSABLE) ×2 IMPLANT
TRACER MAGTRACE VIAL (MISCELLANEOUS) IMPLANT
TRAY FAXITRON CT DISP (TRAY / TRAY PROCEDURE) ×1 IMPLANT
TUBE CONNECTING 20X1/4 (TUBING) ×1 IMPLANT
YANKAUER SUCT BULB TIP NO VENT (SUCTIONS) ×1 IMPLANT

## 2024-01-06 NOTE — Transfer of Care (Signed)
 Immediate Anesthesia Transfer of Care Note  Patient: Kelsey Carlson  Procedure(s) Performed: BREAST LUMPECTOMY WITH RADIOACTIVE SEED AND SENTINEL LYMPH NODE BIOPSY (Left: Breast) EXCISION OF BREAST BIOPSY (Left: Breast)  Patient Location: PACU  Anesthesia Type:GA combined with regional for post-op pain  Level of Consciousness: drowsy  Airway & Oxygen Therapy: Patient Spontanous Breathing and Patient connected to face mask oxygen  Post-op Assessment: Report given to RN and Post -op Vital signs reviewed and stable  Post vital signs: Reviewed and stable  Last Vitals:  Vitals Value Taken Time  BP 115/63 (78)   Temp    Pulse 102 01/06/24 09:49  Resp 10 01/06/24 09:49  SpO2 98 % 01/06/24 09:49  Vitals shown include unfiled device data.  Last Pain:  Vitals:   01/06/24 0650  TempSrc: Temporal  PainSc: 0-No pain      Patients Stated Pain Goal: 4 (01/06/24 0650)  Complications: No notable events documented.

## 2024-01-06 NOTE — Progress Notes (Signed)
Assisted Dr. Gavin Potters with left, pectoralis, ultrasound guided block. Side rails up, monitors on throughout procedure. See vital signs in flow sheet. Tolerated Procedure well.

## 2024-01-06 NOTE — Interval H&P Note (Signed)
 History and Physical Interval Note:  01/06/2024 7:18 AM  Kelsey Carlson  has presented today for surgery, with the diagnosis of LEFT BREAST CANCER.  The various methods of treatment have been discussed with the patient and family. After consideration of risks, benefits and other options for treatment, the patient has consented to  Procedure(s) with comments: BREAST LUMPECTOMY WITH RADIOACTIVE SEED AND SENTINEL LYMPH NODE BIOPSY (Left) - BRACKETED LEFT BREAST SEED LUMPECTOMY LEFT SENTINEL LYMPH NODE MAPPING EXCISION OF BREAST BIOPSY (Left) - LEFT NIPPLE BIOPSY as a surgical intervention.  The patient's history has been reviewed, patient examined, no change in status, stable for surgery.  I have reviewed the patient's chart and labs.  Questions were answered to the patient's satisfaction.   Pt wants left nipple removed after further discussion with plastics and dermatology. Plan to remove nipple with the tumor resection.    The procedure has been discussed with the patient. Alternatives to surgery have been discussed with the patient.  Risks of surgery include bleeding,  Infection,  Seroma formation, death,  and the need for further surgery.   The patient understands and wishes to proceed. Sentinel lymph node mapping and dissection has been discussed with the patient.  Risk of bleeding,  Infection,  Seroma formation,  Additional procedures,,  Shoulder weakness ,  Shoulder stiffness,  Nerve and blood vessel injury and reaction to the mapping dyes have been discussed.  Alternatives to surgery have been discussed with the patient.  The patient agrees to proceed.   Kelsey Carlson

## 2024-01-06 NOTE — Discharge Instructions (Addendum)
 [facility] Office Phone Number [phone]  BREAST BIOPSY/ PARTIAL MASTECTOMY: POST OP INSTRUCTIONS  Always review your discharge instruction sheet given to you by the facility where your surgery was performed.  IF YOU HAVE DISABILITY OR FAMILY LEAVE FORMS, YOU MUST BRING THEM TO THE OFFICE FOR PROCESSING.  DO NOT GIVE THEM TO YOUR DOCTOR.  A prescription for pain medication may be given to you upon discharge.  Take your pain medication as prescribed, if needed.  If narcotic pain medicine is not needed, then you may take acetaminophen  (Tylenol ) or ibuprofen  (Advil ) as needed. Take your usually prescribed medications unless otherwise directed If you need a refill on your pain medication, please contact your pharmacy.  They will contact our office to request authorization.  Prescriptions will not be filled after 5pm or on week-ends. You should eat very light the first 24 hours after surgery, such as soup, crackers, pudding, etc.  Resume your normal diet the day after surgery. Most patients will experience some swelling and bruising in the breast.  Ice packs and a good support bra will help.  Swelling and bruising can take several days to resolve.  It is common to experience some constipation if taking pain medication after surgery.  Increasing fluid intake and taking a stool softener will usually help or prevent this problem from occurring.  A mild laxative (Milk of Magnesia or Miralax) should be taken according to package directions if there are no bowel movements after 48 hours. Unless discharge instructions indicate otherwise, you may remove your bandages 24-48 hours after surgery, and you may shower at that time.  You may have steri-strips (small skin tapes) in place directly over the incision.  These strips should be left on the skin for 7-10 days.  If your surgeon used skin glue on the incision, you may shower in 24 hours.  The glue will flake off over the next 2-3 weeks.  Any  sutures or staples will be removed at the office during your follow-up visit. ACTIVITIES:  You may resume regular daily activities (gradually increasing) beginning the next day.  Wearing a good support bra or sports bra minimizes pain and swelling.  You may have sexual intercourse when it is comfortable. You may drive when you no longer are taking prescription pain medication, you can comfortably wear a seatbelt, and you can safely maneuver your car and apply brakes. RETURN TO WORK:  ______________________________________________________________________________________ Kelsey Carlson should see your doctor in the office for a follow-up appointment approximately two weeks after your surgery.  Your doctor's nurse will typically make your follow-up appointment when she calls you with your pathology report.  Expect your pathology report 2-3 business days after your surgery.  You may call to check if you do not hear from us  after three days. OTHER INSTRUCTIONS: _______________________________________________________________________________________________ _____________________________________________________________________________________________________________________________________ _____________________________________________________________________________________________________________________________________ _____________________________________________________________________________________________________________________________________  WHEN TO CALL YOUR DOCTOR: Fever over 101.0 Nausea and/or vomiting. Extreme swelling or bruising. Continued bleeding from incision. Increased pain, redness, or drainage from the incision.  The clinic staff is available to answer your questions during regular business hours.  Please don't hesitate to call and ask to speak to one of the nurses for clinical concerns.  If you have a medical emergency, go to the nearest emergency room or call 911.  A surgeon from [facility] is always on call at the hospital.  For further questions, please visit [email or web address]     Post Anesthesia Home Care Instructions  Activity: Get plenty of rest for the remainder  of the day. A responsible individual must stay with you for 24 hours following the procedure.  For the next 24 hours, DO NOT: -Drive a car -Advertising copywriter -Drink alcoholic beverages -Take any medication unless instructed by your physician -Make any legal decisions or sign important papers.  Meals: Start with liquid foods such as gelatin or soup. Progress to regular foods as tolerated. Avoid greasy, spicy, heavy foods. If nausea and/or vomiting occur, drink only clear liquids until the nausea and/or vomiting subsides. Call your physician if vomiting continues.  Special Instructions/Symptoms: Your throat may feel dry or sore from the anesthesia or the breathing tube placed in your throat during surgery. If this causes discomfort, gargle with warm salt water. The discomfort should disappear within 24 hours.  May have Tylenol  after 1:00pm if needed.     Regional Anesthesia Blocks  1. You may not be able to move or feel the blocked extremity after a regional anesthetic block. This may last may last from 3-48 hours after placement, but it will go away. The length of time depends on the medication injected and your individual response to the medication. As the nerves start to wake up, you may experience tingling as the movement and feeling returns to your extremity. If the numbness and inability to move your extremity has not gone away after 48 hours, please call your surgeon.   2. The extremity that is blocked will need to be protected until the numbness is gone and the strength has returned. Because you cannot feel it, you will need to take extra care to avoid injury. Because it may be weak, you may have difficulty moving it or using it. You may not know what position it is in without  looking at it while the block is in effect.  3. For blocks in the legs and feet, returning to weight bearing and walking needs to be done carefully. You will need to wait until the numbness is entirely gone and the strength has returned. You should be able to move your leg and foot normally before you try and bear weight or walk. You will need someone to be with you when you first try to ensure you do not fall and possibly risk injury.  4. Bruising and tenderness at the needle site are common side effects and will resolve in a few days.  5. Persistent numbness or new problems with movement should be communicated to the surgeon or the [facility] [phone] [facility] [phone]).Information for Discharge Teaching: EXPAREL  (bupivacaine  liposome injectable suspension)   Pain relief is important to your recovery. The goal is to control your pain so you can move easier and return to your normal activities as soon as possible after your procedure. Your physician may use several types of medicines to manage pain, swelling, and more.  Your surgeon or anesthesiologist gave you EXPAREL (bupivacaine ) to help control your pain after surgery.  EXPAREL  is a local anesthetic designed to release slowly over an extended period of time to provide pain relief by numbing the tissue around the surgical site. EXPAREL  is designed to release pain medication over time and can control pain for up to 72 hours. Depending on how you respond to EXPAREL , you may require less pain medication during your recovery. EXPAREL  can help reduce or eliminate the need for opioids during the first few days after surgery when pain relief is needed the most. EXPAREL  is not an opioid and is not addictive. It does not  cause sleepiness or sedation.   Important! A teal colored band has been placed on your arm with the date, time and amount of EXPAREL  you have received. Please leave this armband in place for the  full 96 hours following administration, and then you may remove the band. If you return to the hospital for any reason within 96 hours following the administration of EXPAREL , the armband provides important information that your health care providers to know, and alerts them that you have received this anesthetic.    Possible side effects of EXPAREL : Temporary loss of sensation or ability to move in the area where medication was injected. Nausea, vomiting, constipation Rarely, numbness and tingling in your mouth or lips, lightheadedness, or anxiety may occur. Call your doctor right away if you think you may be experiencing any of these sensations, or if you have other questions regarding possible side effects.  Follow all other discharge instructions given to you by your surgeon or nurse. Eat a healthy diet and drink plenty of water or other fluids.

## 2024-01-06 NOTE — Anesthesia Procedure Notes (Addendum)
 Anesthesia Regional Block: Pectoralis block   Pre-Anesthetic Checklist: , timeout performed,  Correct Patient, Correct Site, Correct Laterality,  Correct Procedure, Correct Position, site marked,  Risks and benefits discussed,  Surgical consent,  Pre-op evaluation,  At surgeon's request and post-op pain management  Laterality: Left  Prep: Dura Prep       Needles:  Injection technique: Single-shot  Needle Type: Echogenic Stimulator Needle     Needle Length: 5cm  Needle Gauge: 20     Additional Needles:   Procedures:,,,, ultrasound used (permanent image in chart),,    Narrative:  Start time: 01/06/2024 7:05 AM End time: 01/06/2024 7:10 AM Injection made incrementally with aspirations every 5 mL.  Performed by: Personally  Anesthesiologist: Dorethea Cordella SQUIBB, DO  Additional Notes: Patient identified. Risks/Benefits/Options discussed with patient including but not limited to bleeding, infection, nerve damage, failed block, incomplete pain control. Patient expressed understanding and wished to proceed. All questions were answered. Sterile technique was used throughout the entire procedure. Please see nursing notes for vital signs. Aspirated in 5cc intervals with injection for negative confirmation. Patient was given instructions on fall risk and not to get out of bed. All questions and concerns addressed with instructions to call with any issues or inadequate analgesia.

## 2024-01-06 NOTE — Op Note (Signed)
 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  She desired breast conserving surgery with reduction in oncoplastic reconstruction.  She has been seen by plastic surgery.  Initial evaluation showed involvement of her nipple areolar complex prior to neoadjuvant chemotherapy.  She had mixed response to chemotherapy.  She had concerns about the nipple initially went to preserve it but after further discussion with the plastic surgeon and her dermatologist, she was to have the nipple removed.  We discussed this in the holding area and reviewed that.  She was marked by plastic surgery as well for future breast reduction.  We discussed the orientation of the incisions as well as the group.  A pectoral block was administered to the left side.  Of note 2 cc were placed in the left breast by radiology.The procedure has been discussed with the patient. Alternatives to surgery have been discussed with the patient.  Risks of surgery include bleeding,  Infection,  Seroma formation, death,  and the need for further surgery.   The patient understands and wishes to proceed. Sentinel lymph node mapping and dissection has been discussed with the patient.  Risk of bleeding,  Infection,  Seroma formation,  Additional procedures,,  Shoulder weakness ,  Shoulder stiffness, lymphedema nerve and blood vessel injury and reaction to the  mapping dyes have been discussed.  Alternatives to surgery have been discussed with the patient.  The patient agrees to proceed.    Description of procedure: The patient was met in the holding area and questions were answered.  Left breast pectoral block was placed by anesthesia.  She was marked in the plastic surgeon and I reviewed this with the surgeon as well as well as orientation of the incision.  All questions were answered.  She was taken back to the op room placed upon the OR table.  After induction of general anesthesia, the left breast was prepped and draped in a sterile fashion and timeout was performed.  The mag trace and methylene blue  were on the field and injected sterilely after timeout.  Imaging was available.  The dyes were massaged for 5 minutes.  An elliptical incision was made around the nipple areolar complex.  This was carried above and below this.  I then resected the nipple and then made a lateral toward where the 2 clips were since her disease was in the left breast upper outer quadrant.  Both clips were identified with the neoprobe.  This large area was resected en bloc.  Imaging revealed both seeds but was unclear to me where the second clip was.  Certainly this could have been dislodged during dissection.  We had wide margins around the seed but I opted to excise all margins of the lumpectomy cavity.  All margins were oriented with ink.  This created a large cavity I felt the resection grossly was negative and I did not see with any residual disease.  All specimens were oriented and I did not see  the second clip and given the wide resection I felt comfortable that the area was adequately resected.  The cavity was irrigated.  Small clips were placed for small bleeding vessels.  Once this was irrigated we then closed it with 3-0 Vicryl.  She will return next week for oncoplastic reduction per plastics as long as the wounds are clear.  The mag trace probe was used.  Increased activity  was noted in the left axilla and a transverse incision was made 4 cm in the left axilla.  Dissection was carried down to the subcutaneous fatty tissues until we entered the left axillary contents level 1.  Blue uptake was noted as well as mag trace uptake was noted in 3 nodes.  These were dissected out en bloc.  There is some bleeding from a small vessel from the lymph node cluster of under the pectoralis muscle.  This is controlled with clips and cautery.  Once the node was removed the wound was irrigated.  Saw no evidence of any bleeding.  There was some oozing from the operative and controlled with a combination of cautery as well as Surgicel snow and Surgicel powder with excellent result.  Further inspection revealed no further ongoing bleeding.  The nature passed off the field.  There is no further activity spikes in the left axilla to indicate any further sentinel nodes nor did I see any accompanying discoloration to indicate as well.  Further inspection revealed no ongoing bleeding.  Deep tissue planes approximated with 3-0 Vicryl.  Normal to close the skin.  Of note the long thoracic nerve, thoracodorsal trunk and axillary vein were all preserved.  This dissection Dermabond applied to both incisions.  Breast binder was placed.  All counts were found to be correct.  The patient was awoke extubated taken to recovery in satisfactory condition.

## 2024-01-06 NOTE — Anesthesia Procedure Notes (Signed)
 Procedure Name: LMA Insertion Date/Time: 01/06/2024 7:33 AM  Performed by: Claudene Hoy CROME, CRNAPre-anesthesia Checklist: Patient identified, Emergency Drugs available, Suction available and Patient being monitored Patient Re-evaluated:Patient Re-evaluated prior to induction Oxygen Delivery Method: Circle System Utilized Preoxygenation: Pre-oxygenation with 100% oxygen Induction Type: IV induction Ventilation: Mask ventilation without difficulty LMA: LMA inserted LMA Size: 4.0 Number of attempts: 1 Placement Confirmation: positive ETCO2 Tube secured with: Tape Dental Injury: Teeth and Oropharynx as per pre-operative assessment

## 2024-01-06 NOTE — Anesthesia Preprocedure Evaluation (Signed)
 Anesthesia Evaluation  Patient identified by MRN, date of birth, ID band Patient awake    Reviewed: NPO status , Patient's Chart, lab work & pertinent test results  Airway Mallampati: II  TM Distance: >3 FB Neck ROM: Full    Dental no notable dental hx.    Pulmonary sleep apnea , former smoker   Pulmonary exam normal        Cardiovascular negative cardio ROS  Rhythm:Regular Rate:Normal     Neuro/Psych    Depression    negative neurological ROS     GI/Hepatic Neg liver ROS,GERD  ,,  Endo/Other  negative endocrine ROS    Renal/GU negative Renal ROS  negative genitourinary   Musculoskeletal  (+) Arthritis , Osteoarthritis,  Breast Ca   Abdominal Normal abdominal exam  (+)   Peds  Hematology Lab Results      Component                Value               Date                      WBC                      4.5                 12/01/2023                HGB                      10.6 (L)            12/01/2023                HCT                      31.5 (L)            12/01/2023                MCV                      95.5                12/01/2023                PLT                      330                 12/01/2023              Anesthesia Other Findings   Reproductive/Obstetrics                              Anesthesia Physical Anesthesia Plan  ASA: 3  Anesthesia Plan: General and Regional   Post-op Pain Management: Regional block* and Tylenol  PO (pre-op)*   Induction: Intravenous  PONV Risk Score and Plan: 3 and Ondansetron , Dexamethasone , Treatment may vary due to age or medical condition and Midazolam   Airway Management Planned: Mask and LMA  Additional Equipment: None  Intra-op Plan:   Post-operative Plan: Extubation in OR  Informed Consent: I have reviewed the patients History and Physical, chart, labs and discussed the procedure including the risks, benefits and alternatives  for the proposed anesthesia with  the patient or authorized representative who has indicated his/her understanding and acceptance.     Dental advisory given  Plan Discussed with: CRNA  Anesthesia Plan Comments:         Anesthesia Quick Evaluation

## 2024-01-07 ENCOUNTER — Other Ambulatory Visit: Payer: Self-pay

## 2024-01-07 ENCOUNTER — Encounter (HOSPITAL_BASED_OUTPATIENT_CLINIC_OR_DEPARTMENT_OTHER): Payer: Self-pay | Admitting: Surgery

## 2024-01-07 NOTE — Anesthesia Postprocedure Evaluation (Signed)
 Anesthesia Post Note  Patient: Kelsey Carlson  Procedure(s) Performed: BREAST LUMPECTOMY WITH RADIOACTIVE SEED AND SENTINEL LYMPH NODE BIOPSY (Left: Breast) EXCISION OF BREAST BIOPSY (Left: Breast)     Patient location during evaluation: PACU Anesthesia Type: Regional and General Level of consciousness: awake and alert Pain management: pain level controlled Vital Signs Assessment: post-procedure vital signs reviewed and stable Respiratory status: spontaneous breathing, nonlabored ventilation, respiratory function stable and patient connected to nasal cannula oxygen Cardiovascular status: blood pressure returned to baseline and stable Postop Assessment: no apparent nausea or vomiting Anesthetic complications: no   No notable events documented.  Last Vitals:  Vitals:   01/06/24 1030 01/06/24 1055  BP: 119/73 136/70  Pulse: 96 (!) 103  Resp: 12 16  Temp:  (!) 36.4 C  SpO2: 93% 94%    Last Pain:  Vitals:   01/07/24 1432  TempSrc:   PainSc: 1                  Kelsey Carlson

## 2024-01-08 ENCOUNTER — Other Ambulatory Visit: Payer: Self-pay

## 2024-01-08 ENCOUNTER — Encounter (HOSPITAL_BASED_OUTPATIENT_CLINIC_OR_DEPARTMENT_OTHER): Payer: Self-pay | Admitting: Plastic Surgery

## 2024-01-08 LAB — SURGICAL PATHOLOGY

## 2024-01-09 ENCOUNTER — Ambulatory Visit: Payer: Self-pay | Admitting: Surgery

## 2024-01-12 ENCOUNTER — Encounter: Payer: Self-pay | Admitting: *Deleted

## 2024-01-13 ENCOUNTER — Ambulatory Visit (HOSPITAL_BASED_OUTPATIENT_CLINIC_OR_DEPARTMENT_OTHER): Payer: Self-pay | Admitting: Anesthesiology

## 2024-01-13 ENCOUNTER — Ambulatory Visit (HOSPITAL_BASED_OUTPATIENT_CLINIC_OR_DEPARTMENT_OTHER)
Admission: RE | Admit: 2024-01-13 | Discharge: 2024-01-13 | Disposition: A | Discharge status: Home / Self Care | Attending: Plastic Surgery | Admitting: Plastic Surgery

## 2024-01-13 ENCOUNTER — Other Ambulatory Visit: Payer: Self-pay

## 2024-01-13 ENCOUNTER — Encounter (HOSPITAL_BASED_OUTPATIENT_CLINIC_OR_DEPARTMENT_OTHER)
Admission: RE | Disposition: A | Discharge status: Home / Self Care | Payer: Self-pay | Source: Home / Self Care | Attending: Plastic Surgery

## 2024-01-13 ENCOUNTER — Encounter (HOSPITAL_BASED_OUTPATIENT_CLINIC_OR_DEPARTMENT_OTHER): Payer: Self-pay | Admitting: Plastic Surgery

## 2024-01-13 DIAGNOSIS — Z9221 Personal history of antineoplastic chemotherapy: Secondary | ICD-10-CM | POA: Diagnosis not present

## 2024-01-13 DIAGNOSIS — F32A Depression, unspecified: Secondary | ICD-10-CM | POA: Diagnosis not present

## 2024-01-13 DIAGNOSIS — G8929 Other chronic pain: Secondary | ICD-10-CM | POA: Diagnosis not present

## 2024-01-13 DIAGNOSIS — Z170 Estrogen receptor positive status [ER+]: Secondary | ICD-10-CM

## 2024-01-13 DIAGNOSIS — C50812 Malignant neoplasm of overlapping sites of left female breast: Secondary | ICD-10-CM

## 2024-01-13 DIAGNOSIS — Z803 Family history of malignant neoplasm of breast: Secondary | ICD-10-CM | POA: Diagnosis not present

## 2024-01-13 DIAGNOSIS — N6489 Other specified disorders of breast: Secondary | ICD-10-CM | POA: Insufficient documentation

## 2024-01-13 DIAGNOSIS — N62 Hypertrophy of breast: Secondary | ICD-10-CM

## 2024-01-13 DIAGNOSIS — Z1732 Human epidermal growth factor receptor 2 negative status: Secondary | ICD-10-CM | POA: Diagnosis not present

## 2024-01-13 DIAGNOSIS — G4733 Obstructive sleep apnea (adult) (pediatric): Secondary | ICD-10-CM | POA: Diagnosis not present

## 2024-01-13 DIAGNOSIS — N6021 Fibroadenosis of right breast: Secondary | ICD-10-CM | POA: Insufficient documentation

## 2024-01-13 DIAGNOSIS — Z87891 Personal history of nicotine dependence: Secondary | ICD-10-CM | POA: Insufficient documentation

## 2024-01-13 DIAGNOSIS — Z1721 Progesterone receptor positive status: Secondary | ICD-10-CM | POA: Insufficient documentation

## 2024-01-13 HISTORY — PX: BREAST REDUCTION SURGERY: SHX8

## 2024-01-13 HISTORY — PX: PORT-A-CATH REMOVAL: SHX5289

## 2024-01-13 SURGERY — MAMMOPLASTY, REDUCTION
Anesthesia: General | Site: Chest | Laterality: Right

## 2024-01-13 MED ORDER — OXYCODONE HCL 5 MG/5ML PO SOLN: 5.0000 mg | Freq: Once | ORAL | Status: DC | PRN

## 2024-01-13 MED ORDER — LIDOCAINE 2% (20 MG/ML) 5 ML SYRINGE
INTRAMUSCULAR | Status: AC
  Filled 2024-01-13: qty 5

## 2024-01-13 MED ORDER — SUGAMMADEX SODIUM 200 MG/2ML IV SOLN
INTRAVENOUS | Status: DC | PRN
  Administered 2024-01-13: 200 mg via INTRAVENOUS

## 2024-01-13 MED ORDER — GABAPENTIN 300 MG PO CAPS
300.0000 mg | ORAL_CAPSULE | ORAL | Status: AC
  Administered 2024-01-13: 300 mg via ORAL

## 2024-01-13 MED ORDER — GABAPENTIN 300 MG PO CAPS
ORAL_CAPSULE | ORAL | Status: AC
  Filled 2024-01-13: qty 1

## 2024-01-13 MED ORDER — ONDANSETRON HCL 4 MG/2ML IJ SOLN
INTRAMUSCULAR | Status: AC
  Filled 2024-01-13: qty 2

## 2024-01-13 MED ORDER — FENTANYL CITRATE (PF) 100 MCG/2ML IJ SOLN
INTRAMUSCULAR | Status: AC
Start: 2024-01-13 — End: 2024-01-13
  Filled 2024-01-13: qty 2

## 2024-01-13 MED ORDER — DEXAMETHASONE SODIUM PHOSPHATE 10 MG/ML IJ SOLN
INTRAMUSCULAR | Status: DC | PRN
  Administered 2024-01-13: 10 mg via INTRAVENOUS

## 2024-01-13 MED ORDER — ACETAMINOPHEN 500 MG PO TABS
1000.0000 mg | ORAL_TABLET | ORAL | Status: DC
Start: 2024-01-13 — End: 2024-01-13

## 2024-01-13 MED ORDER — ACETAMINOPHEN 500 MG PO TABS
ORAL_TABLET | ORAL | Status: AC
  Filled 2024-01-13: qty 2

## 2024-01-13 MED ORDER — PHENYLEPHRINE HCL-NACL 20-0.9 MG/250ML-% IV SOLN
INTRAVENOUS | Status: DC | PRN
  Administered 2024-01-13: 25 ug/min via INTRAVENOUS

## 2024-01-13 MED ORDER — FENTANYL CITRATE (PF) 100 MCG/2ML IJ SOLN
INTRAMUSCULAR | Status: DC | PRN
  Administered 2024-01-13: 50 ug via INTRAVENOUS
  Administered 2024-01-13: 100 ug via INTRAVENOUS
  Administered 2024-01-13: 50 ug via INTRAVENOUS

## 2024-01-13 MED ORDER — PROPOFOL 10 MG/ML IV BOLUS
INTRAVENOUS | Status: DC | PRN
  Administered 2024-01-13: 150 mg via INTRAVENOUS

## 2024-01-13 MED ORDER — CEFAZOLIN SODIUM-DEXTROSE 2-4 GM/100ML-% IV SOLN
2.0000 g | INTRAVENOUS | Status: AC
  Administered 2024-01-13: 2 g via INTRAVENOUS

## 2024-01-13 MED ORDER — LACTATED RINGERS IV SOLN: INTRAVENOUS | Status: DC

## 2024-01-13 MED ORDER — PHENYLEPHRINE 80 MCG/ML (10ML) SYRINGE FOR IV PUSH (FOR BLOOD PRESSURE SUPPORT)
PREFILLED_SYRINGE | INTRAVENOUS | Status: AC
  Filled 2024-01-13: qty 10

## 2024-01-13 MED ORDER — ACETAMINOPHEN 500 MG PO TABS
1000.0000 mg | ORAL_TABLET | Freq: Once | ORAL | Status: AC
  Administered 2024-01-13: 1000 mg via ORAL

## 2024-01-13 MED ORDER — MIDAZOLAM HCL 2 MG/2ML IJ SOLN
INTRAMUSCULAR | Status: AC
Start: 2024-01-13 — End: 2024-01-13
  Filled 2024-01-13: qty 2

## 2024-01-13 MED ORDER — MIDAZOLAM HCL 5 MG/5ML IJ SOLN
INTRAMUSCULAR | Status: DC | PRN
  Administered 2024-01-13: 2 mg via INTRAVENOUS

## 2024-01-13 MED ORDER — 0.9 % SODIUM CHLORIDE (POUR BTL) OPTIME
TOPICAL | Status: DC | PRN
  Administered 2024-01-13: 1000 mL

## 2024-01-13 MED ORDER — PROPOFOL 10 MG/ML IV BOLUS
INTRAVENOUS | Status: AC
  Filled 2024-01-13: qty 20

## 2024-01-13 MED ORDER — FENTANYL CITRATE (PF) 100 MCG/2ML IJ SOLN
INTRAMUSCULAR | Status: AC
  Filled 2024-01-13: qty 2

## 2024-01-13 MED ORDER — LIDOCAINE 2% (20 MG/ML) 5 ML SYRINGE
INTRAMUSCULAR | Status: DC | PRN
  Administered 2024-01-13: 80 mg via INTRAVENOUS

## 2024-01-13 MED ORDER — CELECOXIB 200 MG PO CAPS
ORAL_CAPSULE | ORAL | Status: AC
  Filled 2024-01-13: qty 1

## 2024-01-13 MED ORDER — HYDROMORPHONE HCL 1 MG/ML IJ SOLN
INTRAMUSCULAR | Status: AC
  Filled 2024-01-13: qty 0.5

## 2024-01-13 MED ORDER — DEXAMETHASONE SODIUM PHOSPHATE 10 MG/ML IJ SOLN
INTRAMUSCULAR | Status: AC
  Filled 2024-01-13: qty 1

## 2024-01-13 MED ORDER — CELECOXIB 200 MG PO CAPS
200.0000 mg | ORAL_CAPSULE | ORAL | Status: AC
  Administered 2024-01-13: 200 mg via ORAL

## 2024-01-13 MED ORDER — FENTANYL CITRATE (PF) 100 MCG/2ML IJ SOLN
25.0000 ug | INTRAMUSCULAR | Status: DC | PRN
  Administered 2024-01-13 (×2): 50 ug via INTRAVENOUS

## 2024-01-13 MED ORDER — PHENYLEPHRINE HCL (PRESSORS) 10 MG/ML IV SOLN
INTRAVENOUS | Status: DC | PRN
  Administered 2024-01-13 (×5): 80 ug via INTRAVENOUS

## 2024-01-13 MED ORDER — EPHEDRINE SULFATE (PRESSORS) 50 MG/ML IJ SOLN
INTRAMUSCULAR | Status: DC | PRN
  Administered 2024-01-13: 5 mg via INTRAVENOUS
  Administered 2024-01-13: 10 mg via INTRAVENOUS

## 2024-01-13 MED ORDER — ONDANSETRON HCL 4 MG/2ML IJ SOLN
INTRAMUSCULAR | Status: DC | PRN
  Administered 2024-01-13: 4 mg via INTRAVENOUS

## 2024-01-13 MED ORDER — ONDANSETRON HCL 4 MG/2ML IJ SOLN: 4.0000 mg | Freq: Once | INTRAMUSCULAR | Status: DC | PRN

## 2024-01-13 MED ORDER — HYDROMORPHONE HCL 1 MG/ML IJ SOLN
INTRAMUSCULAR | Status: DC | PRN
  Administered 2024-01-13: .5 mg via INTRAVENOUS

## 2024-01-13 MED ORDER — BUPIVACAINE HCL (PF) 0.5 % IJ SOLN
INTRAMUSCULAR | Status: DC | PRN
  Administered 2024-01-13: 30 mL

## 2024-01-13 MED ORDER — EPHEDRINE 5 MG/ML INJ
INTRAVENOUS | Status: AC
  Filled 2024-01-13: qty 5

## 2024-01-13 MED ORDER — OXYCODONE HCL 5 MG PO TABS: 5.0000 mg | ORAL_TABLET | Freq: Once | ORAL | Status: DC | PRN

## 2024-01-13 MED ORDER — ROCURONIUM BROMIDE 10 MG/ML (PF) SYRINGE
PREFILLED_SYRINGE | INTRAVENOUS | Status: AC
  Filled 2024-01-13: qty 10

## 2024-01-13 MED ORDER — CEFAZOLIN SODIUM-DEXTROSE 2-4 GM/100ML-% IV SOLN
INTRAVENOUS | Status: AC
  Filled 2024-01-13: qty 100

## 2024-01-13 MED ORDER — ROCURONIUM BROMIDE 100 MG/10ML IV SOLN
INTRAVENOUS | Status: DC | PRN
Start: 2024-01-13 — End: 2024-01-13
  Administered 2024-01-13: 50 mg via INTRAVENOUS

## 2024-01-13 SURGICAL SUPPLY — 43 items
BLADE SURG 10 STRL SS (BLADE) ×8 IMPLANT
BLADE SURG 15 STRL LF DISP TIS (BLADE) ×2 IMPLANT
BNDG GAUZE DERMACEA FLUFF 4 (GAUZE/BANDAGES/DRESSINGS) ×4 IMPLANT
CANISTER SUCT 1200ML W/VALVE (MISCELLANEOUS) ×2 IMPLANT
COVER BACK TABLE 60X90IN (DRAPES) ×2 IMPLANT
COVER MAYO STAND STRL (DRAPES) ×2 IMPLANT
DERMABOND ADVANCED .7 DNX12 (GAUZE/BANDAGES/DRESSINGS) ×4 IMPLANT
DRAIN CHANNEL 15F RND FF W/TCR (WOUND CARE) IMPLANT
DRAIN CHANNEL 19F RND (DRAIN) IMPLANT
DRAPE U-SHAPE 76X120 STRL (DRAPES) ×2 IMPLANT
DRAPE UTILITY XL STRL (DRAPES) ×2 IMPLANT
DURAPREP 26ML APPLICATOR (WOUND CARE) IMPLANT
ELECT COATED BLADE 2.86 ST (ELECTRODE) ×2 IMPLANT
ELECTRODE REM PT RTRN 9FT ADLT (ELECTROSURGICAL) ×2 IMPLANT
EVACUATOR SILICONE 100CC (DRAIN) IMPLANT
GAUZE PAD ABD 8X10 STRL (GAUZE/BANDAGES/DRESSINGS) ×4 IMPLANT
GAUZE SPONGE 2X2 STRL 8-PLY (GAUZE/BANDAGES/DRESSINGS) IMPLANT
GAUZE SPONGE 4X4 12PLY STRL LF (GAUZE/BANDAGES/DRESSINGS) IMPLANT
GLOVE BIO SURGEON STRL SZ 6 (GLOVE) ×4 IMPLANT
GOWN STRL REUS W/ TWL LRG LVL3 (GOWN DISPOSABLE) ×4 IMPLANT
MARKER SKIN DUAL TIP RULER LAB (MISCELLANEOUS) IMPLANT
NDL HYPO 25X1 1.5 SAFETY (NEEDLE) ×2 IMPLANT
NEEDLE HYPO 25X1 1.5 SAFETY (NEEDLE) ×2 IMPLANT
NS IRRIG 1000ML POUR BTL (IV SOLUTION) ×2 IMPLANT
PACK BASIN DAY SURGERY FS (CUSTOM PROCEDURE TRAY) ×2 IMPLANT
PENCIL SMOKE EVACUATOR (MISCELLANEOUS) ×2 IMPLANT
PIN SAFETY STERILE (MISCELLANEOUS) ×2 IMPLANT
SHEET MEDIUM DRAPE 40X70 STRL (DRAPES) ×2 IMPLANT
SLEEVE SCD COMPRESS KNEE MED (STOCKING) ×2 IMPLANT
SPONGE T-LAP 18X18 ~~LOC~~+RFID (SPONGE) ×6 IMPLANT
STAPLER SKIN PROX WIDE 3.9 (STAPLE) ×2 IMPLANT
SUT ETHILON 2 0 FS 18 (SUTURE) IMPLANT
SUT MNCRL AB 3-0 PS2 18 (SUTURE) IMPLANT
SUT MNCRL AB 4-0 PS2 18 (SUTURE) IMPLANT
SUT VIC AB 3-0 PS1 18XBRD (SUTURE) IMPLANT
SUT VIC AB 4-0 PS2 18 (SUTURE) IMPLANT
SYR BULB EAR ULCER 3OZ GRN STR (SYRINGE) IMPLANT
SYR BULB IRRIG 60ML STRL (SYRINGE) ×2 IMPLANT
SYR CONTROL 10ML LL (SYRINGE) ×2 IMPLANT
TOWEL GREEN STERILE FF (TOWEL DISPOSABLE) ×4 IMPLANT
TUBE CONNECTING 20X1/4 (TUBING) ×2 IMPLANT
UNDERPAD 30X36 HEAVY ABSORB (UNDERPADS AND DIAPERS) ×4 IMPLANT
YANKAUER SUCT BULB TIP NO VENT (SUCTIONS) ×2 IMPLANT

## 2024-01-13 NOTE — Transfer of Care (Signed)
 Immediate Anesthesia Transfer of Care Note  Patient: Kelsey Carlson  Procedure(s) Performed: LEFT ONCOPLASTIC RECONSTRUCTION, RIGHT BREAST REDUCTION (Bilateral: Breast) REMOVAL PORT-A-CATH (Right: Chest)  Patient Location: PACU  Anesthesia Type:General  Level of Consciousness: drowsy  Airway & Oxygen Therapy: Patient Spontanous Breathing and Patient connected to face mask oxygen  Post-op Assessment: Report given to RN and Post -op Vital signs reviewed and stable  Post vital signs: Reviewed and stable  Last Vitals:  Vitals Value Taken Time  BP 123/64 01/13/24 10:55  Temp    Pulse 92 01/13/24 10:56  Resp 9 01/13/24 10:56  SpO2 99 % 01/13/24 10:56  Vitals shown include unfiled device data.  Last Pain:  Vitals:   01/13/24 0628  TempSrc: Temporal  PainSc: 0-No pain      Patients Stated Pain Goal: 3 (01/13/24 9371)  Complications: No notable events documented.

## 2024-01-13 NOTE — Op Note (Signed)
 Operative Note   DATE OF OPERATION: 8.26.2025  LOCATION: [facility]-outpatient  SURGICAL DIVISION: Plastic Surgery  PREOPERATIVE DIAGNOSES:  1. Left breast cancer overlapping sites ER+ 2. Macromastia 3. Chronic neck and back pain  POSTOPERATIVE DIAGNOSES:  same  PROCEDURE: 1. Removal right chest port 2. Left oncoplastic breast reconstruction right breast reduction  SURGEON: Earlis Ranks MD MBA  ASSISTANT: none  ANESTHESIA:  General.   EBL: 50 ml  COMPLICATIONS: None immediate.   INDICATIONS FOR PROCEDURE:  The patient, Kelsey Carlson, is a 58 y.o. female born on Dec 28, 1965, is here for staged breast reconstruction following left lumpectomy including resection NAC.    FINDINGS: Left reduction 175 g Right reduction 490 g.   DESCRIPTION OF PROCEDURE:  The patient was marked standing in the preoperative area to mark sternal notch, chest midline, anterior axillary lines, inframammary folds. The location of new nipple areolar complex was marked at level of on inframammary fold on anterior surface breast by palpation over right breast. With aid of Wise pattern marker, location of new nipple areolar complex and vertical limbs (8 cm) were marked by displacement of breasts along meridian. Over the left breast, NAC was absent. The cephalic extent of vertical limb resection marked symmetric with right. The vertical limb length marked at 11 cm. The lateral limbs were marked by displacement breast along meridian. The patient was taken to the operating room. SCDs were placed and IV antibiotics were given. The patient's operative site was prepped and draped in a sterile fashion. A time out was performed and all information was confirmed to be correct.     I began on right chest. Incision made in scar over port. Incision carried through superficial fascia and capsule. Port removed intact. Closure completed with 3-0 vicryl in superficial fascia and dermis. Skin closure completed with 4-0  monocryl subcuticular.  I then directed my attention to left breast. Over left breast, inferior pedicle marked. Pedicle deepithlialized and developed to chest wall. Pedicle developed to chest wall. Breast tissue resected over medial and lateral lower pole triangles. Medial and lateral flaps developed. Additional superior and lateral breast tissue excised. Breast tailor tacked closed.   I then directed attention to right breast where superior medial pedicle designed. NAC incised with 45 mm diameter marker. The pedicle was deepithelialized. Pedicle developed to chest wall. Breast tissue resected over lower pole. Medial and lateral flaps developed. Additional superior and lateral breast tissue excised. Breast tailor tacked closed. Patient brought to upright sitting position and assessed for symmetry. Patient returned to supine position. Breast cavities irrigated and hemostasis obtained. Local anesthetic infiltrated throughout each breast. 15 Fr JP placed in each breast and secured with 2-0 nylon. Closure completed bilateral with interrupted and short running 3-0 vicryl used to approximate dermis along remainder inframammary fold and vertical limb. NAC inset with 3-0 vicryl in dermis. Skin closure completed with 4-0 monocryl subcuticular throughout vertical limbs and NAC. Skin closure completed with 3-0 monocryl along each IMF. Tissue adhesive applied. Dry dressing and breast binder applied.   The patient was allowed to wake from anesthesia, extubated and taken to the recovery room in satisfactory condition.   SPECIMENS: right and left breast reduction  DRAINS: 15 Fr JP in right and left breast  Earlis Ranks, MD [facility] Plastic & Reconstructive Surgery  Office/ physician access line after hours [phone]

## 2024-01-13 NOTE — Anesthesia Procedure Notes (Signed)
 Procedure Name: Intubation Date/Time: 01/13/2024 7:39 AM  Performed by: Debarah Chiquita LABOR, CRNAPre-anesthesia Checklist: Patient identified, Emergency Drugs available, Suction available and Patient being monitored Patient Re-evaluated:Patient Re-evaluated prior to induction Oxygen Delivery Method: Circle system utilized Preoxygenation: Pre-oxygenation with 100% oxygen Induction Type: IV induction Ventilation: Mask ventilation without difficulty Laryngoscope Size: Mac and 3 Grade View: Grade I Tube type: Oral Tube size: 7.0 mm Number of attempts: 1 Airway Equipment and Method: Stylet, Oral airway and Bite block Placement Confirmation: ETT inserted through vocal cords under direct vision, positive ETCO2 and breath sounds checked- equal and bilateral Secured at: 20 cm Tube secured with: Tape Dental Injury: Teeth and Oropharynx as per pre-operative assessment

## 2024-01-13 NOTE — Discharge Instructions (Addendum)
 No Tylenol  until 12:40 p.m. No ibuprofen  until 12:40 p.m.  Post Anesthesia Home Care Instructions  Activity: Get plenty of rest for the remainder of the day. A responsible individual must stay with you for 24 hours following the procedure.  For the next 24 hours, DO NOT: -Drive a car -Advertising copywriter -Drink alcoholic beverages -Take any medication unless instructed by your physician -Make any legal decisions or sign important papers.  Meals: Start with liquid foods such as gelatin or soup. Progress to regular foods as tolerated. Avoid greasy, spicy, heavy foods. If nausea and/or vomiting occur, drink only clear liquids until the nausea and/or vomiting subsides. Call your physician if vomiting continues.  Special Instructions/Symptoms: Your throat may feel dry or sore from the anesthesia or the breathing tube placed in your throat during surgery. If this causes discomfort, gargle with warm salt water. The discomfort should disappear within 24 hours.  If you had a scopolamine patch placed behind your ear for the management of post- operative nausea and/or vomiting:  1. The medication in the patch is effective for 72 hours, after which it should be removed.  Wrap patch in a tissue and discard in the trash. Wash hands thoroughly with soap and water. 2. You may remove the patch earlier than 72 hours if you experience unpleasant side effects which may include dry mouth, dizziness or visual disturbances. 3. Avoid touching the patch. Wash your hands with soap and water after contact with the patch.       JP Drain [employer] this sheet to all of your post-operative appointments while you have your drains. Please measure your drains by CC's or ML's. Make sure you drain and measure your JP Drains 2 or 3 times per day. At the end of each day, add up totals for the left side and add up totals for the right side.    ( 9 am )     ( 3 pm )        ( 9 pm )                Date L  R  L  R  L  R   Total L/R

## 2024-01-13 NOTE — H&P (Signed)
 Subjective  Patient ID: Kelsey Carlson is a 58 y.o. female.  HPI  Presents for staged breast reconstruction. Presented with left breast nipple crusting and bleeding. Punch biopsy consistent with infiltrating breast cancer.   MRI demonstrated 2.8 cm biopsy-proven malignancy involving the LEFT nipple retroareolar. 3.5 x 3 x 5 cm enhancement within the left UOQ, located 2.5 cm posterosuperior to the biopsy-proven malignancy, total span 9.8 cm. No abnormal LN. Biopsy labeled left breast 2:30 showed invasive mammary carcinoma, ER/PR+, Her2-.  Staging scans negative for distant disease.   Oncotype 30. Completed neoadjuvant chemotherapy.  Final MRI demonstrated decreased irregular masslike enhancement with enhancement within the nipple and a 0.4 cm focus of enhancement in the lower outer retroareolar aspect. Interval debulking of patchy NME in the upper outer left breast measuring 3.5 x 2.0 x 5.1 cm.   Patient has undergone lumpectomy including resection left NAC with final pathology 1.5 cm IDC with DCIS margins clear, 0/4 SLN.   Mother with breast ca.   Current 43 DD/H. Reports several year history neck back and shoulder pain. Patient has tried PT course, specialty fitted bra, and OTC pain medication for over 6 month trial with no relief. She has had at least 5 prior benign excisions over left breast.  PMH significant for OSA. Reports quit vaping with nicotine 4 w ago.  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 Lives with spouse and adult brother who is mentally challenged. Kids in area to assist with post op care.  Review of Systems  Constitutional: Positive for fatigue.  Allergic/Immunologic: Positive for environmental allergies.  All other systems reviewed and are negative.  Objective  Physical Exam  Cardiovascular: Normal rate, regular rhythm and normal heart sounds.   Pulmonary/Chest Effort normal and breath sounds normal.   Lymph: no palpable  axillary adenopathy  Right chest port  +shoulder grooving Breasts: grade 3 ptosis bilateral Left nipple indurated red small crusting SN to nipple R 33.5 L 32.5 cm BW R 26 L 26 cm Nipple to IMF R 13 L1 3 cm  Assessment/Plan   Malignant neoplasm of overlapping sites of left breast in female, estrogen receptor positive  Neoadjuvant chemotherapy S/p left lumpectomy SLN  Needs to be off all nicotine products 4-6 w prior to surgery.  Plan oncoplastic breast reconstruction and removal right chest port.  Discussed oncoplastic reconstruction 7-10 d post lumpectomy to ensure pathologic clearance. Reviewed reduction with anchor type scars, drains, post operative visits and limitations, recovery. Diminished sensation nipple and breast skin, risk of nipple loss, wound healing problems, asymmetry. Discussed will have some contraction of breast volume and increased firmness with radiation, less ptosis with aging. This can result in asymmetries long term. Discussed changes with wt gain, loss, aging. Discussed lumpectomy alone can result in NAC displacement, distortion contour breast following lumpectomy and RT, asymmetry breast volume and NAC position. Reviewed purpose of this type reconstruction to prevent these. Reviewed breast lift or trying to correct NAC displacement post RT more difficult. Counseled I cannot assure her cup size. Reviewed can defer surgery until after therapies complete. In this setting would wait at least 6 months from end RT for any surgery. Reviewed increased risks complications in setting RT. Reviewed any complications from oncoplastic reconstruction procedure may delay start adjuvant therapies. Reviewed that this is not a permanent operation.  Additional risks including but not limited to bleeding, hematoma, seroma, infection, wound healing problems, asymmetry need for additional procedures, damage to adjacent structures, unacceptable cosmetic  result, blood clots in legs or lungs  reviewed. Completed ASPS consent for breast reduction.  Earlis Ranks, MD [facility] Plastic & Reconstructive Surgery  Office/ physician access line after hours [phone]

## 2024-01-13 NOTE — Anesthesia Postprocedure Evaluation (Signed)
 Anesthesia Post Note  Patient: Burnard Charlies Lis  Procedure(s) Performed: LEFT ONCOPLASTIC RECONSTRUCTION, RIGHT BREAST REDUCTION (Bilateral: Breast) REMOVAL PORT-A-CATH (Right: Chest)     Patient location during evaluation: PACU Anesthesia Type: General Level of consciousness: awake and alert Pain management: pain level controlled Vital Signs Assessment: post-procedure vital signs reviewed and stable Respiratory status: spontaneous breathing, nonlabored ventilation, respiratory function stable and patient connected to nasal cannula oxygen Cardiovascular status: blood pressure returned to baseline and stable Postop Assessment: no apparent nausea or vomiting Anesthetic complications: no   No notable events documented.  Last Vitals:  Vitals:   01/13/24 1130 01/13/24 1145  BP:  123/69  Pulse:  93  Resp:  (!) 7  Temp: 36.8 C   SpO2:  95%    Last Pain:  Vitals:   01/13/24 1130  TempSrc:   PainSc: 4                  Rome Ade

## 2024-01-13 NOTE — Anesthesia Preprocedure Evaluation (Signed)
 Anesthesia Evaluation  Patient identified by MRN, date of birth, ID band Patient awake    Reviewed: Allergy & Precautions, NPO status , Patient's Chart, lab work & pertinent test results  History of Anesthesia Complications Negative for: history of anesthetic complications  Airway Mallampati: II  TM Distance: >3 FB Neck ROM: Full    Dental no notable dental hx. (+) Teeth Intact   Pulmonary sleep apnea , neg COPD, Patient abstained from smoking.Not current smoker, former smoker   Pulmonary exam normal breath sounds clear to auscultation       Cardiovascular Exercise Tolerance: Good METS(-) hypertension(-) CAD and (-) Past MI negative cardio ROS (-) dysrhythmias  Rhythm:Regular Rate:Normal - Systolic murmurs    Neuro/Psych  PSYCHIATRIC DISORDERS  Depression    negative neurological ROS     GI/Hepatic Neg liver ROS,GERD  Controlled,,  Endo/Other  negative endocrine ROSneg diabetes    Renal/GU negative Renal ROS  negative genitourinary   Musculoskeletal  (+) Arthritis , Osteoarthritis,  Breast Ca   Abdominal  (+) + obese  Peds  Hematology Lab Results      Component                Value               Date                      WBC                      4.5                 12/01/2023                HGB                      10.6 (L)            12/01/2023                HCT                      31.5 (L)            12/01/2023                MCV                      95.5                12/01/2023                PLT                      330                 12/01/2023              Anesthesia Other Findings Past Medical History: No date: Arthritis     Comment:  left hip 07/2023: Cancer (HCC)     Comment:  left breast IMC No date: Gallstones No date: GERD (gastroesophageal reflux disease) No date: History of kidney stones 06/28/2013: Lumbar back pain No date: Sleep apnea     Comment:  does not use CPAP No date:  Urolithiasis No date: Wears glasses  Reproductive/Obstetrics  Anesthesia Physical Anesthesia Plan  ASA: 3  Anesthesia Plan: General and Regional   Post-op Pain Management: Tylenol  PO (pre-op)*, Celebrex  PO (pre-op)* and Gabapentin  PO (pre-op)*   Induction: Intravenous  PONV Risk Score and Plan: 3 and Ondansetron , Dexamethasone , Treatment may vary due to age or medical condition and Midazolam   Airway Management Planned: LMA  Additional Equipment: None  Intra-op Plan:   Post-operative Plan: Extubation in OR  Informed Consent: I have reviewed the patients History and Physical, chart, labs and discussed the procedure including the risks, benefits and alternatives for the proposed anesthesia with the patient or authorized representative who has indicated his/her understanding and acceptance.     Dental advisory given  Plan Discussed with: CRNA  Anesthesia Plan Comments: (Discussed risks of anesthesia with patient, including PONV, sore throat, lip/dental/eye damage. Rare risks discussed as well, such as cardiorespiratory and neurological sequelae, and allergic reactions. Discussed the role of CRNA in patient's perioperative care. Patient understands.)         Anesthesia Quick Evaluation

## 2024-01-14 ENCOUNTER — Encounter (HOSPITAL_BASED_OUTPATIENT_CLINIC_OR_DEPARTMENT_OTHER): Payer: Self-pay | Admitting: Plastic Surgery

## 2024-01-14 ENCOUNTER — Other Ambulatory Visit: Payer: Self-pay

## 2024-01-15 LAB — SURGICAL PATHOLOGY

## 2024-01-21 NOTE — Progress Notes (Addendum)
 Subjective Patient ID: Kelsey Carlson is a 58 y.o. female.   HPI  1 week post op. Pathology reduction benign.  Presented with left breast nipple crusting and bleeding. Punch biopsy consistent with infiltrating breast cancer.   MRI demonstrated 2.8 cm biopsy-proven malignancy involving the LEFT nipple retroareolar. 3.5 x 3 x 5 cm enhancement within the left UOQ, located 2.5 cm posterosuperior to the biopsy-proven malignancy, total span 9.8 cm. No abnormal LN. Biopsy labeled left breast 2:30 showed invasive mammary carcinoma, ER/PR+, Her2-.  Staging scans negative for distant disease.   Oncotype 30. Completed neoadjuvant chemotherapy.  Final MRI demonstrated decreased irregular masslike enhancement with enhancement within the nipple and a 0.4 cm focus of enhancement in the lower outer retroareolar aspect. Interval debulking of patchy NME in the upper outer left breast measuring 3.5 x 2.0 x 5.1 cm.   Patient has undergone lumpectomy including resection left NAC with final pathology 1.5 cm IDC with DCIS margins clear, 0/4 SLN.   Mother with breast ca.   Prior 43 DD/H. She has had at least 5 prior benign excisions over left breast. Left reduction 175 g Right reduction 490 g. No weight recorded lumpectomy specimens.  PMH significant for OSA. Reports quit vaping with nicotine month prior to surgery.  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 Lives with spouse and adult brother who is mentally challenged. Kids in area to assist with post op care.  Review of Systems  Objective Physical Exam  Breasts: incisions intact NACs viable  Assessment/Plan  Malignant neoplasm of overlapping sites of left breast in female, estrogen receptor positive  Neoadjuvant chemotherapy S/p left lumpectomy including resection NAC, SLN S/p removal port, right breast reduction left oncoplastic reduction  Appt with Rad Onc 9.23.25, Dr. Gudena 9.10.25, Dr. Vanderbilt 9.12.25  Copy  of pathology provided. Continue compression light activities through next visit. Ok to remove compression for sleeping. Ok to walk for exercise. Reviewed common to develop open area T junctions, if occurs apply vaseline or Aquaphor and dry dressing. Patient pleased with early result. Ok to use lotion over chest for dry skin.  F/u 2-3 w

## 2024-01-28 ENCOUNTER — Inpatient Hospital Stay: Attending: Hematology and Oncology | Admitting: Hematology and Oncology

## 2024-01-28 VITALS — BP 119/64 | HR 83 | Temp 98.3°F | Resp 17 | Ht 60.0 in | Wt 172.4 lb

## 2024-01-28 DIAGNOSIS — Z79899 Other long term (current) drug therapy: Secondary | ICD-10-CM | POA: Diagnosis not present

## 2024-01-28 DIAGNOSIS — Z1721 Progesterone receptor positive status: Secondary | ICD-10-CM | POA: Diagnosis not present

## 2024-01-28 DIAGNOSIS — G620 Drug-induced polyneuropathy: Secondary | ICD-10-CM | POA: Diagnosis not present

## 2024-01-28 DIAGNOSIS — C50812 Malignant neoplasm of overlapping sites of left female breast: Secondary | ICD-10-CM | POA: Diagnosis not present

## 2024-01-28 DIAGNOSIS — Z1732 Human epidermal growth factor receptor 2 negative status: Secondary | ICD-10-CM | POA: Diagnosis not present

## 2024-01-28 DIAGNOSIS — N200 Calculus of kidney: Secondary | ICD-10-CM | POA: Diagnosis not present

## 2024-01-28 DIAGNOSIS — T451X5A Adverse effect of antineoplastic and immunosuppressive drugs, initial encounter: Secondary | ICD-10-CM | POA: Diagnosis not present

## 2024-01-28 DIAGNOSIS — Z170 Estrogen receptor positive status [ER+]: Secondary | ICD-10-CM | POA: Diagnosis not present

## 2024-01-28 MED ORDER — TRIAMCINOLONE ACETONIDE 0.5 % EX OINT: 1.0000 | TOPICAL_OINTMENT | Freq: Two times a day (BID) | CUTANEOUS | 0 refills | Status: AC

## 2024-01-28 NOTE — Assessment & Plan Note (Signed)
 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 06/18/2023: Left breast biopsy 2:30 position: Grade 2 IDC with DCIS ER 75%, PR 75%, Ki67 20%, HER2 1+ negative   Treatment plan: Biopsy on the 5 cm of enhancement: Grade 2 IDC, ER 75%, PR 75%, Ki 67: 20%, HER 2 1+ Neg CT scans and bone scan: 06/18/23:Left breast mass 2.2 cm, prom axillary LN, no distant mets, non specific pancreatic head/ uncinate mass 07/02/2023: Oncotype DX: 30 (distant recurrence at 9 years: 19%) Neoadjuvant chemotherapy with dose dense Adriamycin  and Cytoxan  x 4 followed by Taxol  x 12 started 07/21/2023 01/06/2024: Left lumpectomy: Grade 2 IDC 1.5 cm with DCIS, margins negative, 0/4 lymph nodes negative, ER 75%, PR 75%, Ki-67 20%, HER2 negative  01/13/2024: Bilateral mammoplasty reduction: Benign adjuvant radiation Adjuvant antiestrogen therapy ------------------------------------------------------------------------------------------------------------------------------------ 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  We would probably do mammograms alternating with breast MRIs for the first 2 years and then switch to contrast-enhanced mammograms after that.  We would also likely be doing guardant reveal.

## 2024-01-28 NOTE — Progress Notes (Signed)
 Patient Care Team: Avelina Greig BRAVO, MD as PCP - General Tyree Nanetta SAILOR, RN as Oncology Nurse Navigator Odean Potts, MD as Consulting Physician (Hematology and Oncology) Vanderbilt Ned, MD as Consulting Physician (General Surgery) Dewey Rush, MD as Consulting Physician (Radiation Oncology)  DIAGNOSIS:  Encounter Diagnosis  Name Primary?   Malignant neoplasm of overlapping sites of left breast in female, estrogen receptor positive (HCC) Yes    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)  05/23/2023 Initial Biopsy   Left nipple punch biopsy: Infiltrating carcinoma consistent with breast origin (CK7 GATA3 and ER positive) ER 30%, PR 20%, HER2 1+ (send T4, N0, M0, stage IIIb)    06/02/2023 Breast MRI   Breast MRI: 2.8 cm biopsy-proven malignancy left nipple retroareolar left breast, 5 cm enhancement UOQ left breast posterior superior to the biopsy-proven malignancy, highly suspicious left breast enhancement spanning 9.8 cm.    06/09/2023 Initial Diagnosis   Malignant neoplasm of overlapping sites of left female breast (HCC)   06/10/2023 Cancer Staging   Staging form: Breast, AJCC 8th Edition - Clinical: cT4, cN0, cM0, GX, ER+, PR+, HER2- - Signed by Odean Potts, MD on 06/10/2023 Stage prefix: Initial diagnosis Histologic grading system: 3 grade system   06/18/2023 Initial Biopsy   Left breast biopsy 2:30 position: Grade 2 IDC with DCIS ER 75%, PR 75%, Ki67 20%, HER2 1+ negative   06/23/2023 Imaging   Staging Scans: CT C/A/P :IMPRESSION: 1. Asymmetric masslike breast tissue in the lateral left breast measures 2.2 x 1.2 cm, compatible with known primary breast malignancy. 2. Prominent left axillary lymph nodes measure up to 5 mm in short axis, nonspecific consider attention on dedicated ultrasound. 3. No  convincing evidence of metastatic disease in the chest, abdomen or pelvis. 4. Ill-defined hypodensity in the  pancreatic head/uncinate process, nonspecific but favored to reflect fatty infiltration consider attention on follow-up imaging versus further evaluation by pre and postcontrast enhanced MRC. 5. Nonobstructive 3  mm right interpolar renal stone.  BONE SCAN:  IMPRESSION: 1. No evidence for osseous metastatic disease 2. Degenerative uptake at the left hip, feet and ankles.    07/21/2023 -  Neo-Adjuvant Chemotherapy   Neoadjuvant chemotherapy with dose dense Adriamycin  and Cytoxan  x 4 followed by Taxol  x 12 started 07/21/2023      CHIEF COMPLIANT: Follow-up to discuss results of surgery  HISTORY OF PRESENT ILLNESS:   History of Present Illness Kelsey Carlson is a 58 year old female with breast cancer who presents for follow-up after surgery and to discuss ongoing treatment.  She underwent a lumpectomy with removal of a 1.5 cm grade 2 tumor, with negative margins and no lymphovascular invasion. A second surgery confirmed benign findings. Four lymph nodes were removed, all negative for cancer. She experiences tightness and cording in her arm, worsened by arm movements for radiation therapy preparation. She is preparing for radiation therapy and will resume hormone therapy after its completion.     ALLERGIES:  is allergic to chlorhexidine .  MEDICATIONS:  Current Outpatient Medications  Medication Sig Dispense Refill   albuterol  (VENTOLIN  HFA) 108 (90 Base) MCG/ACT inhaler Inhale 2 puffs into the lungs every 6 (six) hours as needed for wheezing or shortness of breath. 8 g 2   ibuprofen  (ADVIL ) 800 MG tablet Take 1 tablet (800 mg total) by mouth every 8 (eight) hours as needed. 30 tablet 0   metroNIDAZOLE  (METROCREAM ) 0.75 % cream Apply to face one  to two times a day for rosacea. 45 g 11   naproxen  sodium (ALEVE ) 220 MG tablet Take 220 mg by mouth.     oxyCODONE  (OXY IR/ROXICODONE ) 5 MG immediate release tablet Take 1 tablet (5 mg total) by mouth every 6 (six) hours as needed for severe pain  (pain score 7-10). 15 tablet 0   pantoprazole  (PROTONIX ) 40 MG tablet TAKE 1 TABLET BY MOUTH EVERY DAY 90 tablet 1   triamcinolone  ointment (KENALOG ) 0.5 % Apply 1 Application topically 2 (two) times daily. 30 g 0   No current facility-administered medications for this visit.    PHYSICAL EXAMINATION: ECOG PERFORMANCE STATUS: 1 - Symptomatic but completely ambulatory  Vitals:   01/28/24 1045  BP: 119/64  Pulse: 83  Resp: 17  Temp: 98.3 F (36.8 C)  SpO2: 100%   Filed Weights   01/28/24 1045  Weight: 172 lb 6.4 oz (78.2 kg)    Physical Exam cording under the left axilla  (exam performed in the presence of a chaperone)  LABORATORY DATA:  I have reviewed the data as listed    Latest Ref Rng & Units 12/01/2023   10:29 AM 11/24/2023   11:06 AM 11/17/2023    9:32 AM  CMP  Glucose 70 - 99 mg/dL 896  80  887   BUN 6 - 20 mg/dL 10  7  10    Creatinine 0.44 - 1.00 mg/dL 9.35  9.38  9.23   Sodium 135 - 145 mmol/L 141  141  137   Potassium 3.5 - 5.1 mmol/L 3.9  3.9  3.5   Chloride 98 - 111 mmol/L 110  109  105   CO2 22 - 32 mmol/L 27  27  26    Calcium 8.9 - 10.3 mg/dL 9.2  9.3  9.2   Total Protein 6.5 - 8.1 g/dL 5.7  5.9  6.2   Total Bilirubin 0.0 - 1.2 mg/dL 0.3  0.4  0.6   Alkaline Phos 38 - 126 U/L 61  58  65   AST 15 - 41 U/L 18  18  18    ALT 0 - 44 U/L 22  20  22      Lab Results  Component Value Date   WBC 4.5 12/01/2023   HGB 10.6 (L) 12/01/2023   HCT 31.5 (L) 12/01/2023   MCV 95.5 12/01/2023   PLT 330 12/01/2023   NEUTROABS 2.6 12/01/2023    ASSESSMENT & PLAN:  Malignant neoplasm of overlapping sites of left female breast (HCC) 05/23/2023: Left nipple punch biopsy: Infiltrating carcinoma consistent with breast origin (CK7 GATA3 and ER positive) ER 30%, PR 20%, HER2 1+ (send T4, N0, M0, stage IIIb) 06/02/2023: Breast MRI: 2.8 cm biopsy-proven malignancy left nipple retroareolar left breast, 5 cm enhancement UOQ left breast posterior superior to the biopsy-proven  malignancy, highly suspicious left breast enhancement spanning 9.8 cm. 06/18/2023: Left breast biopsy 2:30 position: Grade 2 IDC with DCIS ER 75%, PR 75%, Ki67 20%, HER2 1+ negative   Treatment plan: Biopsy on the 5 cm of enhancement: Grade 2 IDC, ER 75%, PR 75%, Ki 67: 20%, HER 2 1+ Neg CT scans and bone scan: 06/18/23:Left breast mass 2.2 cm, prom axillary LN, no distant mets, non specific pancreatic head/ uncinate mass 07/02/2023: Oncotype DX: 30 (distant recurrence at 9 years: 19%) Neoadjuvant chemotherapy with dose dense Adriamycin  and Cytoxan  x 4 followed by Taxol  x 12 started 07/21/2023 01/06/2024: Left lumpectomy: Grade 2 IDC 1.5 cm with DCIS, margins negative, 0/4 lymph nodes  negative, ER 75%, PR 75%, Ki-67 20%, HER2 negative  01/13/2024: Bilateral mammoplasty reduction: Benign adjuvant radiation Adjuvant antiestrogen therapy ------------------------------------------------------------------------------------------------------------------------------------ 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  We would probably do mammograms alternating with breast MRIs for the first 2 years and then switch to contrast-enhanced mammograms after that.  We would also likely be doing guardant reveal.  Left axillary cording: I referred her to physical therapy. Rash between her breasts: Send a prescription for triamcinolone  ointment ------------------------------------- Assessment and Plan Assessment & Plan Estrogen receptor positive left breast cancer status post-lumpectomy Status post-lumpectomy with 1.5 cm residual tumor, grade 2. Negative margins, no lymphovascular invasion, ER positive. Four negative lymph nodes. - Proceed with radiation therapy. - Discuss hormone therapy post-radiation. - Schedule follow-up post-radiation for hormone therapy  discussion.  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 - Refer to physical therapy for cording management.        Orders Placed This Encounter  Procedures   Ambulatory referral to Physical Therapy    Referral Priority:   Routine    Referral Type:   Physical Medicine    Referral Reason:   Specialty Services Required    Requested Specialty:   Physical Therapy    Number of Visits Requested:   1   The patient has a good understanding of the overall plan. she agrees with it. she will call with any problems that may develop before the next visit here. Total time spent: 30 mins including face to face time and time spent for planning, charting and co-ordination of care   Naomi MARLA Chad, MD 01/28/24

## 2024-01-28 NOTE — Therapy (Unsigned)
 OUTPATIENT PT/OT UPPER EXTREMITY LYMPHEDEMA EVALUATION  Patient Name: Kelsey Carlson MRN: [id number] DOB:1966/04/23, 58 y.o., female Today's Date: 01/29/2024  END OF SESSION:  PT End of Session - 01/29/24 1107     Visit Number 1    Number of Visits 13    Date for PT Re-Evaluation 02/26/24    PT Start Time 1105    PT Stop Time 1210    PT Time Calculation (min) 65 min    Activity Tolerance Patient tolerated treatment well    Behavior During Therapy [facility] for tasks assessed/performed          Past Medical History:  Diagnosis Date   Arthritis    left hip   Cancer (HCC) 07/2023   left breast [facility]   Gallstones    GERD (gastroesophageal reflux disease)    History of kidney stones    Lumbar back pain 06/28/2013   Sleep apnea    does not use CPAP   Urolithiasis    Wears glasses    Past Surgical History:  Procedure Laterality Date   ANKLE ARTHRODESIS  1981   left ankle bone tumor-   BLADDER SUSPENSION  2008   sling-cysto   breast biopsy  2002   lt br bx   BREAST BIOPSY  01/02/2024   MM LT RADIOACTIVE SEED EA ADD LESION LOC MAMMO GUIDE 01/02/2024 [facility]   BREAST BIOPSY  01/02/2024   MM LT RADIOACTIVE SEED LOC MAMMO GUIDE 01/02/2024 [facility]   BREAST CYST EXCISION Left 02/19/2013   Procedure:  EXCISION BREAST MASS;  Surgeon: Debby A. Cornett, MD;  Location: [facility];  Service: General;  Laterality: Left;   BREAST EXCISIONAL BIOPSY Left 2014   benign   BREAST LUMPECTOMY WITH RADIOACTIVE SEED AND SENTINEL LYMPH NODE BIOPSY Left 01/06/2024   Procedure: BREAST LUMPECTOMY WITH RADIOACTIVE SEED AND SENTINEL LYMPH NODE BIOPSY;  Surgeon: Vanderbilt Debby, MD;  Location: [facility];  Service: General;  Laterality: Left;  BRACKETED LEFT BREAST SEED LUMPECTOMY LEFT SENTINEL LYMPH NODE MAPPING   BREAST REDUCTION SURGERY Bilateral 01/13/2024   Procedure: LEFT ONCOPLASTIC RECONSTRUCTION, RIGHT BREAST REDUCTION;  Surgeon: Arelia Filippo, MD;  Location: [facility];  Service: Plastics;  Laterality: Bilateral;   BREAST SURGERY     CHOLECYSTECTOMY N/A 08/27/2017   Procedure: LAPAROSCOPIC CHOLECYSTECTOMY WITH INTRAOPERATIVE CHOLANGIOGRAM;  Surgeon: Curvin Deward MOULD, MD;  Location: [facility];  Service: General;  Laterality: N/A;   DILATION AND CURETTAGE OF UTERUS     EXCISION OF BREAST BIOPSY Left 01/06/2024   Procedure: EXCISION OF BREAST BIOPSY;  Surgeon: Vanderbilt Debby, MD;  Location: [facility];  Service: General;  Laterality: Left;  LEFT NIPPLE BIOPSY   IR IMAGING GUIDED PORT INSERTION  07/15/2023   LUMBAR DISC SURGERY  1997   lumb lam   PORT-A-CATH REMOVAL Right 01/13/2024   Procedure: REMOVAL PORT-A-CATH;  Surgeon: Arelia Filippo, MD;  Location: [facility];  Service: Plastics;  Laterality: Right;   TUBAL LIGATION     Essure   Patient Active Problem List   Diagnosis Date Noted   Port-A-Cath in place 07/21/2023   Malignant neoplasm of overlapping sites of left female breast (HCC) 06/09/2023   Bilateral hearing loss 12/24/2022   Tinnitus of both ears 06/09/2020   Acute gastritis 08/03/2019   Chronic left hip pain 12/02/2017   Chronic back pain 12/02/2017   Class 1 obesity due to excess calories with body mass index (BMI) of 34.0 to  34.9 in adult 12/08/2014   Varicose veins of both legs with edema 12/08/2014   Insomnia 02/18/2012   MDD (recurrent major depressive disorder) in remission (HCC) 01/16/2012   TRANSAMINASES, SERUM, ELEVATED 05/27/2008   NEPHROLITHIASIS, HX OF 05/23/2008   Sleep apnea 05/30/2007   NECK PAIN, CHRONIC 02/25/2007   Chest pain 02/25/2007    REFERRING PROVIDER: Dr. Mackey Chad  REFERRING DIAG: Left axillary cording  THERAPY DIAG:  Stiffness of left shoulder, not elsewhere classified  Malignant neoplasm of overlapping sites of left breast in female, estrogen receptor positive (HCC)  Acute pain of left shoulder  Aftercare following surgery  for neoplasm  ONSET DATE: 01/26/2024  Rationale for Evaluation and Treatment: Rehabilitation  SUBJECTIVE                                                                                                                                                                                           SUBJECTIVE STATEMENT: Pt reports she noticed when she tried to lift her arm on 01/26/2024 to prepare for radiation, she has pain and it feels like there's a string in her armpit leading down her lateral trunk. She also c/o the area feeling hot and being swollen.  PERTINENT HISTORY: Patient was diagnosed in January 2025 with left grade 2 invasive ductal carcinoma breast cancer. It was ER/PR positive, HER2 negative with a Ki67 of 20%. She underwent neoadjuvant chemotherapy followed by a left lumpectomy and sentinel node biopsy (4 negative nodes) on 01/06/2024. She had a bilateral reduction and lift on 01/13/2024. She is soon to begin radiation and reports axillary cording.  PAIN:  Are you having pain? Yes NPRS scale: 7/10 Pain location: Left axilla and lateral left trunk Pain orientation: Left  PAIN TYPE: tight Pain description: intermittent  Aggravating factors: trying to reach overhead Relieving factors: resting and advil   PRECAUTIONS: Other: Left arm lymphedema risk and recently completed chemotherapy  RED FLAGS: None   WEIGHT BEARING RESTRICTIONS: No  FALLS:  Has patient fallen in last 6 months? 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  COGNITION:  Overall cognitive status: Within functional limits for tasks assessed   PALPATION: Very  tender to palpation left lateral breast with warmth to touch, edema present, and redness (see photo).  OBSERVATIONS / OTHER ASSESSMENTS:  Left axillary cording visible and palpable. She reports feeling it into her lateral trunk but due to significant edema present today, PT is unable to see cording in that area.    POSTURE: Forward head and rounded shoulders posture  HAND DOMINANCE: Right  UPPER EXTREMITY AROM/PROM:  A/PROM RIGHT   eval   Shoulder extension 43  Shoulder flexion 125  Shoulder abduction 150  Shoulder internal rotation 58  Shoulder external rotation 87    (Blank rows = not tested)  A/PROM LEFT   eval  Shoulder extension 37  Shoulder flexion 96  Shoulder abduction 97  Shoulder internal rotation 65  Shoulder external rotation 72    (Blank rows = not tested)  UPPER EXTREMITY STRENGTH: WFL but left not tested due to pain   LYMPHEDEMA ASSESSMENTS:   SURGERY TYPE/DATE: Left lumpectomy and sentinel node biopsy 01/06/2024; breast reduction 01/13/2024  NUMBER OF LYMPH NODES REMOVED: 4  CHEMOTHERAPY: Neoadjuvant completed 12/01/2023  RADIATION:Upcoming  HORMONE TREATMENT: Upcoming  INFECTIONS: none  LYMPHEDEMA ASSESSMENTS:   LANDMARK RIGHT eval  Around trunk at axillary fold with arms by side   Around trunk 4 below sternal notch   10 cm proximal to olecranon process 28.2  Olecranon process 25  10 cm proximal to ulnar styloid process 22.2  Just proximal to ulnar styloid process 15.3  Across hand at thumb web space 18.5  At base of 2nd digit 6.1  (Blank rows = not tested)  LANDMARK LEFT  eval  Around trunk at axillary fold with arms by side   Around trunk 4 below sternal notch   10 cm proximal to olecranon process 28  Olecranon process 24.3  10 cm proximal to ulnar styloid process 21.4  Just  proximal to ulnar styloid process 15  Across hand at thumb web space 18.5  At base of 2nd digit 5.9  (Blank rows = not tested)   Junie Palin - 01/29/24 0001     Open a tight or new jar Mild difficulty    Do heavy household chores (wash walls, wash floors) Mild difficulty    Carry a shopping bag or briefcase Mild difficulty    Wash your back Mild difficulty    Use a knife to cut food Mild difficulty    Recreational activities in which you take some force or impact through your arm, shoulder, or hand (golf, hammering, tennis) Mild difficulty    During the past week, to what extent has your arm, shoulder or hand problem interfered with your normal social activities with family, friends, neighbors, or groups? Not at all    During the past week, to what extent has your arm, shoulder or hand problem limited your work or other regular daily activities Slightly    Arm, shoulder, or hand pain. Mild    Tingling (pins and needles) in your arm, shoulder, or hand Mild    Difficulty Sleeping Moderate difficulty    DASH Score 25 %  TREATMENT DATE: Educated pt on HEP to restore shoulder ROM    PATIENT EDUCATION:  Education details: Lymphedema precautions Person educated: Patient Education method: Programmer, multimedia, Demonstration, Verbal cues, and Handouts Education comprehension: verbalized understanding and returned demonstration  HOME EXERCISE PROGRAM: Active assist and active shoulder ROM: flexion, abduction, ER, and scapular retraction  ASSESSMENT:  CLINICAL IMPRESSION: Patient is a 58 y.o. woman who was seen today for physical therapy evaluation and treatment for left axillary cording s/p left breast cancer surgery. She underwent a left lumpectomy and sentinel node biopsy (4 negative nodes) on 01/06/2024 followed by a bilateral breast reduction and lift on 01/13/2024.  There is axillary cording present but also edema, redness, and pain with light palpation indicating possible concern for infection. Phoned surgeon's office to let them know. The nurse stated no one could see her today but that she already had an appointment tomorrow. Because the pt reports her pain is 50% worse than yesterday, PT assisted pt with getting seen by the [facility] symptom management clinic today per her medical oncologist's request. Results of that visit unknown at time of this documentation. She will benefit from PT to improve shoulder ROM and decrease pain to prepare her for radiation positioning.  OBJECTIVE IMPAIRMENTS: decreased ROM, increased edema, increased fascial restrictions, impaired UE functional use, postural dysfunction, and pain.   ACTIVITY LIMITATIONS: carrying and reach over head  PARTICIPATION LIMITATIONS: cleaning, laundry, and community activity  PERSONAL FACTORS: Time since onset of injury/illness/exacerbation are also affecting patient's functional outcome.   REHAB POTENTIAL: Excellent  CLINICAL DECISION MAKING: Stable/uncomplicated  EVALUATION COMPLEXITY: Low  GOALS: Goals reviewed with patient? Yes  SHORT TERM GOALS = LONG TERM GOALS: Target date: 02/26/2024    Patient will be independent with her home exercise program for improving shoulder ROM and function. Goal status: INITIAL  2.  Patient will report >/= 50% reduction in left lateral breast and axillary pain to better tolerate daily activities. Goal status: INITIAL  3.  Patient will improve her DASH score to </= 8 for overall improved UE function   Goal status: INITIAL  4.  Patient will increase left shoulder flexion and abduction to >/= 120 degrees for increased ability to reach overhead. Goal status: INITIAL  5.  Patient will verbalize good understanding of lymphedema risk reduction practices. Goal status: INITIAL   PLAN:  PT FREQUENCY: 3x/week  PT DURATION: 4 weeks  PLANNED  INTERVENTIONS: 02835- PT Re-evaluation, 97110-Therapeutic exercises, 97530- Therapeutic activity, [id number]- Neuromuscular re-education, 97535- Self Care, 02859- Manual therapy, 20560 (1-2 muscles), 20561 (3+ muscles)- Dry Needling, Patient/Family education, Manual lymph drainage, and Scar mobilization  PLAN FOR NEXT SESSION: SOZO next visit; held today due to swelling; begin PROM and manual techniques to improve cording. Pt was encouraged to ask her surgeon tomorrow if she is ok to begin PT if she does have an infection and she will let us  know.   Eward Wonda Sharps, [place] 01/29/24 1:50 PM

## 2024-01-29 ENCOUNTER — Encounter: Payer: Self-pay | Admitting: Physical Therapy

## 2024-01-29 ENCOUNTER — Ambulatory Visit: Attending: Hematology and Oncology | Admitting: Physical Therapy

## 2024-01-29 ENCOUNTER — Inpatient Hospital Stay (HOSPITAL_BASED_OUTPATIENT_CLINIC_OR_DEPARTMENT_OTHER): Admitting: Physician Assistant

## 2024-01-29 ENCOUNTER — Other Ambulatory Visit: Payer: Self-pay

## 2024-01-29 ENCOUNTER — Telehealth: Payer: Self-pay | Admitting: *Deleted

## 2024-01-29 VITALS — BP 114/62 | HR 84 | Temp 98.2°F | Resp 20 | Wt 172.4 lb

## 2024-01-29 DIAGNOSIS — Z9221 Personal history of antineoplastic chemotherapy: Secondary | ICD-10-CM | POA: Diagnosis not present

## 2024-01-29 DIAGNOSIS — M25612 Stiffness of left shoulder, not elsewhere classified: Secondary | ICD-10-CM

## 2024-01-29 DIAGNOSIS — R600 Localized edema: Secondary | ICD-10-CM | POA: Insufficient documentation

## 2024-01-29 DIAGNOSIS — M25512 Pain in left shoulder: Secondary | ICD-10-CM | POA: Insufficient documentation

## 2024-01-29 DIAGNOSIS — Z170 Estrogen receptor positive status [ER+]: Secondary | ICD-10-CM

## 2024-01-29 DIAGNOSIS — Z483 Aftercare following surgery for neoplasm: Secondary | ICD-10-CM | POA: Insufficient documentation

## 2024-01-29 DIAGNOSIS — N644 Mastodynia: Secondary | ICD-10-CM | POA: Diagnosis not present

## 2024-01-29 DIAGNOSIS — C50812 Malignant neoplasm of overlapping sites of left female breast: Secondary | ICD-10-CM | POA: Diagnosis not present

## 2024-01-29 NOTE — Progress Notes (Unsigned)
 Symptom Management Consult Note [facility]    Patient Care Team: Avelina Greig BRAVO, MD as PCP - General Tyree Nanetta SAILOR, RN as Oncology Nurse Navigator Odean Potts, MD as Consulting Physician (Hematology and Oncology) Vanderbilt Ned, MD as Consulting Physician (General Surgery) Dewey Rush, MD as Consulting Physician (Radiation Oncology)    Name / MRN / DOB: Kelsey Carlson  [id number]  November 05, 1965   Date of visit: 01/29/2024   Chief Complaint/Reason for visit: left breast pain      ASSESSMENT AND PLAN Patient is a 58 y.o. female with oncologic history of malignant neoplasm of overlapping sites of left breast in female, estrogen receptor positive followed by Dr. Odean.  I have viewed most recent oncology note and lab work.  #Malignant neoplasm of overlapping sites of left breast in female, estrogen receptor positive  - Completed neoadjuvant chemotherapy with adriamycin  and cytoxan  followed by taxol  09/01/23 - Has radiation oncology appointment 02/10/24   #Left breast pain - Patient afebrile. Left breast with erythema and diffuse tenderness. Incisions appear well healed, no palpable fluid collection. Picture under media from PT today. - Per patient surgeon's office called in an antibiotic for her PTA. She has follow up with surgeon's office tomorrow for post op evaluation. - Offered to recheck patient in 1 week to ensure symptoms have resolved however will wait to see plan per surgery visit tomorrow.    Strict ED precautions discussed should symptoms worsen.   HEME/ONC HISTORY Oncology History  Malignant neoplasm of overlapping sites of left female breast (HCC)  05/23/2023 Initial Biopsy   Left nipple punch biopsy: Infiltrating carcinoma consistent with breast origin (CK7 GATA3 and ER positive) ER 30%, PR 20%, HER2 1+ (send T4, N0, M0, stage IIIb)    06/02/2023 Breast MRI   Breast MRI: 2.8 cm biopsy-proven malignancy left nipple retroareolar left  breast, 5 cm enhancement UOQ left breast posterior superior to the biopsy-proven malignancy, highly suspicious left breast enhancement spanning 9.8 cm.    06/09/2023 Initial Diagnosis   Malignant neoplasm of overlapping sites of left female breast (HCC)   06/10/2023 Cancer Staging   Staging form: Breast, AJCC 8th Edition - Clinical: cT4, cN0, cM0, GX, ER+, PR+, HER2- - Signed by Odean Potts, MD on 06/10/2023 Stage prefix: Initial diagnosis Histologic grading system: 3 grade system   06/18/2023 Initial Biopsy   Left breast biopsy 2:30 position: Grade 2 IDC with DCIS ER 75%, PR 75%, Ki67 20%, HER2 1+ negative   06/23/2023 Imaging   Staging Scans: CT C/A/P :IMPRESSION: 1. Asymmetric masslike breast tissue in the lateral left breast measures 2.2 x 1.2 cm, compatible with known primary breast malignancy. 2. Prominent left axillary lymph nodes measure up to 5 mm in short axis, nonspecific consider attention on dedicated ultrasound. 3. No  convincing evidence of metastatic disease in the chest, abdomen or pelvis. 4. Ill-defined hypodensity in the pancreatic head/uncinate process, nonspecific but favored to reflect fatty infiltration consider attention on follow-up imaging versus further evaluation by pre and postcontrast enhanced MRC. 5. Nonobstructive 3  mm right interpolar renal stone.  BONE SCAN:  IMPRESSION: 1. No evidence for osseous metastatic disease 2. Degenerative uptake at the left hip, feet and ankles.    07/21/2023 -  Neo-Adjuvant Chemotherapy   Neoadjuvant chemotherapy with dose dense Adriamycin  and Cytoxan  x 4 followed by Taxol  x 12 started 07/21/2023        INTERVAL HISTORY  Discussed the use of AI scribe software for clinical note transcription  with the patient, who gave verbal consent to proceed.    Kelsey Carlson is a 58 y.o. female with oncologic history as above presenting to [facility] today with chief complaint of left breast pain. Patient presents unaccompanied to visit  today.  She began experiencing pain in her arm overnight. This morning, she noticed there was significant swelling and redness. She reported that her physical therapist noticed swelling and redness and advised her not to continue physical therapy today.  She has been preparing for radiation therapy by performing exercises that involve raising her arm over her head, which has caused increased soreness. She describes the sensation in her arm as 'cording' or 'strength' and notes that it has become progressively more painful. She has been experiencing fatigue and significant tiredness over the past few days, which she attributes to overexertion when she feels well. No fever but reports feeling cold, which is normal for her. She has not noticed any drainage from the incision sites.  Patient reports on her way to clinic today she received a call from the surgeon's office that an antibiotic was called in. She thinks it might be clindamycin however is unsure. She plans to pick it up after her appointment here. She is scheduled to see Dr. Vanderbilt tomorrow for post op follow up. She starts radiation 02/10/24. Her next PT appointment is on Monday.     ROS  All other systems are reviewed and are negative for acute change except as noted in the HPI.    Allergies  Allergen Reactions   Chlorhexidine  Hives    Per pt's  report.     Past Medical History:  Diagnosis Date   Arthritis    left hip   Cancer (HCC) 07/2023   left breast IMC   Gallstones    GERD (gastroesophageal reflux disease)    History of kidney stones    Lumbar back pain 06/28/2013   Sleep apnea    does not use CPAP   Urolithiasis    Wears glasses      Past Surgical History:  Procedure Laterality Date   ANKLE ARTHRODESIS  1981   left ankle bone tumor-   BLADDER SUSPENSION  2008   sling-cysto   breast biopsy  2002   lt br bx   BREAST BIOPSY  01/02/2024   MM LT RADIOACTIVE SEED EA ADD LESION LOC MAMMO GUIDE 01/02/2024 [facility]  MAMMOGRAPHY   BREAST BIOPSY  01/02/2024   MM LT RADIOACTIVE SEED LOC MAMMO GUIDE 01/02/2024 [facility] MAMMOGRAPHY   BREAST CYST EXCISION Left 02/19/2013   Procedure:  EXCISION BREAST MASS;  Surgeon: Debby A. Cornett, MD;  Location: [facility];  Service: General;  Laterality: Left;   BREAST EXCISIONAL BIOPSY Left 2014   benign   BREAST LUMPECTOMY WITH RADIOACTIVE SEED AND SENTINEL LYMPH NODE BIOPSY Left 01/06/2024   Procedure: BREAST LUMPECTOMY WITH RADIOACTIVE SEED AND SENTINEL LYMPH NODE BIOPSY;  Surgeon: Vanderbilt Debby, MD;  Location: [facility];  Service: General;  Laterality: Left;  BRACKETED LEFT BREAST SEED LUMPECTOMY LEFT SENTINEL LYMPH NODE MAPPING   BREAST REDUCTION SURGERY Bilateral 01/13/2024   Procedure: LEFT ONCOPLASTIC RECONSTRUCTION, RIGHT BREAST REDUCTION;  Surgeon: Arelia Filippo, MD;  Location: [facility];  Service: Plastics;  Laterality: Bilateral;   BREAST SURGERY     CHOLECYSTECTOMY N/A 08/27/2017   Procedure: LAPAROSCOPIC CHOLECYSTECTOMY WITH INTRAOPERATIVE CHOLANGIOGRAM;  Surgeon: Curvin Deward MOULD, MD;  Location: [facility] OR;  Service: General;  Laterality: N/A;   DILATION AND CURETTAGE  OF UTERUS     EXCISION OF BREAST BIOPSY Left 01/06/2024   Procedure: EXCISION OF BREAST BIOPSY;  Surgeon: Vanderbilt Ned, MD;  Location: [facility];  Service: General;  Laterality: Left;  LEFT NIPPLE BIOPSY   IR IMAGING GUIDED PORT INSERTION  07/15/2023   LUMBAR DISC SURGERY  1997   lumb lam   PORT-A-CATH REMOVAL Right 01/13/2024   Procedure: REMOVAL PORT-A-CATH;  Surgeon: Arelia Filippo, MD;  Location:  SURGERY CENTER;  Service: Plastics;  Laterality: Right;   TUBAL LIGATION     Essure    Social History   Socioeconomic History   Marital status: Married    Spouse name: Not on file   Number of children: Not on file   Years of education: Not on file   Highest education level: Not on file  Occupational History     Employer: [employer]    Comment: Mailroom  Tobacco Use   Smoking status: Former    Current packs/day: 0.00    Types: Cigarettes    Quit date: 05/20/1994    Years since quitting: 29.7   Smokeless tobacco: Never  Vaping Use   Vaping status: Some Days   Substances: Flavoring  Substance and Sexual Activity   Alcohol use: Yes    Comment: socially   Drug use: No   Sexual activity: Yes    Birth control/protection: Surgical, Post-menopausal    Comment: Essure BTL  Other Topics Concern   Not on file  Social History Narrative   Regular exercise- yes 3 x a week      Diet- fruits and veggies   Social Drivers of Health   Financial Resource Strain: Not on file  Food Insecurity: No Food Insecurity (06/11/2023)   Hunger Vital Sign    Worried About Running Out of Food in the Last Year: Never true    Ran Out of Food in the Last Year: Never true  Transportation Needs: No Transportation Needs (06/11/2023)   PRAPARE - Administrator, Civil Service (Medical): No    Lack of Transportation (Non-Medical): No  Physical Activity: Not on file  Stress: Not on file  Social Connections: Not on file  Intimate Partner Violence: Not At Risk (06/11/2023)   Humiliation, Afraid, Rape, and Kick questionnaire    Fear of Current or Ex-Partner: No    Emotionally Abused: No    Physically Abused: No    Sexually Abused: No    Family History  Problem Relation Age of Onset   Breast cancer Mother    Hypertension Father    Prostate cancer Father      Current Outpatient Medications:    albuterol  (VENTOLIN  HFA) 108 (90 Base) MCG/ACT inhaler, Inhale 2 puffs into the lungs every 6 (six) hours as needed for wheezing or shortness of breath., Disp: 8 g, Rfl: 2   ibuprofen  (ADVIL ) 800 MG tablet, Take 1 tablet (800 mg total) by mouth every 8 (eight) hours as needed., Disp: 30 tablet, Rfl: 0   metroNIDAZOLE  (METROCREAM ) 0.75 % cream, Apply to face one to two times a day for rosacea. (Patient not taking:  Reported on 01/29/2024), Disp: 45 g, Rfl: 11   naproxen  sodium (ALEVE ) 220 MG tablet, Take 220 mg by mouth. (Patient not taking: Reported on 01/29/2024), Disp: , Rfl:    oxyCODONE  (OXY IR/ROXICODONE ) 5 MG immediate release tablet, Take 1 tablet (5 mg total) by mouth every 6 (six) hours as needed for severe pain (pain score 7-10). (Patient not  taking: Reported on 01/29/2024), Disp: 15 tablet, Rfl: 0   pantoprazole  (PROTONIX ) 40 MG tablet, TAKE 1 TABLET BY MOUTH EVERY DAY (Patient not taking: Reported on 01/29/2024), Disp: 90 tablet, Rfl: 1   triamcinolone  ointment (KENALOG ) 0.5 %, Apply 1 Application topically 2 (two) times daily., Disp: 30 g, Rfl: 0  PHYSICAL EXAM ECOG FS:1 - Symptomatic but completely ambulatory    Vitals:   01/29/24 1341  BP: 114/62  Pulse: 84  Resp: 20  Temp: 98.2 F (36.8 C)  TempSrc: Oral  SpO2: 99%  Weight: 172 lb 6.4 oz (78.2 kg)   Physical Exam Vitals and nursing note reviewed.  Constitutional:      Appearance: She is not ill-appearing or toxic-appearing.  HENT:     Head: Normocephalic.  Eyes:     Conjunctiva/sclera: Conjunctivae normal.  Cardiovascular:     Rate and Rhythm: Normal rate.     Pulses: Normal pulses.  Pulmonary:     Effort: Pulmonary effort is normal.  Chest:     Comments: Left lateral breast is erythematous, warm and TTP. Incisions are intact. No drainage. No palpable fluctuance.  Abdominal:     General: There is no distension.  Musculoskeletal:     Cervical back: Normal range of motion.  Skin:    General: Skin is warm and dry.  Neurological:     Mental Status: She is alert.        LABORATORY DATA I have reviewed the data as listed    Latest Ref Rng & Units 12/01/2023   10:29 AM 11/24/2023   11:06 AM 11/17/2023    9:32 AM  CBC  WBC 4.0 - 10.5 K/uL 4.5  5.8  7.6   Hemoglobin 12.0 - 15.0 g/dL 89.3  89.2  89.3   Hematocrit 36.0 - 46.0 % 31.5  31.4  31.9   Platelets 150 - 400 K/uL 330  342  302         Latest Ref Rng & Units  12/01/2023   10:29 AM 11/24/2023   11:06 AM 11/17/2023    9:32 AM  CMP  Glucose 70 - 99 mg/dL 896  80  887   BUN 6 - 20 mg/dL 10  7  10    Creatinine 0.44 - 1.00 mg/dL 9.35  9.38  9.23   Sodium 135 - 145 mmol/L 141  141  137   Potassium 3.5 - 5.1 mmol/L 3.9  3.9  3.5   Chloride 98 - 111 mmol/L 110  109  105   CO2 22 - 32 mmol/L 27  27  26    Calcium 8.9 - 10.3 mg/dL 9.2  9.3  9.2   Total Protein 6.5 - 8.1 g/dL 5.7  5.9  6.2   Total Bilirubin 0.0 - 1.2 mg/dL 0.3  0.4  0.6   Alkaline Phos 38 - 126 U/L 61  58  65   AST 15 - 41 U/L 18  18  18    ALT 0 - 44 U/L 22  20  22         RADIOGRAPHIC STUDIES (from last 24 hours if applicable) I have personally reviewed the radiological images as listed and agreed with the findings in the report. No results found.      Visit Diagnosis: 1. Malignant neoplasm of overlapping sites of left breast in female, estrogen receptor positive (HCC)   2. Breast pain, left      No orders of the defined types were placed in this encounter.   All questions  were answered. The patient knows to call the clinic with any problems, questions or concerns. No barriers to learning was detected.  A total of more than 30 minutes were spent on this encounter with face-to-face time and non-face-to-face time, including preparing to see the patient, ordering tests and/or medications, counseling the patient and coordination of care as outlined above.    Thank you for allowing me to participate in the care of this patient.    Toy Samarin E  Walisiewicz, PA-C Department of Hematology/Oncology [facility] Phone: [phone]  [phone]    01/30/2024 9:06 AM

## 2024-01-29 NOTE — Telephone Encounter (Signed)
 Spoke with patient and confirmed appt for symptom management at 1:30pm.

## 2024-01-30 ENCOUNTER — Encounter: Payer: Self-pay | Admitting: Hematology and Oncology

## 2024-01-30 NOTE — Progress Notes (Signed)
 PROVIDER:  DEBBY CURTISTINE SHIPPER, MD  MRN: [id number] DOB: 02/22/1966 DATE OF ENCOUNTER: 01/30/2024 Subjective     Chief Complaint: Post Operative Visit (1ST POST-OP-TC-CDS-OP-BRKTD LT BR RSL([facility]) LUMP, LT NIPPLE BX, LT SLNB-8/19///)     History of Present Illness: Kelsey Carlson is a 58 y.o. female who is seen today for postop check after left breast lumpectomy and sentinel lymph node mapping for left breast cancer with breast reduction mammoplasty by plastics.  She presents with some redness and pain in her left axilla at the sentinel lymph node site.  No fever or chills.  This started a couple days ago.  She was seen by physical therapy as well.  She was placed empirically on doxycycline  until she can be seen..     Review of Systems: A complete review of systems was obtained from the patient.  I have reviewed this information and discussed as appropriate with the patient.  See HPI as well for other ROS.      Medical History: History reviewed. No pertinent past medical history.  There is no problem list on file for this patient.   Past Surgical History:  Procedure Laterality Date  . CHOLECYSTECTOMY    . MASTECTOMY PARTIAL / LUMPECTOMY       Allergies  Allergen Reactions  . Chlorhexidine  Hives    Per pt's  report.    Current Outpatient Medications on File Prior to Visit  Medication Sig Dispense Refill  . doxycycline  (MONODOX ) 100 MG capsule Take 1 capsule (100 mg total) by mouth 2 (two) times daily for 7 days 14 capsule 0  . triamcinolone  0.5 % ointment Apply 1 Application topically 2 (two) times daily     No current facility-administered medications on file prior to visit.    Family History  Problem Relation Age of Onset  . Skin cancer Mother   . Breast cancer Mother   . Skin cancer Father      Social History   Tobacco Use  Smoking Status Never  Smokeless Tobacco Never     Social History   Socioeconomic History  . Marital status: Married  Tobacco  Use  . Smoking status: Never  . Smokeless tobacco: Never  Substance and Sexual Activity  . Alcohol use: Defer  . Drug use: Defer  . Sexual activity: Defer   Social Drivers of Health   Food Insecurity: No Food Insecurity (06/11/2023)   [facility]   Hunger Vital Sign   . Within the past 12 months, you worried that your food would run out before you got the money to buy more.: Never true   . Within the past 12 months, the food you bought just didn't last and you didn't have money to get more.: Never true  Transportation Needs: No Transportation Needs (06/11/2023)   Received from [facility] - Transportation   . Lack of Transportation (Medical): No   . Lack of Transportation (Non-Medical): No  Housing Stability: Unknown (05/23/2023)   Housing Stability Vital Sign   . Homeless in the Last Year: No    Objective:    Vitals:   01/30/24 0900  PainSc: 0-No pain    There is no height or weight on file to calculate BMI.  Bilateral reduction mammoplasty scars healing well.  There is a seroma in the left axilla which is tender.  Trace minimal erythema noted.  Recommended aspiration due to discomfort.  Patient agreed to proceed.  Procedure described to the patient as well  as risks and benefit.  Under sterile conditions using Lorcet lidocaine  I aspirated about 30 cc of serous like fluid.  This did not appear to be pus.  No immediate complications.     Labs, Imaging and Diagnostic Testing:    FINAL MICROSCOPIC DIAGNOSIS:   A. BREAST, LEFT, LUMPECTOMY:  - Invasive ductal carcinoma, 1.5 cm, grade 2  - Ductal carcinoma in situ: Present  - Margins, invasive: All margins negative for invasive carcinoma      Closest, invasive: Cannot be determined due to separately submitted  margins  - Margins, DCIS: All margins negative for ductal carcinoma in situ      Closest, DCIS: Cannot be determined due to separately submitted  margins  - Lymphovascular invasion: Not  identified  - Prognostic markers:  ER 75%, positive, moderate to strong staining  intensity, PR 75%, positive, moderate to strong staining intensity, Her2  negative, Ki-67 20%  - Other: Biopsy site changes present  - See oncology table   B. BREAST, LEFT ADDITIONAL ANTERIOSUPERIOR MARGIN, EXCISION:  - Negative for carcinoma in situ or invasive carcinoma   C. BREAST, LEFT ADDITIONAL MEDIAL MARGIN, EXCISION:  - Negative for carcinoma in situ or invasive carcinoma   D. BREAST, LEFT ADDITIONAL LATERAL MARGIN, EXCISION:  - Negative for carcinoma in situ or invasive carcinoma   E. BREAST, LEFT ADDITIONAL POSTERIOR MARGIN, EXCISION:  - Negative for carcinoma in situ or invasive carcinoma   F. LYMPH NODE, LEFT AXILLARY, SENTINEL, EXCISION:  - 1 lymph node, negative for carcinoma   G. LYMPH NODE, LEFT AXILLARY, SENTINEL, EXCISION:  - 1 lymph node, negative for carcinoma   H. LYMPH NODE, LEFT AXILLARY, SENTINEL, EXCISION:  - 1 lymph node, negative for carcinoma   I. LYMPH NODE, LEFT AXILLARY, SENTINEL, EXCISION:  - 1 lymph node, negative for carcinoma    ONCOLOGY TABLE:    INVASIVE CARCINOMA OF THE BREAST:  Resection   Procedure: Lumpectomy  Specimen Laterality: Left  Histologic Type: Invasive ductal carcinoma  Histologic Grade:       Glandular (Acinar)/Tubular Differentiation: 3       Nuclear Pleomorphism: 2       Mitotic Rate: 1       Overall Grade: 2  Tumor Size: 1.5 x 1.2 x 1.0 cm  Ductal Carcinoma In Situ: Present  Lymphatic and/or Vascular Invasion: Not identified  Treatment Effect in the Breast: No known presurgical therapy  Margins: All margins negative for invasive carcinoma       Distance from Closest Margin (mm): Cannot be determined due to  separately submitted margins       Specify Closest Margin (required only if <41mm): Not applicable  DCIS Margins: Uninvolved by DCIS       Distance from Closest Margin (mm): Cannot be determined due to  separately submitted  margins       Specify Closest Margin (required only if <76mm): Not applicable  Regional Lymph Nodes:       Number of Lymph Nodes Examined: 4       Number of Sentinel Nodes Examined: 4       Number of Lymph Nodes with Macrometastases (>2 mm): 0       Number of Lymph Nodes with Micrometastases: 0       Number of Lymph Nodes with Isolated Tumor Cells (=0.2 mm or =200  cells): 0       Size of Largest Metastatic Deposit (mm): Not applicable       Extranodal Extension: Not applicable  Distant Metastasis:       Distant Site(s) Involved: Not applicable  Breast Biomarker Testing Performed on Previous Biopsy:       Testing Performed on Case Number: [id number]   ER 75%, positive, moderate to strong staining intensity, PR 75%,  positive, moderate to strong staining intensity, Her2 negative, Ki-67  20%  Pathologic Stage Classification (pTNM, AJCC 8th Edition): pT 1C, pN 0  Representative Tumor Block: A3  Comment(s): None  (v4.5.0.0)   GROSS DESCRIPTION:   A: Specimen type: Received fresh and placed in formalin at 10:10 AM on  01/06/2024, labeled left breast bracketed lumpectomy with radioactive  seeds x 2 (cold ischemic time greater than 1 hour).  Size: 14.8 cm anterior to posterior by 10.6 cm superior to inferior by  4.7 cm medial to lateral, with a 7.5 x 4.5 cm ellipse of tan skin  present on the anterior surface.  A 1.3 x 1.0 cm tan-pink partially  retracted nipple is identified.  Orientation: Specimen is received inked as follows: Anterior- green,  inferior- blue, lateral- orange, medial- yellow, posterior- black,  superior- red.  Localized area: No areas of interest are designated on the specimen.  At  the time of gross, the specimen is radiographed, and a radioactive seed  was identified and removed per protocol.  Cut surface: Yellow lobulated adipose tissue with tan-white, dense  fibrous tissue (approximately 40% fibrous).  The dermis underlying the  nipple is tan-white and firm,  extending into the underlying fibroadipose  tissue.  The indurated area of interest measures approximately 1.5 x 1.2  x 1.0 cm.  A silver metallic barbell shaped biopsy clip is identified  within a band of tan-white fibrotic tissue, measuring approximately 6.1  x 4.5 x 1.5 cm.  Please note patient is status post neoadjuvant therapy.  The fibrotic tissue measures approximately 2.5 cm from the indurated  tissue underlying the nipple.  Margins: Margins are inked as previously stated.  The possible lesion  underlying the nipple extends to the skin, measures approximately 2.5 cm  to the superior margin and 2.9 cm to the medial margin.  The fibrotic  tissue surrounding the biopsy clip focally extends to the inferior  margin, measures 1.0 cm to the lateral margin and 0.3 cm to the  posterior margin.  Prognostic indicators: Obtain from paraffin blocks if needed  Block summary:  18 blocks submitted  1 = medial margin, perpendicular  2-5 = area of interest underlying nipple  6 = area surrounding barbell clip  7-17 = representative sections of dense fibrous band adjacent to biopsy  clip (to include inferior and lateral margins)  18 = posterior lateral margin, perpendicular   Assessment and Plan:     Diagnoses and all orders for this visit:  Post-operative state     Seroma from left axilla aspirated.  Continue on antibiotics for now.  Does not appear overly infected at this point time but will monitor and have her return in 1 week for recheck.  She will call with any fever, chills, worsening pain or drainage.  No follow-ups on file.   DEBBY CURTISTINE SHIPPER, MD

## 2024-02-02 ENCOUNTER — Ambulatory Visit

## 2024-02-02 DIAGNOSIS — M25512 Pain in left shoulder: Secondary | ICD-10-CM | POA: Diagnosis not present

## 2024-02-02 DIAGNOSIS — Z483 Aftercare following surgery for neoplasm: Secondary | ICD-10-CM

## 2024-02-02 DIAGNOSIS — Z170 Estrogen receptor positive status [ER+]: Secondary | ICD-10-CM

## 2024-02-02 DIAGNOSIS — M25612 Stiffness of left shoulder, not elsewhere classified: Secondary | ICD-10-CM

## 2024-02-02 NOTE — Therapy (Signed)
 OUTPATIENT PT  UPPER EXTREMITY LYMPHEDEMA TREATMENT  Patient Name: Kelsey Carlson MRN: [id number] DOB:1965/06/20, 58 y.o., female Today's Date: 02/02/2024  END OF SESSION:  PT End of Session - 02/02/24 1006     Visit Number 2    Number of Visits 13    Date for PT Re-Evaluation 02/20/24    PT Start Time 1003    PT Stop Time 1100    PT Time Calculation (min) 57 min    Activity Tolerance Patient tolerated treatment well    Behavior During Therapy [facility] for tasks assessed/performed          Past Medical History:  Diagnosis Date   Arthritis    left hip   Cancer (HCC) 07/2023   left breast IMC   Gallstones    GERD (gastroesophageal reflux disease)    History of kidney stones    Lumbar back pain 06/28/2013   Sleep apnea    does not use CPAP   Urolithiasis    Wears glasses    Past Surgical History:  Procedure Laterality Date   ANKLE ARTHRODESIS  1981   left ankle bone tumor-   BLADDER SUSPENSION  2008   sling-cysto   breast biopsy  2002   lt br bx   BREAST BIOPSY  01/02/2024   MM LT RADIOACTIVE SEED EA ADD LESION LOC MAMMO GUIDE 01/02/2024 [facility]   BREAST BIOPSY  01/02/2024   MM LT RADIOACTIVE SEED LOC MAMMO GUIDE 01/02/2024 [facility]   BREAST CYST EXCISION Left 02/19/2013   Procedure:  EXCISION BREAST MASS;  Surgeon: Debby A. Cornett, MD;  Location: [facility];  Service: General;  Laterality: Left;   BREAST EXCISIONAL BIOPSY Left 2014   benign   BREAST LUMPECTOMY WITH RADIOACTIVE SEED AND SENTINEL LYMPH NODE BIOPSY Left 01/06/2024   Procedure: BREAST LUMPECTOMY WITH RADIOACTIVE SEED AND SENTINEL LYMPH NODE BIOPSY;  Surgeon: Vanderbilt Debby, MD;  Location: [facility];  Service: General;  Laterality: Left;  BRACKETED LEFT BREAST SEED LUMPECTOMY LEFT SENTINEL LYMPH NODE MAPPING   BREAST REDUCTION SURGERY Bilateral 01/13/2024   Procedure: LEFT ONCOPLASTIC RECONSTRUCTION, RIGHT BREAST REDUCTION;  Surgeon: Arelia Filippo,  MD;  Location: [facility];  Service: Plastics;  Laterality: Bilateral;   BREAST SURGERY     CHOLECYSTECTOMY N/A 08/27/2017   Procedure: LAPAROSCOPIC CHOLECYSTECTOMY WITH INTRAOPERATIVE CHOLANGIOGRAM;  Surgeon: Curvin Deward MOULD, MD;  Location: [facility];  Service: General;  Laterality: N/A;   DILATION AND CURETTAGE OF UTERUS     EXCISION OF BREAST BIOPSY Left 01/06/2024   Procedure: EXCISION OF BREAST BIOPSY;  Surgeon: Vanderbilt Debby, MD;  Location: [facility];  Service: General;  Laterality: Left;  LEFT NIPPLE BIOPSY   IR IMAGING GUIDED PORT INSERTION  07/15/2023   LUMBAR DISC SURGERY  1997   lumb lam   PORT-A-CATH REMOVAL Right 01/13/2024   Procedure: REMOVAL PORT-A-CATH;  Surgeon: Arelia Filippo, MD;  Location: [facility];  Service: Plastics;  Laterality: Right;   TUBAL LIGATION     Essure   Patient Active Problem List   Diagnosis Date Noted   Port-A-Cath in place 07/21/2023   Malignant neoplasm of overlapping sites of left female breast (HCC) 06/09/2023   Bilateral hearing loss 12/24/2022   Tinnitus of both ears 06/09/2020   Acute gastritis 08/03/2019   Chronic left hip pain 12/02/2017   Chronic back pain 12/02/2017   Class 1 obesity due to excess calories with body mass index (BMI) of 34.0  to 34.9 in adult 12/08/2014   Varicose veins of both legs with edema 12/08/2014   Insomnia 02/18/2012   MDD (recurrent major depressive disorder) in remission (HCC) 01/16/2012   TRANSAMINASES, SERUM, ELEVATED 05/27/2008   NEPHROLITHIASIS, HX OF 05/23/2008   Sleep apnea 05/30/2007   NECK PAIN, CHRONIC 02/25/2007   Chest pain 02/25/2007    REFERRING PROVIDER: Dr. Mackey Chad  REFERRING DIAG: Left axillary cording  THERAPY DIAG:  Stiffness of left shoulder, not elsewhere classified  Malignant neoplasm of overlapping sites of left breast in female, estrogen receptor positive (HCC)  Acute pain of left shoulder  Aftercare following surgery for  neoplasm  ONSET DATE: 01/26/2024  Rationale for Evaluation and Treatment: Rehabilitation  SUBJECTIVE                                                                                                                                                                                           SUBJECTIVE STATEMENT: I saw the surgeon Friday and he drained a vial full of fluid from my seroma. I had immediate pain relief. So after that I didn't stretch or anything over the weekend because I was to nervous to move my arm. He didn't think I had an infection, but told me to finish the antibiotic to be safe.   PERTINENT HISTORY: Patient was diagnosed in January 2025 with left grade 2 invasive ductal carcinoma breast cancer. It was ER/PR positive, HER2 negative with a Ki67 of 20%. She underwent neoadjuvant chemotherapy followed by a left lumpectomy and sentinel node biopsy (4 negative nodes) on 01/06/2024. She had a bilateral reduction and lift on 01/13/2024. She is soon to begin radiation and reports axillary cording.  PAIN:  Are you having pain? Yes NPRS scale: 1/10 Pain location: Left axilla and lateral left trunk Pain orientation: Left  PAIN TYPE: tight Pain description: intermittent  Aggravating factors: trying to reach overhead, having fluid drained Relieving factors: resting and advil   PRECAUTIONS: Other: Left arm lymphedema risk and recently completed chemotherapy  RED FLAGS: None   WEIGHT BEARING RESTRICTIONS: No  FALLS:  Has patient fallen in last 6 months? 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  COGNITION:  Overall cognitive status: Within functional limits for tasks assessed   PALPATION: Very tender to palpation left lateral breast with warmth to touch, edema present, and redness (see photo).  OBSERVATIONS / OTHER ASSESSMENTS:  Left axillary cording visible and palpable. She reports feeling it into her lateral trunk but due to significant edema present today, PT is unable to see cording in that area.    POSTURE: Forward head and rounded shoulders posture  HAND DOMINANCE: Right  UPPER EXTREMITY AROM/PROM:  A/PROM RIGHT   eval   Shoulder extension 43  Shoulder flexion 125  Shoulder abduction 150  Shoulder internal rotation 58  Shoulder external rotation 87    (Blank rows = not tested)  A/PROM LEFT   eval  Shoulder extension 37  Shoulder flexion 96  Shoulder abduction 97  Shoulder internal rotation 65  Shoulder external rotation 72    (Blank rows = not tested)  UPPER EXTREMITY STRENGTH: WFL but left not tested due to pain   LYMPHEDEMA ASSESSMENTS:   SURGERY TYPE/DATE: Left lumpectomy and sentinel node biopsy 01/06/2024; breast reduction 01/13/2024  NUMBER OF LYMPH NODES REMOVED: 4  CHEMOTHERAPY: Neoadjuvant completed 12/01/2023  RADIATION:Upcoming  HORMONE TREATMENT: Upcoming  INFECTIONS: none  LYMPHEDEMA ASSESSMENTS:   LANDMARK RIGHT eval  Around trunk at axillary fold with arms by side   Around trunk 4 below sternal notch   10 cm proximal to olecranon process 28.2  Olecranon process 25  10 cm proximal to ulnar styloid process 22.2  Just proximal to ulnar styloid process 15.3  Across hand at thumb web space 18.5  At base of 2nd digit 6.1  (Blank rows = not tested)  LANDMARK LEFT  eval  Around trunk at axillary fold with arms by side   Around trunk 4 below sternal notch   10 cm proximal to olecranon process 28  Olecranon  process 24.3  10 cm proximal to ulnar styloid process 21.4  Just proximal to ulnar styloid process 15  Across hand at thumb web space 18.5  At base of 2nd digit 5.9  (Blank rows = not tested)                                                                                                                              TREATMENT DATE:  02/02/24: CATHAY done for baseline and answered pts questions about this Manual Therapy P/ROM to Lt shoulder into flex, abd and D2 with scapular depression by therapist throughout, cuing throughout to relax shoulder due to muscle guarding MFR to area of cording in Lt axilla  STM to axilla where scar tissue palpable Neural tension stretch with good stretch felt by pt in cord  01/29/24: Educated pt on HEP to restore shoulder ROM    PATIENT EDUCATION:  Education details: Lymphedema precautions Person educated: Patient Education method: Programmer, multimedia, Facilities manager, Verbal cues, and Handouts Education comprehension: verbalized understanding and returned demonstration  HOME EXERCISE PROGRAM: Active assist  and active shoulder ROM: flexion, abduction, ER, and scapular retraction  ASSESSMENT:  CLINICAL IMPRESSION: Today did SOZO to establish baseline. Then began manual therapy working to decrease fascial restrictions caused by cording limiting her end Lt shoulder ROM. Pt struggled with relaxing due to muscle guarding but was able to notice improved P/ROM by end of session. She reports immediate pain relief after seroma drained Friday but does feel like there is still some fluid present. She goes back to surgeon this Friday. Advised her to resume HEP stretches during the week, pt verbalized understanding.   OBJECTIVE IMPAIRMENTS: decreased ROM, increased edema, increased fascial restrictions, impaired UE functional use, postural dysfunction, and pain.   ACTIVITY LIMITATIONS: carrying and reach over head  PARTICIPATION LIMITATIONS: cleaning, laundry, and  community activity  PERSONAL FACTORS: Time since onset of injury/illness/exacerbation are also affecting patient's functional outcome.   REHAB POTENTIAL: Excellent  CLINICAL DECISION MAKING: Stable/uncomplicated  EVALUATION COMPLEXITY: Low  GOALS: Goals reviewed with patient? Yes  SHORT TERM GOALS = LONG TERM GOALS: Target date: 02/26/2024    Patient will be independent with her home exercise program for improving shoulder ROM and function. Goal status: INITIAL  2.  Patient will report >/= 50% reduction in left lateral breast and axillary pain to better tolerate daily activities. Goal status: INITIAL  3.  Patient will improve her DASH score to </= 8 for overall improved UE function   Goal status: INITIAL  4.  Patient will increase left shoulder flexion and abduction to >/= 120 degrees for increased ability to reach overhead. Goal status: INITIAL  5.  Patient will verbalize good understanding of lymphedema risk reduction practices. Goal status: INITIAL   PLAN:  PT FREQUENCY: 3x/week  PT DURATION: 4 weeks  PLANNED INTERVENTIONS: 97164- PT Re-evaluation, 97110-Therapeutic exercises, 97530- Therapeutic activity, [id number]- Neuromuscular re-education, 97535- Self Care, [id number]- Manual therapy, 20560 (1-2 muscles), 20561 (3+ muscles)- Dry Needling, Patient/Family education, Manual lymph drainage, and Scar mobilization  PLAN FOR NEXT SESSION: Cont PROM and manual techniques to improve cording, add AA/ROM exs.    Berwyn Knights, PTA 02/02/24 12:45 PM

## 2024-02-03 ENCOUNTER — Ambulatory Visit: Admitting: Rehabilitation

## 2024-02-04 ENCOUNTER — Ambulatory Visit

## 2024-02-04 DIAGNOSIS — M25512 Pain in left shoulder: Secondary | ICD-10-CM

## 2024-02-04 DIAGNOSIS — Z170 Estrogen receptor positive status [ER+]: Secondary | ICD-10-CM

## 2024-02-04 DIAGNOSIS — Z483 Aftercare following surgery for neoplasm: Secondary | ICD-10-CM

## 2024-02-04 DIAGNOSIS — M25612 Stiffness of left shoulder, not elsewhere classified: Secondary | ICD-10-CM

## 2024-02-04 NOTE — Therapy (Signed)
 OUTPATIENT PT  UPPER EXTREMITY LYMPHEDEMA TREATMENT  Patient Name: Kelsey Carlson MRN: [id number] DOB:Jul 04, 1965, 58 y.o., female Today's Date: 02/04/2024  END OF SESSION:  PT End of Session - 02/04/24 1112     Visit Number 3    Number of Visits 13    Date for PT Re-Evaluation 02/20/24    PT Start Time 1103    PT Stop Time 1200    PT Time Calculation (min) 57 min    Activity Tolerance Patient tolerated treatment well    Behavior During Therapy [facility] for tasks assessed/performed          Past Medical History:  Diagnosis Date   Arthritis    left hip   Cancer (HCC) 07/2023   left breast IMC   Gallstones    GERD (gastroesophageal reflux disease)    History of kidney stones    Lumbar back pain 06/28/2013   Sleep apnea    does not use CPAP   Urolithiasis    Wears glasses    Past Surgical History:  Procedure Laterality Date   ANKLE ARTHRODESIS  1981   left ankle bone tumor-   BLADDER SUSPENSION  2008   sling-cysto   breast biopsy  2002   lt br bx   BREAST BIOPSY  01/02/2024   MM LT RADIOACTIVE SEED EA ADD LESION LOC MAMMO GUIDE 01/02/2024 [facility]   BREAST BIOPSY  01/02/2024   MM LT RADIOACTIVE SEED LOC MAMMO GUIDE 01/02/2024 [facility]   BREAST CYST EXCISION Left 02/19/2013   Procedure:  EXCISION BREAST MASS;  Surgeon: Debby A. Cornett, MD;  Location: [facility];  Service: General;  Laterality: Left;   BREAST EXCISIONAL BIOPSY Left 2014   benign   BREAST LUMPECTOMY WITH RADIOACTIVE SEED AND SENTINEL LYMPH NODE BIOPSY Left 01/06/2024   Procedure: BREAST LUMPECTOMY WITH RADIOACTIVE SEED AND SENTINEL LYMPH NODE BIOPSY;  Surgeon: Vanderbilt Debby, MD;  Location: [facility];  Service: General;  Laterality: Left;  BRACKETED LEFT BREAST SEED LUMPECTOMY LEFT SENTINEL LYMPH NODE MAPPING   BREAST REDUCTION SURGERY Bilateral 01/13/2024   Procedure: LEFT ONCOPLASTIC RECONSTRUCTION, RIGHT BREAST REDUCTION;  Surgeon: Arelia Filippo,  MD;  Location: [facility];  Service: Plastics;  Laterality: Bilateral;   BREAST SURGERY     CHOLECYSTECTOMY N/A 08/27/2017   Procedure: LAPAROSCOPIC CHOLECYSTECTOMY WITH INTRAOPERATIVE CHOLANGIOGRAM;  Surgeon: Curvin Deward MOULD, MD;  Location: [facility];  Service: General;  Laterality: N/A;   DILATION AND CURETTAGE OF UTERUS     EXCISION OF BREAST BIOPSY Left 01/06/2024   Procedure: EXCISION OF BREAST BIOPSY;  Surgeon: Vanderbilt Debby, MD;  Location: [facility];  Service: General;  Laterality: Left;  LEFT NIPPLE BIOPSY   IR IMAGING GUIDED PORT INSERTION  07/15/2023   LUMBAR DISC SURGERY  1997   lumb lam   PORT-A-CATH REMOVAL Right 01/13/2024   Procedure: REMOVAL PORT-A-CATH;  Surgeon: Arelia Filippo, MD;  Location: [facility];  Service: Plastics;  Laterality: Right;   TUBAL LIGATION     Essure   Patient Active Problem List   Diagnosis Date Noted   Port-A-Cath in place 07/21/2023   Malignant neoplasm of overlapping sites of left female breast (HCC) 06/09/2023   Bilateral hearing loss 12/24/2022   Tinnitus of both ears 06/09/2020   Acute gastritis 08/03/2019   Chronic left hip pain 12/02/2017   Chronic back pain 12/02/2017   Class 1 obesity due to excess calories with body mass index (BMI) of 34.0  to 34.9 in adult 12/08/2014   Varicose veins of both legs with edema 12/08/2014   Insomnia 02/18/2012   MDD (recurrent major depressive disorder) in remission (HCC) 01/16/2012   TRANSAMINASES, SERUM, ELEVATED 05/27/2008   NEPHROLITHIASIS, HX OF 05/23/2008   Sleep apnea 05/30/2007   NECK PAIN, CHRONIC 02/25/2007   Chest pain 02/25/2007    REFERRING PROVIDER: Dr. Mackey Chad  REFERRING DIAG: Left axillary cording  THERAPY DIAG:  Stiffness of left shoulder, not elsewhere classified  Malignant neoplasm of overlapping sites of left breast in female, estrogen receptor positive (HCC)  Acute pain of left shoulder  Aftercare following surgery for  neoplasm  ONSET DATE: 01/26/2024  Rationale for Evaluation and Treatment: Rehabilitation  SUBJECTIVE                                                                                                                                                                                           SUBJECTIVE STATEMENT: I can tell my seroma needs to be drained again. The doctor was able to get me in tomorrow instead of Friday. It's a little red but I'm still on the antibiotic. I think it's just because my skin is irritated.   PERTINENT HISTORY: Patient was diagnosed in January 2025 with left grade 2 invasive ductal carcinoma breast cancer. It was ER/PR positive, HER2 negative with a Ki67 of 20%. She underwent neoadjuvant chemotherapy followed by a left lumpectomy and sentinel node biopsy (4 negative nodes) on 01/06/2024. She had a bilateral reduction and lift on 01/13/2024. She is soon to begin radiation and reports axillary cording.  PAIN:  Are you having pain? Yes NPRS scale: 2/10 Pain location: Left axilla and lateral left trunk Pain orientation: Left  PAIN TYPE: tight Pain description: intermittent  Aggravating factors: trying to reach overhead, having fluid drained Relieving factors: resting and advil   PRECAUTIONS: Other: Left arm lymphedema risk and recently completed chemotherapy  RED FLAGS: None   WEIGHT BEARING RESTRICTIONS: No  FALLS:  Has patient fallen in last 6 months? 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  COGNITION:  Overall cognitive status: Within functional limits for tasks assessed   PALPATION:  Very tender to palpation left lateral breast with warmth to touch, edema present, and redness (see photo).  OBSERVATIONS / OTHER ASSESSMENTS:  Left axillary cording visible and palpable. She reports feeling it into her lateral trunk but due to significant edema present today, PT is unable to see cording in that area.    POSTURE: Forward head and rounded shoulders posture  HAND DOMINANCE: Right  UPPER EXTREMITY AROM/PROM:  A/PROM RIGHT   eval   Shoulder extension 43  Shoulder flexion 125  Shoulder abduction 150  Shoulder internal rotation 58  Shoulder external rotation 87    (Blank rows = not tested)  A/PROM LEFT   eval  Shoulder extension 37  Shoulder flexion 96  Shoulder abduction 97  Shoulder internal rotation 65  Shoulder external rotation 72    (Blank rows = not tested)  UPPER EXTREMITY STRENGTH: WFL but left not tested due to pain   LYMPHEDEMA ASSESSMENTS:   SURGERY TYPE/DATE: Left lumpectomy and sentinel node biopsy 01/06/2024; breast reduction 01/13/2024  NUMBER OF LYMPH NODES REMOVED: 4  CHEMOTHERAPY: Neoadjuvant completed 12/01/2023  RADIATION:Upcoming  HORMONE TREATMENT: Upcoming  INFECTIONS: none  LYMPHEDEMA ASSESSMENTS:   LANDMARK RIGHT eval  Around trunk at axillary fold with arms by side   Around trunk 4 below sternal notch   10 cm proximal to olecranon process 28.2  Olecranon process 25  10 cm proximal to ulnar styloid process 22.2  Just proximal to ulnar styloid process 15.3  Across hand at thumb web space 18.5  At base of 2nd digit 6.1  (Blank rows = not tested)  LANDMARK LEFT  eval  Around trunk at axillary fold with arms by side   Around trunk 4 below sternal notch   10 cm proximal to olecranon process 28  Olecranon process 24.3  10 cm proximal to ulnar styloid process 21.4  Just proximal to  ulnar styloid process 15  Across hand at thumb web space 18.5  At base of 2nd digit 5.9  (Blank rows = not tested)                                                                                                                              TREATMENT DATE:  02/04/24: Therapeutic Exercises Pulleys into flex and abd x 2 mins each returning therapist demo  Roll yellow ball up wall into flex x 10 and Lt UE abd x 5 Modified downward dog on wall x 5 reps, 5 sec holds returning demo Manual Therapy P/ROM to Lt shoulder into flex, abd and D2 with scapular depression by therapist throughout, cuing throughout to relax shoulder due to muscle guarding MFR to area of cording in Lt axilla but gently as pts seroma is refilling  02/02/24: SOZO done for baseline and answered pts questions about this Manual Therapy P/ROM to Lt shoulder into flex, abd and D2 with scapular depression by therapist throughout, cuing throughout to relax shoulder due to muscle guarding MFR to  area of cording in Lt axilla  STM to axilla where scar tissue palpable Neural tension stretch with good stretch felt by pt in cord  01/29/24: Educated pt on HEP to restore shoulder ROM    PATIENT EDUCATION:  Education details: Lymphedema precautions Person educated: Patient Education method: Programmer, multimedia, Facilities manager, Verbal cues, and Handouts Education comprehension: verbalized understanding and returned demonstration  HOME EXERCISE PROGRAM: Active assist and active shoulder ROM: flexion, abduction, ER, and scapular retraction  ASSESSMENT:  CLINICAL IMPRESSION: Assessed pts skin and redness is present over area of seroma however it seems irritation, not infection as it's isolated to area of seroma and pt is still taking antibiotics. She sees Careers adviser after PT appt tomorrow to have it drained again. Today added gentle AA/A/ROM and then continued with manual therapy working to improve end P/ROM of Lt shoulder and trying to decrease  fascial restrictions from cording in axilla.   OBJECTIVE IMPAIRMENTS: decreased ROM, increased edema, increased fascial restrictions, impaired UE functional use, postural dysfunction, and pain.   ACTIVITY LIMITATIONS: carrying and reach over head  PARTICIPATION LIMITATIONS: cleaning, laundry, and community activity  PERSONAL FACTORS: Time since onset of injury/illness/exacerbation are also affecting patient's functional outcome.   REHAB POTENTIAL: Excellent  CLINICAL DECISION MAKING: Stable/uncomplicated  EVALUATION COMPLEXITY: Low  GOALS: Goals reviewed with patient? Yes  SHORT TERM GOALS = LONG TERM GOALS: Target date: 02/26/2024    Patient will be independent with her home exercise program for improving shoulder ROM and function. Goal status: INITIAL  2.  Patient will report >/= 50% reduction in left lateral breast and axillary pain to better tolerate daily activities. Goal status: INITIAL  3.  Patient will improve her DASH score to </= 8 for overall improved UE function   Goal status: INITIAL  4.  Patient will increase left shoulder flexion and abduction to >/= 120 degrees for increased ability to reach overhead. Goal status: INITIAL  5.  Patient will verbalize good understanding of lymphedema risk reduction practices. Goal status: INITIAL   PLAN:  PT FREQUENCY: 3x/week  PT DURATION: 4 weeks  PLANNED INTERVENTIONS: 97164- PT Re-evaluation, 97110-Therapeutic exercises, 97530- Therapeutic activity, [id number]- Neuromuscular re-education, 97535- Self Care, 02859- Manual therapy, 20560 (1-2 muscles), 20561 (3+ muscles)- Dry Needling, Patient/Family education, Manual lymph drainage, and Scar mobilization  PLAN FOR NEXT SESSION: How is seroma? Cont PROM and manual techniques to improve cording, add AA/ROM exs   Berwyn Knights, PTA 02/04/24 12:28 PM

## 2024-02-05 ENCOUNTER — Ambulatory Visit

## 2024-02-05 DIAGNOSIS — M25512 Pain in left shoulder: Secondary | ICD-10-CM | POA: Diagnosis not present

## 2024-02-05 DIAGNOSIS — M25612 Stiffness of left shoulder, not elsewhere classified: Secondary | ICD-10-CM

## 2024-02-05 DIAGNOSIS — Z483 Aftercare following surgery for neoplasm: Secondary | ICD-10-CM

## 2024-02-05 DIAGNOSIS — Z170 Estrogen receptor positive status [ER+]: Secondary | ICD-10-CM

## 2024-02-05 NOTE — Therapy (Signed)
 OUTPATIENT PT  UPPER EXTREMITY LYMPHEDEMA TREATMENT  Patient Name: Kelsey Carlson MRN: [id number] DOB:Dec 16, 1965, 58 y.o., female Today's Date: 02/05/2024  END OF SESSION:  PT End of Session - 02/05/24 1005     Visit Number 4    Number of Visits 13    Date for Recertification  02/20/24    PT Start Time 1003    PT Stop Time 1045   pt requested leaving early due to another appt   PT Time Calculation (min) 42 min    Activity Tolerance Patient tolerated treatment well    Behavior During Therapy [facility] for tasks assessed/performed          Past Medical History:  Diagnosis Date   Arthritis    left hip   Cancer (HCC) 07/2023   left breast IMC   Gallstones    GERD (gastroesophageal reflux disease)    History of kidney stones    Lumbar back pain 06/28/2013   Sleep apnea    does not use CPAP   Urolithiasis    Wears glasses    Past Surgical History:  Procedure Laterality Date   ANKLE ARTHRODESIS  1981   left ankle bone tumor-   BLADDER SUSPENSION  2008   sling-cysto   breast biopsy  2002   lt br bx   BREAST BIOPSY  01/02/2024   MM LT RADIOACTIVE SEED EA ADD LESION LOC MAMMO GUIDE 01/02/2024 [facility]   BREAST BIOPSY  01/02/2024   MM LT RADIOACTIVE SEED LOC MAMMO GUIDE 01/02/2024 [facility]   BREAST CYST EXCISION Left 02/19/2013   Procedure:  EXCISION BREAST MASS;  Surgeon: Debby A. Cornett, MD;  Location: [facility];  Service: General;  Laterality: Left;   BREAST EXCISIONAL BIOPSY Left 2014   benign   BREAST LUMPECTOMY WITH RADIOACTIVE SEED AND SENTINEL LYMPH NODE BIOPSY Left 01/06/2024   Procedure: BREAST LUMPECTOMY WITH RADIOACTIVE SEED AND SENTINEL LYMPH NODE BIOPSY;  Surgeon: Vanderbilt Debby, MD;  Location: [facility];  Service: General;  Laterality: Left;  BRACKETED LEFT BREAST SEED LUMPECTOMY LEFT SENTINEL LYMPH NODE MAPPING   BREAST REDUCTION SURGERY Bilateral 01/13/2024   Procedure: LEFT ONCOPLASTIC RECONSTRUCTION,  RIGHT BREAST REDUCTION;  Surgeon: Arelia Filippo, MD;  Location: [facility];  Service: Plastics;  Laterality: Bilateral;   BREAST SURGERY     CHOLECYSTECTOMY N/A 08/27/2017   Procedure: LAPAROSCOPIC CHOLECYSTECTOMY WITH INTRAOPERATIVE CHOLANGIOGRAM;  Surgeon: Curvin Deward MOULD, MD;  Location: [facility];  Service: General;  Laterality: N/A;   DILATION AND CURETTAGE OF UTERUS     EXCISION OF BREAST BIOPSY Left 01/06/2024   Procedure: EXCISION OF BREAST BIOPSY;  Surgeon: Vanderbilt Debby, MD;  Location: [facility];  Service: General;  Laterality: Left;  LEFT NIPPLE BIOPSY   IR IMAGING GUIDED PORT INSERTION  07/15/2023   LUMBAR DISC SURGERY  1997   lumb lam   PORT-A-CATH REMOVAL Right 01/13/2024   Procedure: REMOVAL PORT-A-CATH;  Surgeon: Arelia Filippo, MD;  Location: [facility];  Service: Plastics;  Laterality: Right;   TUBAL LIGATION     Essure   Patient Active Problem List   Diagnosis Date Noted   Port-A-Cath in place 07/21/2023   Malignant neoplasm of overlapping sites of left female breast (HCC) 06/09/2023   Bilateral hearing loss 12/24/2022   Tinnitus of both ears 06/09/2020   Acute gastritis 08/03/2019   Chronic left hip pain 12/02/2017   Chronic back pain 12/02/2017   Class 1 obesity due to  excess calories with body mass index (BMI) of 34.0 to 34.9 in adult 12/08/2014   Varicose veins of both legs with edema 12/08/2014   Insomnia 02/18/2012   MDD (recurrent major depressive disorder) in remission (HCC) 01/16/2012   TRANSAMINASES, SERUM, ELEVATED 05/27/2008   NEPHROLITHIASIS, HX OF 05/23/2008   Sleep apnea 05/30/2007   NECK PAIN, CHRONIC 02/25/2007   Chest pain 02/25/2007    REFERRING PROVIDER: Dr. Mackey Chad  REFERRING DIAG: Left axillary cording  THERAPY DIAG:  Stiffness of left shoulder, not elsewhere classified  Malignant neoplasm of overlapping sites of left breast in female, estrogen receptor positive (HCC)  Acute pain of  left shoulder  Aftercare following surgery for neoplasm  ONSET DATE: 01/26/2024  Rationale for Evaluation and Treatment: Rehabilitation  SUBJECTIVE                                                                                                                                                                                           SUBJECTIVE STATEMENT: The seroma is a little worse but the redness is not spreading.   PERTINENT HISTORY: Patient was diagnosed in January 2025 with left grade 2 invasive ductal carcinoma breast cancer. It was ER/PR positive, HER2 negative with a Ki67 of 20%. She underwent neoadjuvant chemotherapy followed by a left lumpectomy and sentinel node biopsy (4 negative nodes) on 01/06/2024. She had a bilateral reduction and lift on 01/13/2024. She is soon to begin radiation and reports axillary cording.  PAIN:  Are you having pain? Yes NPRS scale: 2-4/10 Pain location: Left axilla and lateral left trunk Pain orientation: Left  PAIN TYPE: tight Pain description: intermittent  Aggravating factors: trying to reach overhead, having fluid drained Relieving factors: resting and advil   PRECAUTIONS: Other: Left arm lymphedema risk and recently completed chemotherapy  RED FLAGS: None   WEIGHT BEARING RESTRICTIONS: No  FALLS:  Has patient fallen in last 6 months? 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  COGNITION:  Overall cognitive status: Within functional  limits for tasks assessed   PALPATION: Very tender to palpation left lateral breast with warmth to touch, edema present, and redness (see photo).  OBSERVATIONS /  OTHER ASSESSMENTS:  Left axillary cording visible and palpable. She reports feeling it into her lateral trunk but due to significant edema present today, PT is unable to see cording in that area.    POSTURE: Forward head and rounded shoulders posture  HAND DOMINANCE: Right  UPPER EXTREMITY AROM/PROM:  A/PROM RIGHT   eval   Shoulder extension 43  Shoulder flexion 125  Shoulder abduction 150  Shoulder internal rotation 58  Shoulder external rotation 87    (Blank rows = not tested)  A/PROM LEFT   eval  Shoulder extension 37  Shoulder flexion 96  Shoulder abduction 97  Shoulder internal rotation 65  Shoulder external rotation 72    (Blank rows = not tested)  UPPER EXTREMITY STRENGTH: WFL but left not tested due to pain   LYMPHEDEMA ASSESSMENTS:   SURGERY TYPE/DATE: Left lumpectomy and sentinel node biopsy 01/06/2024; breast reduction 01/13/2024  NUMBER OF LYMPH NODES REMOVED: 4  CHEMOTHERAPY: Neoadjuvant completed 12/01/2023  RADIATION:Upcoming  HORMONE TREATMENT: Upcoming  INFECTIONS: none  LYMPHEDEMA ASSESSMENTS:   LANDMARK RIGHT eval  Around trunk at axillary fold with arms by side   Around trunk 4 below sternal notch   10 cm proximal to olecranon process 28.2  Olecranon process 25  10 cm proximal to ulnar styloid process 22.2  Just proximal to ulnar styloid process 15.3  Across hand at thumb web space 18.5  At base of 2nd digit 6.1  (Blank rows = not tested)  LANDMARK LEFT  eval  Around trunk at axillary fold with arms by side   Around trunk 4 below sternal notch   10 cm proximal to olecranon process 28  Olecranon process 24.3  10 cm proximal to ulnar styloid process 21.4  Just proximal to ulnar styloid process 15  Across hand at thumb web space 18.5  At base of 2nd digit 5.9  (Blank  rows = not tested)                                                                                                                              TREATMENT DATE:  02/05/24: Therapeutic Exercises Pulleys into flex and abd x 2 mins each returning therapist demo  Roll yellow ball up wall into flex x 10 and Lt UE abd x 5 Modified downward dog on wall x 5 reps, 5 sec holds returning demo Therapeutic Activities Supine over half foam roll for following: Bil UE horz abd, bil UE scaption into a V x 10 each, then bil UE abd in a snow angel x 10, 5 sec holds returning therapist demo for each Manual Therapy P/ROM to Lt shoulder into flex, abd and D2 with scapular depression by therapist throughout, cuing throughout to relax shoulder due to muscle guarding MFR to area of cording in Lt axilla but gently as pts seroma is refilling  02/04/24: Therapeutic Exercises Pulleys into flex and abd x 2 mins each returning therapist demo  Roll yellow ball  up wall into flex x 10 and Lt UE abd x 5 Modified downward dog on wall x 5 reps, 5 sec holds returning demo Manual Therapy P/ROM to Lt shoulder into flex, abd and D2 with scapular depression by therapist throughout, cuing throughout to relax shoulder due to muscle guarding MFR to area of cording in Lt axilla but gently as pts seroma is refilling  02/02/24: SOZO done for baseline and answered pts questions about this Manual Therapy P/ROM to Lt shoulder into flex, abd and D2 with scapular depression by therapist throughout, cuing throughout to relax shoulder due to muscle guarding MFR to area of cording in Lt axilla  STM to axilla where scar tissue palpable Neural tension stretch with good stretch felt by pt in cord     PATIENT EDUCATION:  Education details: Lymphedema precautions Person educated: Patient Education method: Programmer, multimedia, Demonstration, Verbal cues, and Handouts Education comprehension: verbalized understanding and returned  demonstration  HOME EXERCISE PROGRAM: Active assist and active shoulder ROM: flexion, abduction, ER, and scapular retraction  ASSESSMENT:  CLINICAL IMPRESSION: Continued with AA/A/ROM and gentle manual therapy. Pt is going to surgeon to have seroma drained again after this appt today and in fact, needed to leave early for that appt. Pt will benefit from continued physical therapy at this time to help decrease fascial restrictions causing cording while she is getting seroma drained. Then will also benefit from progression of postural strengthening once seroma is better controlled once it gets to that point.   OBJECTIVE IMPAIRMENTS: decreased ROM, increased edema, increased fascial restrictions, impaired UE functional use, postural dysfunction, and pain.   ACTIVITY LIMITATIONS: carrying and reach over head  PARTICIPATION LIMITATIONS: cleaning, laundry, and community activity  PERSONAL FACTORS: Time since onset of injury/illness/exacerbation are also affecting patient's functional outcome.   REHAB POTENTIAL: Excellent  CLINICAL DECISION MAKING: Stable/uncomplicated  EVALUATION COMPLEXITY: Low  GOALS: Goals reviewed with patient? Yes  SHORT TERM GOALS = LONG TERM GOALS: Target date: 02/26/2024    Patient will be independent with her home exercise program for improving shoulder ROM and function. Goal status: INITIAL  2.  Patient will report >/= 50% reduction in left lateral breast and axillary pain to better tolerate daily activities. Goal status: INITIAL  3.  Patient will improve her DASH score to </= 8 for overall improved UE function   Goal status: INITIAL  4.  Patient will increase left shoulder flexion and abduction to >/= 120 degrees for increased ability to reach overhead. Goal status: INITIAL  5.  Patient will verbalize good understanding of lymphedema risk reduction practices. Goal status: INITIAL   PLAN:  PT FREQUENCY: 3x/week  PT DURATION: 4 weeks  PLANNED  INTERVENTIONS: 97164- PT Re-evaluation, 97110-Therapeutic exercises, 97530- Therapeutic activity, [id number]- Neuromuscular re-education, 97535- Self Care, [id number]- Manual therapy, 20560 (1-2 muscles), 20561 (3+ muscles)- Dry Needling, Patient/Family education, Manual lymph drainage, and Scar mobilization  PLAN FOR NEXT SESSION: How was getting seroma drained? Get appt at [facility]? Issue 1/2 gray foam to wear under compression at area of seroma. Cont PROM and manual techniques to improve cording, cont AA/ROM exs and progress to postural strength once able   Berwyn Knights, PTA 02/05/24 10:52 AM

## 2024-02-09 ENCOUNTER — Ambulatory Visit

## 2024-02-09 ENCOUNTER — Telehealth: Payer: Self-pay

## 2024-02-09 ENCOUNTER — Telehealth: Payer: Self-pay | Admitting: *Deleted

## 2024-02-09 DIAGNOSIS — C50812 Malignant neoplasm of overlapping sites of left female breast: Secondary | ICD-10-CM

## 2024-02-09 DIAGNOSIS — Z483 Aftercare following surgery for neoplasm: Secondary | ICD-10-CM

## 2024-02-09 DIAGNOSIS — M25612 Stiffness of left shoulder, not elsewhere classified: Secondary | ICD-10-CM

## 2024-02-09 DIAGNOSIS — M25512 Pain in left shoulder: Secondary | ICD-10-CM | POA: Diagnosis not present

## 2024-02-09 NOTE — Therapy (Signed)
 OUTPATIENT PT  UPPER EXTREMITY LYMPHEDEMA TREATMENT  Patient Name: Kelsey Carlson MRN: [id number] DOB:03-Jan-1966, 58 y.o., female Today's Date: 02/09/2024  END OF SESSION:  PT End of Session - 02/09/24 1111     Visit Number 5    Number of Visits 13    Date for Recertification  02/20/24    PT Start Time 1104    PT Stop Time 1202    PT Time Calculation (min) 58 min    Activity Tolerance Patient tolerated treatment well    Behavior During Therapy [facility] for tasks assessed/performed          Past Medical History:  Diagnosis Date   Arthritis    left hip   Cancer (HCC) 07/2023   left breast IMC   Gallstones    GERD (gastroesophageal reflux disease)    History of kidney stones    Lumbar back pain 06/28/2013   Sleep apnea    does not use CPAP   Urolithiasis    Wears glasses    Past Surgical History:  Procedure Laterality Date   ANKLE ARTHRODESIS  1981   left ankle bone tumor-   BLADDER SUSPENSION  2008   sling-cysto   breast biopsy  2002   lt br bx   BREAST BIOPSY  01/02/2024   MM LT RADIOACTIVE SEED EA ADD LESION LOC MAMMO GUIDE 01/02/2024 [facility]   BREAST BIOPSY  01/02/2024   MM LT RADIOACTIVE SEED LOC MAMMO GUIDE 01/02/2024 [facility]   BREAST CYST EXCISION Left 02/19/2013   Procedure:  EXCISION BREAST MASS;  Surgeon: Debby A. Cornett, MD;  Location: [facility];  Service: General;  Laterality: Left;   BREAST EXCISIONAL BIOPSY Left 2014   benign   BREAST LUMPECTOMY WITH RADIOACTIVE SEED AND SENTINEL LYMPH NODE BIOPSY Left 01/06/2024   Procedure: BREAST LUMPECTOMY WITH RADIOACTIVE SEED AND SENTINEL LYMPH NODE BIOPSY;  Surgeon: Vanderbilt Debby, MD;  Location: [facility];  Service: General;  Laterality: Left;  BRACKETED LEFT BREAST SEED LUMPECTOMY LEFT SENTINEL LYMPH NODE MAPPING   BREAST REDUCTION SURGERY Bilateral 01/13/2024   Procedure: LEFT ONCOPLASTIC RECONSTRUCTION, RIGHT BREAST REDUCTION;  Surgeon: Arelia Filippo,  MD;  Location: [facility];  Service: Plastics;  Laterality: Bilateral;   BREAST SURGERY     CHOLECYSTECTOMY N/A 08/27/2017   Procedure: LAPAROSCOPIC CHOLECYSTECTOMY WITH INTRAOPERATIVE CHOLANGIOGRAM;  Surgeon: Curvin Deward MOULD, MD;  Location: [facility];  Service: General;  Laterality: N/A;   DILATION AND CURETTAGE OF UTERUS     EXCISION OF BREAST BIOPSY Left 01/06/2024   Procedure: EXCISION OF BREAST BIOPSY;  Surgeon: Vanderbilt Debby, MD;  Location: [facility];  Service: General;  Laterality: Left;  LEFT NIPPLE BIOPSY   IR IMAGING GUIDED PORT INSERTION  07/15/2023   LUMBAR DISC SURGERY  1997   lumb lam   PORT-A-CATH REMOVAL Right 01/13/2024   Procedure: REMOVAL PORT-A-CATH;  Surgeon: Arelia Filippo, MD;  Location: [facility];  Service: Plastics;  Laterality: Right;   TUBAL LIGATION     Essure   Patient Active Problem List   Diagnosis Date Noted   Port-A-Cath in place 07/21/2023   Malignant neoplasm of overlapping sites of left female breast (HCC) 06/09/2023   Bilateral hearing loss 12/24/2022   Tinnitus of both ears 06/09/2020   Acute gastritis 08/03/2019   Chronic left hip pain 12/02/2017   Chronic back pain 12/02/2017   Class 1 obesity due to excess calories with body mass index (BMI) of 34.0  to 34.9 in adult 12/08/2014   Varicose veins of both legs with edema 12/08/2014   Insomnia 02/18/2012   MDD (recurrent major depressive disorder) in remission (HCC) 01/16/2012   TRANSAMINASES, SERUM, ELEVATED 05/27/2008   NEPHROLITHIASIS, HX OF 05/23/2008   Sleep apnea 05/30/2007   NECK PAIN, CHRONIC 02/25/2007   Chest pain 02/25/2007    REFERRING PROVIDER: Dr. Mackey Chad  REFERRING DIAG: Left axillary cording  THERAPY DIAG:  Stiffness of left shoulder, not elsewhere classified  Malignant neoplasm of overlapping sites of left breast in female, estrogen receptor positive (HCC)  Acute pain of left shoulder  Aftercare following surgery for  neoplasm  ONSET DATE: 01/26/2024  Rationale for Evaluation and Treatment: Rehabilitation  SUBJECTIVE                                                                                                                                                                                           SUBJECTIVE STATEMENT: The PA drained the seroma Wednesday and then the surgeon made a small slit at my incision to allow for more consistent drainage when I went back Friday. So I am having to keep it covered. He suggested I use period pads but I didn't have any skin tape so I've been using breast tape.   PERTINENT HISTORY: Patient was diagnosed in January 2025 with left grade 2 invasive ductal carcinoma breast cancer. It was ER/PR positive, HER2 negative with a Ki67 of 20%. She underwent neoadjuvant chemotherapy followed by a left lumpectomy and sentinel node biopsy (4 negative nodes) on 01/06/2024. She had a bilateral reduction and lift on 01/13/2024. She is soon to begin radiation and reports axillary cording.  PAIN:  Are you having pain? Yes NPRS scale: 1/10 Pain location: Left axilla incision Pain orientation: Left  PAIN TYPE: tight Pain description: intermittent  Aggravating factors: trying to reach overhead, having fluid drained Relieving factors: resting and advil   PRECAUTIONS: Other: Left arm lymphedema risk and recently completed chemotherapy  RED FLAGS: None   WEIGHT BEARING RESTRICTIONS: No  FALLS:  Has patient fallen in last 6 months? 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  COGNITION:  Overall  cognitive status: Within functional limits for tasks assessed   PALPATION: Very tender to palpation left lateral breast with warmth to touch, edema present, and redness (see photo).  OBSERVATIONS / OTHER ASSESSMENTS:  Left axillary cording visible and palpable. She reports feeling it into her lateral trunk but due to significant edema present today, PT is unable to see cording in that area.    POSTURE: Forward head and rounded shoulders posture  HAND DOMINANCE: Right  UPPER EXTREMITY AROM/PROM:  A/PROM RIGHT   eval   Shoulder extension 43  Shoulder flexion 125  Shoulder abduction 150  Shoulder internal rotation 58  Shoulder external rotation 87    (Blank rows = not tested)  A/PROM LEFT   eval  Shoulder extension 37  Shoulder flexion 96  Shoulder abduction 97  Shoulder internal rotation 65  Shoulder external rotation 72    (Blank rows = not tested)  UPPER EXTREMITY STRENGTH: WFL but left not tested due to pain   LYMPHEDEMA ASSESSMENTS:   SURGERY TYPE/DATE: Left lumpectomy and sentinel node biopsy 01/06/2024; breast reduction 01/13/2024  NUMBER OF LYMPH NODES REMOVED: 4  CHEMOTHERAPY: Neoadjuvant completed 12/01/2023  RADIATION:Upcoming  HORMONE TREATMENT: Upcoming  INFECTIONS: none  LYMPHEDEMA ASSESSMENTS:   LANDMARK RIGHT eval  Around trunk at axillary fold with arms by side   Around trunk 4 below sternal notch   10 cm proximal to olecranon process 28.2  Olecranon process 25  10 cm proximal to ulnar styloid process 22.2  Just proximal to ulnar styloid process 15.3  Across hand at thumb web space 18.5  At base of 2nd digit 6.1  (Blank rows = not tested)  LANDMARK LEFT  eval  Around trunk at axillary fold with arms by side   Around trunk 4 below sternal notch   10 cm proximal to olecranon process 28  Olecranon process 24.3  10  cm proximal to ulnar styloid process 21.4  Just proximal to ulnar styloid process 15  Across hand at thumb web space 18.5  At base of 2nd digit 5.9  (Blank rows = not tested)                                                                                                                              TREATMENT DATE:  02/09/24: Therapeutic Exercises Pulleys into flex and abd x 2 mins each returning therapist demo  Roll yellow ball up wall into flex x 10 and Lt UE abd x 10 Therapeutic Activities Supine over half foam roll for following: Bil UE horz abd, bil UE scaption into a V x 10 each, then bil UE abd in a snow angel x 10, 5 sec holds returning therapist demo for each Manual Therapy Issued 3 pieces of 1/2 gray foam for pt to wear: 1. Near axillary incision under bra over area of seroma (in TG soft), and other 2 (in Juzo cotton stockinette) at inferior aspect of compression bra where it has been  rolling up and being too tight on her skin, tso the foam can help alleviate some of the pressure.  P/ROM to Lt shoulder into flex, abd and D2 with scapular depression by therapist throughout, cuing throughout to relax shoulder due to muscle guarding MFR to area of cording in Lt axilla but gently as pts seroma is draining Removed 'breast tape' that pt had and changed dressing to non adherent dressing and fixated with paper tape as her skin was becoming red and irritated from the other tape she was using. Issued extra paper tape and more non adherent dressing and gauze. Gauze for if she starts having increased drainage but advised her to place non adherent dressing over skin first, not gauze.   02/05/24: Therapeutic Exercises Pulleys into flex and abd x 2 mins each returning therapist demo  Roll yellow ball up wall into flex x 10 and Lt UE abd x 5 Modified downward dog on wall x 5 reps, 5 sec holds returning demo Therapeutic Activities Supine over half foam roll for following: Bil UE horz abd, bil  UE scaption into a V x 10 each, then bil UE abd in a snow angel x 10, 5 sec holds returning therapist demo for each Manual Therapy P/ROM to Lt shoulder into flex, abd and D2 with scapular depression by therapist throughout, cuing throughout to relax shoulder due to muscle guarding MFR to area of cording in Lt axilla but gently as pts seroma is refilling  02/04/24: Therapeutic Exercises Pulleys into flex and abd x 2 mins each returning therapist demo  Roll yellow ball up wall into flex x 10 and Lt UE abd x 5 Modified downward dog on wall x 5 reps, 5 sec holds returning demo Manual Therapy P/ROM to Lt shoulder into flex, abd and D2 with scapular depression by therapist throughout, cuing throughout to relax shoulder due to muscle guarding MFR to area of cording in Lt axilla but gently as pts seroma is refilling  02/02/24: SOZO done for baseline and answered pts questions about this Manual Therapy P/ROM to Lt shoulder into flex, abd and D2 with scapular depression by therapist throughout, cuing throughout to relax shoulder due to muscle guarding MFR to area of cording in Lt axilla  STM to axilla where scar tissue palpable Neural tension stretch with good stretch felt by pt in cord     PATIENT EDUCATION:  Education details: Lymphedema precautions Person educated: Patient Education method: Programmer, multimedia, Demonstration, Verbal cues, and Handouts Education comprehension: verbalized understanding and returned demonstration  HOME EXERCISE PROGRAM: Active assist and active shoulder ROM: flexion, abduction, ER, and scapular retraction  ASSESSMENT:  CLINICAL IMPRESSION: Pt reports when she saw surgeon Friday he re opened small area of axillary incision and packed area with dressing and she is to replace bandage prn due to drainage until she returns to see him again on Oct 2. He suggested holding off on starting radiation until this heals. Continued with gentle stretching being mindful of open  incision due to seroma draining. Then changed dressing pt was using as surgeon didn't give her anything to cover this with and advised period pads which work but pt is using tape that is irritating her skin. So replaced this with non adherent dressing and issued some extra for her, and fixated with paper tape and issued some of this for her to have at home as well. Was able to express small amount from opening with gentle pressure on seroma and it was clear with no odor  so pt conts with no s/s of infection.   OBJECTIVE IMPAIRMENTS: decreased ROM, increased edema, increased fascial restrictions, impaired UE functional use, postural dysfunction, and pain.   ACTIVITY LIMITATIONS: carrying and reach over head  PARTICIPATION LIMITATIONS: cleaning, laundry, and community activity  PERSONAL FACTORS: Time since onset of injury/illness/exacerbation are also affecting patient's functional outcome.   REHAB POTENTIAL: Excellent  CLINICAL DECISION MAKING: Stable/uncomplicated  EVALUATION COMPLEXITY: Low  GOALS: Goals reviewed with patient? Yes  SHORT TERM GOALS = LONG TERM GOALS: Target date: 02/26/2024    Patient will be independent with her home exercise program for improving shoulder ROM and function. Goal status: INITIAL  2.  Patient will report >/= 50% reduction in left lateral breast and axillary pain to better tolerate daily activities. Goal status: INITIAL  3.  Patient will improve her DASH score to </= 8 for overall improved UE function   Goal status: INITIAL  4.  Patient will increase left shoulder flexion and abduction to >/= 120 degrees for increased ability to reach overhead. Goal status: INITIAL  5.  Patient will verbalize good understanding of lymphedema risk reduction practices. Goal status: INITIAL   PLAN:  PT FREQUENCY: 3x/week  PT DURATION: 4 weeks  PLANNED INTERVENTIONS: 02835- PT Re-evaluation, 97110-Therapeutic exercises, 97530- Therapeutic activity, 97112-  Neuromuscular re-education, 97535- Self Care, 02859- Manual therapy, 20560 (1-2 muscles), 20561 (3+ muscles)- Dry Needling, Patient/Family education, Manual lymph drainage, and Scar mobilization  PLAN FOR NEXT SESSION: Get appt at [facility] [place]? How was foam in bra? Cont PROM and manual techniques to improve cording, cont AA/ROM exs and progress to postural strength once able   Berwyn Knights, PTA 02/09/24 1:12 PM

## 2024-02-09 NOTE — Telephone Encounter (Signed)
 D7794, ICE COMPRESS: RANDOMIZED TRIAL OF LIMB CRYOCOMPRESSION VERSUS CONTINUOUS COMPRESSION VERSUS LOW CYCLIC COMPRESSION FOR THE PREVENTION  OF TAXANE-INDUCED PERIPHERAL NEUROPATHY   Left patient a voicemail she is due for 24 week assessment. Patient coming in tomorrow. We can complete her assessment in person between her scheduled appointments whenever possible.  Laury Quale, MPH  Clinical Research Coordinator

## 2024-02-09 NOTE — Telephone Encounter (Addendum)
 D7794, ICE COMPRESS: RANDOMIZED TRIAL OF LIMB CRYOCOMPRESSION VERSUS CONTINUOUS COMPRESSION VERSUS LOW CYCLIC COMPRESSION FOR THE PREVENTION  OF TAXANE-INDUCED PERIPHERAL NEUROPATHY   Spoke with patient via phone. Patient is aware if she has radiation appt tomorrow (tues 9.23) Research will see her to complete this study's 24 wk PRO's and neuro assessments. Patient mentioned her lymphedema and that she is unsure if she will continue radiation appointment depending on radiation care team decisions. Patient is aware if her appointment is rescheduled we will see her at a convenient time within study parameters. Patient has contact information if she has any questions. Will follow patient on study assessments.  Laury Quale, MPH  Clinical Research Coordinator

## 2024-02-09 NOTE — Progress Notes (Signed)
 Kelsey Carlson has completed her chemotherapy, had Left breast lumpectomy 01/06/2024, and breast reconstruction 01/13/2024.  She is seen today to discuss proceeding with radiation therapy.    Histology per Pathology Report:    Receptor Status: ER(positive), PR (positive), Her2-neu (negative), Ki-(20%)    Lymphedema issues, if any: Denies   Pain issues, if any: None     SAFETY ISSUES: Prior radiation? No Pacemaker/ICD? No Possible current pregnancy? Postmenopausal Is the patient on methotrexate? No  Current Complaints / other details:   -Port removal 8/26

## 2024-02-09 NOTE — Telephone Encounter (Signed)
 Patient called to report that general surgery is asking that they delay the start of radiation to the week of October 6th due to lymphedema and continued drainage.    Patient is scheduled to have new follow up and CT sim this week.  Routing to [employer], [place] to reach out to patient to advise if they should reschedule this weeks visit or if they can still proceed due to the change in potential radiation start date.

## 2024-02-10 ENCOUNTER — Ambulatory Visit: Admitting: Radiation Oncology

## 2024-02-10 ENCOUNTER — Ambulatory Visit
Admission: RE | Admit: 2024-02-10 | Discharge: 2024-02-10 | Disposition: A | Discharge status: Home / Self Care | Source: Ambulatory Visit | Attending: Radiation Oncology | Admitting: Radiation Oncology

## 2024-02-10 ENCOUNTER — Encounter: Payer: Self-pay | Admitting: Radiation Oncology

## 2024-02-10 DIAGNOSIS — Z1732 Human epidermal growth factor receptor 2 negative status: Secondary | ICD-10-CM | POA: Diagnosis not present

## 2024-02-10 DIAGNOSIS — Z87891 Personal history of nicotine dependence: Secondary | ICD-10-CM | POA: Diagnosis not present

## 2024-02-10 DIAGNOSIS — Z170 Estrogen receptor positive status [ER+]: Secondary | ICD-10-CM | POA: Insufficient documentation

## 2024-02-10 DIAGNOSIS — G4730 Sleep apnea, unspecified: Secondary | ICD-10-CM | POA: Diagnosis not present

## 2024-02-10 DIAGNOSIS — Z79899 Other long term (current) drug therapy: Secondary | ICD-10-CM | POA: Insufficient documentation

## 2024-02-10 DIAGNOSIS — Z87442 Personal history of urinary calculi: Secondary | ICD-10-CM | POA: Diagnosis not present

## 2024-02-10 DIAGNOSIS — Z1721 Progesterone receptor positive status: Secondary | ICD-10-CM | POA: Diagnosis not present

## 2024-02-10 DIAGNOSIS — M129 Arthropathy, unspecified: Secondary | ICD-10-CM | POA: Insufficient documentation

## 2024-02-10 DIAGNOSIS — K219 Gastro-esophageal reflux disease without esophagitis: Secondary | ICD-10-CM | POA: Insufficient documentation

## 2024-02-10 DIAGNOSIS — Z803 Family history of malignant neoplasm of breast: Secondary | ICD-10-CM | POA: Insufficient documentation

## 2024-02-10 DIAGNOSIS — C50812 Malignant neoplasm of overlapping sites of left female breast: Secondary | ICD-10-CM | POA: Diagnosis present

## 2024-02-10 NOTE — Research (Signed)
 D7794, ICE COMPRESS: RANDOMIZED TRIAL OF LIMB CRYOCOMPRESSION VERSUS CONTINUOUS COMPRESSION VERSUS LOW CYCLIC COMPRESSION FOR THE PREVENTION  OF TAXANE-INDUCED PERIPHERAL NEUROPATHY    Patient arrives today for Week 24 visit.    Week 12 Assessments:    PROs: Provided to patient at registration prior to her other appointments.  Collected and checked for completeness and accruracy. Patient changed one answer. Confirmed correct answer with patient, answer not chosen was marked through and initialed by this Coordinator.   Neuropathy Assessments: Neuropen, [place] and Timed Get Up and Go tests performed by Research Nurse Mazie Larsen, RN with assistance recording by Research Coordinator Laury Quale, MPH.    Supplemental Agents, Prescription Medications and Complementary Medicine:  Medication list reviewed with patient. She has not taken duloxetine or any vitamins or supplements for symptom management of peripheral neuropathy since starting on study.  Patient has not received any complementary or alternative therapies for symptom management of peripheral neuropathy since starting on study.    STUDY INTERVENTION & TOLERABILITY ASSESSMENTS:  Patient previously declined Paxman device. Patient not currently receiving Chemotherapy/Taxane drug.   PLAN:  The patient was thanked for their time . She understands research will meet her again at next scheduled assessment appointment this will be for the 52 wk assessment. Patient Lodema Parma. Vanderhoff has been provided direct contact information and is encouraged to contact this Coordinator for any needs or questions.   Laury Quale, MPH  Clinical Research Coordinator

## 2024-02-10 NOTE — Progress Notes (Signed)
 Radiation Oncology         [phone] ________________________________  Name: Kelsey Carlson        MRN: [id number]  Date of Service: 02/10/2024 DOB: 1966/03/22  RR:Azidnoz, Greig BRAVO, MD  Odean Potts, MD     REFERRING PHYSICIAN: Odean Potts, MD   DIAGNOSIS: The encounter diagnosis was Malignant neoplasm of overlapping sites of left breast in female, estrogen receptor positive (HCC).   HISTORY OF PRESENT ILLNESS: Kelsey Carlson is a 58 y.o. female seen for a left breast cancer. The patient presented after having bloody nipple discharge for approximately 6 months time.  She had mammography at her OB/GYN office and this was read as benign, and she was evaluated on 05/05/2023 by Dr. Vanderbilt.  She underwent an excisional punch biopsy in the office on 05/23/2023 consistent with an infiltrating carcinoma of breast origin, additional testing showed that this tumor was ER/PR positive, HER2 negative with a Ki-67 not performed.  She was counseled on an MRI of the breasts which was performed on 06/02/2023 and showed a 2.8 cm biopsy-proven left nipple/retroareolar mass measuring 2.8 cm and 5 cm of irregular enhancement in the upper outer left breast 2-1/2 cm away from the retroareolar/nipple findings, and highly suspicious breast enhancement spanning into the upper outer quadrant of 9.8 cm distance, no evidence of adenopathy was appreciated.  No findings that were abnormal of the right breast were noted either.  She met back with Dr. 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     Since her last visit,  Oncotype Dx score resulted with a score of 30.  She had a CT chest abdomen pelvis for staging showing her known left breast cancer and prominent left axillary lymph nodes.  No convincing evidence of metastatic disease was appreciated and bone scan on 06/23/2023 also confirmed this.  She proceeded with neoadjuvant chemotherapy between 07/21/2023 and  12/01/2023.  Her posttreatment MRI scan on 12/02/2023 showed a significant decrease in the masslike enhancement involving the left malar region with remaining enhancement within the nipple and the retroareolar focus measuring 4 mm.  Her lymph nodes appear normal.  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  Her tumor was ER/PR positive HER2 negative with a Ki-67 of 20%.  She proceeded with bilateral breast mammoplasty on 01/13/2024, and benign breast tissue on the left was noted as well as benign breast tissue on the right.  She has been healing well, and has had some mild drainage from her incision site.  She is seen today to consider additional plans for radiation.   PREVIOUS RADIATION THERAPY: No   PAST MEDICAL HISTORY:  Past Medical History:  Diagnosis Date   Arthritis    left hip   Cancer (HCC) 07/2023   left breast IMC   Gallstones    GERD (gastroesophageal reflux disease)    History of kidney stones    Lumbar back pain 06/28/2013   Sleep apnea    does not use CPAP   Urolithiasis    Wears glasses        PAST SURGICAL HISTORY: Past Surgical History:  Procedure Laterality Date   ANKLE ARTHRODESIS  1981   left ankle bone  tumor-   BLADDER SUSPENSION  2008   sling-cysto   breast biopsy  2002   lt br bx   BREAST BIOPSY  01/02/2024   MM LT RADIOACTIVE SEED EA ADD LESION LOC MAMMO GUIDE 01/02/2024 [facility]   BREAST BIOPSY  01/02/2024   MM LT RADIOACTIVE SEED LOC MAMMO GUIDE 01/02/2024 [facility]   BREAST CYST EXCISION Left 02/19/2013   Procedure:  EXCISION BREAST MASS;  Surgeon: Debby A. Cornett, MD;  Location: [facility];  Service: General;  Laterality: Left;   BREAST  EXCISIONAL BIOPSY Left 2014   benign   BREAST LUMPECTOMY WITH RADIOACTIVE SEED AND SENTINEL LYMPH NODE BIOPSY Left 01/06/2024   Procedure: BREAST LUMPECTOMY WITH RADIOACTIVE SEED AND SENTINEL LYMPH NODE BIOPSY;  Surgeon: Vanderbilt Debby, MD;  Location: [facility];  Service: General;  Laterality: Left;  BRACKETED LEFT BREAST SEED LUMPECTOMY LEFT SENTINEL LYMPH NODE MAPPING   BREAST REDUCTION SURGERY Bilateral 01/13/2024   Procedure: LEFT ONCOPLASTIC RECONSTRUCTION, RIGHT BREAST REDUCTION;  Surgeon: Arelia Filippo, MD;  Location: [facility];  Service: Plastics;  Laterality: Bilateral;   BREAST SURGERY     CHOLECYSTECTOMY N/A 08/27/2017   Procedure: LAPAROSCOPIC CHOLECYSTECTOMY WITH INTRAOPERATIVE CHOLANGIOGRAM;  Surgeon: Curvin Deward MOULD, MD;  Location: [facility];  Service: General;  Laterality: N/A;   DILATION AND CURETTAGE OF UTERUS     EXCISION OF BREAST BIOPSY Left 01/06/2024   Procedure: EXCISION OF BREAST BIOPSY;  Surgeon: Vanderbilt Debby, MD;  Location: [facility];  Service: General;  Laterality: Left;  LEFT NIPPLE BIOPSY   IR IMAGING GUIDED PORT INSERTION  07/15/2023   LUMBAR DISC SURGERY  1997   lumb lam   PORT-A-CATH REMOVAL Right 01/13/2024   Procedure: REMOVAL PORT-A-CATH;  Surgeon: Arelia Filippo, MD;  Location: [facility];  Service: Plastics;  Laterality: Right;   TUBAL LIGATION     Essure     FAMILY HISTORY:  Family History  Problem Relation Age of Onset   Breast cancer Mother    Hypertension Father    Prostate cancer Father      SOCIAL HISTORY:  reports that she quit smoking about 29 years ago. Her smoking use included cigarettes. She has never used smokeless tobacco. She reports current alcohol use. She reports that she does not use drugs.  The patient is married and lives in [place] . 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    ALLERGIES: Chlorhexidine    MEDICATIONS:   Current Outpatient Medications  Medication Sig Dispense Refill   albuterol  (VENTOLIN  HFA) 108 (90 Base) MCG/ACT inhaler Inhale 2 puffs into the lungs every 6 (six) hours as needed for wheezing or shortness of breath. 8 g 2   ibuprofen  (ADVIL ) 800 MG tablet Take 1 tablet (800 mg total) by mouth every 8 (eight) hours as needed. 30 tablet 0   metroNIDAZOLE  (METROCREAM ) 0.75 % cream Apply to face one to two times a day for rosacea. 45 g 11   naproxen  sodium (ALEVE ) 220 MG tablet Take 220 mg by mouth.     pantoprazole  (PROTONIX ) 40 MG tablet TAKE 1 TABLET BY MOUTH EVERY DAY 90 tablet 1   triamcinolone  ointment (KENALOG ) 0.5 % Apply 1 Application topically 2 (two) times daily. 30 g 0   No current facility-administered medications for this encounter.     REVIEW OF SYSTEMS: On review of systems, the patient reports that she is doing well overall. She feels like she has recovered  well from chemotherapy and is healing nicely but using of panty liner and soft tape to keep this covered at the site of her incision.     PHYSICAL EXAM:  Wt Readings from Last 3 Encounters:  02/10/24 (P) 171 lb 8 oz (77.8 kg)  01/29/24 172 lb 6.4 oz (78.2 kg)  01/28/24 172 lb 6.4 oz (78.2 kg)   Temp Readings from Last 3 Encounters:  02/10/24 (!) (P) 96.9 F (36.1 C) ((P) Temporal)  01/29/24 98.2 F (36.8 C) (Oral)  01/28/24 98.3 F (36.8 C) (Temporal)   BP Readings from Last 3 Encounters:  02/10/24 (P) 127/87  01/29/24 114/62  01/28/24 119/64   Pulse Readings from Last 3 Encounters:  02/10/24 (P) 76  01/29/24 84  01/28/24 83    In general this is a well appearing Caucasian female in no acute distress. She's alert and oriented x4 and appropriate throughout the examination. Cardiopulmonary assessment is negative for acute distress and she exhibits normal effort.  The left breast incision is well intact without erythema.  Very small separation is noted at the medial aspect of her incision site, and this is  redressed with gauze and Medipore tape.    ECOG =1  0 - Asymptomatic (Fully active, able to carry on all predisease activities without restriction)  1 - Symptomatic but completely ambulatory (Restricted in physically strenuous activity but ambulatory and able to carry out work of a light or sedentary nature. For example, light housework, office work)  2 - Symptomatic, <50% in bed during the day (Ambulatory and capable of all self care but unable to carry out any work activities. Up and about more than 50% of waking hours)  3 - Symptomatic, >50% in bed, but not bedbound (Capable of only limited self-care, confined to bed or chair 50% or more of waking hours)  4 - Bedbound (Completely disabled. Cannot carry on any self-care. Totally confined to bed or chair)  5 - Death   Raylene MM, Creech RH, Tormey DC, et al. [phone]). Toxicity and response criteria of the [employer]. Am. DOROTHA Bridges. Oncol. 5 (6): 649-55    LABORATORY DATA:  Lab Results  Component Value Date   WBC 4.5 12/01/2023   HGB 10.6 (L) 12/01/2023   HCT 31.5 (L) 12/01/2023   MCV 95.5 12/01/2023   PLT 330 12/01/2023   Lab Results  Component Value Date   NA 141 12/01/2023   K 3.9 12/01/2023   CL 110 12/01/2023   CO2 27 12/01/2023   Lab Results  Component Value Date   ALT 22 12/01/2023   AST 18 12/01/2023   ALKPHOS 61 12/01/2023   BILITOT 0.3 12/01/2023      RADIOGRAPHY: No results found.      IMPRESSION/PLAN: 1. 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 Dr. Dewey has recommended adjuvant radiotherapy and now that the patient has completed chemotherapy and is still having some healing we will delay today's planned simulation however we discussed that Dr. Dewey would recommend radiotherapy to the left breast with deep inspiration breath-hold technique in order to reduce local risks of  recurrence.  We discussed the risks, benefits, short, and long term effects of radiotherapy, as well as the curative intent.  I reviewed the the delivery and logistics of radiotherapy and that Dr. Dewey recommends 4 weeks to the left breast with deep inspiration breath-hold technique. Written consent is obtained and placed  in the chart, a copy was provided to the patient.  She will be rescheduled for simulation in the near future.   In a visit lasting 45 minutes, greater than 50% of the time was spent face to face reviewing her case, as well as in preparation of, discussing, and coordinating the patient's care.      Donald KYM Husband, [facility]    **Disclaimer: This note was dictated with voice recognition software. Similar sounding words can inadvertently be transcribed and this note may contain transcription errors which may not have been corrected upon publication of note.**

## 2024-02-11 ENCOUNTER — Ambulatory Visit

## 2024-02-11 DIAGNOSIS — Z170 Estrogen receptor positive status [ER+]: Secondary | ICD-10-CM

## 2024-02-11 DIAGNOSIS — Z483 Aftercare following surgery for neoplasm: Secondary | ICD-10-CM

## 2024-02-11 DIAGNOSIS — M25612 Stiffness of left shoulder, not elsewhere classified: Secondary | ICD-10-CM

## 2024-02-11 DIAGNOSIS — M25512 Pain in left shoulder: Secondary | ICD-10-CM

## 2024-02-11 NOTE — Therapy (Signed)
 OUTPATIENT PT  UPPER EXTREMITY LYMPHEDEMA TREATMENT  Patient Name: Kelsey Carlson MRN: [id number] DOB:Sep 20, 1965, 58 y.o., female Today's Date: 02/11/2024  END OF SESSION:  PT End of Session - 02/11/24 1205     Visit Number 6    Number of Visits 13    Date for Recertification  02/20/24    PT Start Time 1204    PT Stop Time 1258    PT Time Calculation (min) 54 min    Activity Tolerance Patient tolerated treatment well    Behavior During Therapy [facility] for tasks assessed/performed          Past Medical History:  Diagnosis Date   Arthritis    left hip   Cancer (HCC) 07/2023   left breast IMC   Gallstones    GERD (gastroesophageal reflux disease)    History of kidney stones    Lumbar back pain 06/28/2013   Sleep apnea    does not use CPAP   Urolithiasis    Wears glasses    Past Surgical History:  Procedure Laterality Date   ANKLE ARTHRODESIS  1981   left ankle bone tumor-   BLADDER SUSPENSION  2008   sling-cysto   breast biopsy  2002   lt br bx   BREAST BIOPSY  01/02/2024   MM LT RADIOACTIVE SEED EA ADD LESION LOC MAMMO GUIDE 01/02/2024 [facility]   BREAST BIOPSY  01/02/2024   MM LT RADIOACTIVE SEED LOC MAMMO GUIDE 01/02/2024 [facility]   BREAST CYST EXCISION Left 02/19/2013   Procedure:  EXCISION BREAST MASS;  Surgeon: Debby A. Cornett, MD;  Location: [facility];  Service: General;  Laterality: Left;   BREAST EXCISIONAL BIOPSY Left 2014   benign   BREAST LUMPECTOMY WITH RADIOACTIVE SEED AND SENTINEL LYMPH NODE BIOPSY Left 01/06/2024   Procedure: BREAST LUMPECTOMY WITH RADIOACTIVE SEED AND SENTINEL LYMPH NODE BIOPSY;  Surgeon: Vanderbilt Debby, MD;  Location: [facility];  Service: General;  Laterality: Left;  BRACKETED LEFT BREAST SEED LUMPECTOMY LEFT SENTINEL LYMPH NODE MAPPING   BREAST REDUCTION SURGERY Bilateral 01/13/2024   Procedure: LEFT ONCOPLASTIC RECONSTRUCTION, RIGHT BREAST REDUCTION;  Surgeon: Arelia Filippo,  MD;  Location: [facility];  Service: Plastics;  Laterality: Bilateral;   BREAST SURGERY     CHOLECYSTECTOMY N/A 08/27/2017   Procedure: LAPAROSCOPIC CHOLECYSTECTOMY WITH INTRAOPERATIVE CHOLANGIOGRAM;  Surgeon: Curvin Deward MOULD, MD;  Location[facility];  Service: General;  Laterality: N/A;   DILATION AND CURETTAGE OF UTERUS     EXCISION OF BREAST BIOPSY Left 01/06/2024   Procedure: EXCISION OF BREAST BIOPSY;  Surgeon: Vanderbilt Debby, MD;  Location: [facility];  Service: General;  Laterality: Left;  LEFT NIPPLE BIOPSY   IR IMAGING GUIDED PORT INSERTION  07/15/2023   LUMBAR DISC SURGERY  1997   lumb lam   PORT-A-CATH REMOVAL Right 01/13/2024   Procedure: REMOVAL PORT-A-CATH;  Surgeon: Arelia Filippo, MD;  Location: [facility];  Service: Plastics;  Laterality: Right;   TUBAL LIGATION     Essure   Patient Active Problem List   Diagnosis Date Noted   Port-A-Cath in place 07/21/2023   Malignant neoplasm of overlapping sites of left female breast (HCC) 06/09/2023   Bilateral hearing loss 12/24/2022   Tinnitus of both ears 06/09/2020   Acute gastritis 08/03/2019   Chronic left hip pain 12/02/2017   Chronic back pain 12/02/2017   Class 1 obesity due to excess calories with body mass index (BMI) of 34.0  to 34.9 in adult 12/08/2014   Varicose veins of both legs with edema 12/08/2014   Insomnia 02/18/2012   MDD (recurrent major depressive disorder) in remission 01/16/2012   TRANSAMINASES, SERUM, ELEVATED 05/27/2008   NEPHROLITHIASIS, HX OF 05/23/2008   Sleep apnea 05/30/2007   NECK PAIN, CHRONIC 02/25/2007   Chest pain 02/25/2007    REFERRING PROVIDER: Dr. Mackey Chad  REFERRING DIAG: Left axillary cording  THERAPY DIAG:  Stiffness of left shoulder, not elsewhere classified  Malignant neoplasm of overlapping sites of left breast in female, estrogen receptor positive (HCC)  Acute pain of left shoulder  Aftercare following surgery for  neoplasm  ONSET DATE: 01/26/2024  Rationale for Evaluation and Treatment: Rehabilitation  SUBJECTIVE                                                                                                                                                                                           SUBJECTIVE STATEMENT: I think the seroma is done draining. I saw the radiation doctor yesterday and we decided to wait 2 more weeks to start my radiation to let the area of the seroma heal up. So I'll have my simulation Oct. 6 and then I'll start radiation Oct 13. I've been wearing the foam in my bra and that seems to be helping.   PERTINENT HISTORY: Patient was diagnosed in January 2025 with left grade 2 invasive ductal carcinoma breast cancer. It was ER/PR positive, HER2 negative with a Ki67 of 20%. She underwent neoadjuvant chemotherapy followed by a left lumpectomy and sentinel node biopsy (4 negative nodes) on 01/06/2024. She had a bilateral reduction and lift on 01/13/2024. She is soon to begin radiation and reports axillary cording.  PAIN:  Are you having pain? No, not currently  PRECAUTIONS: Other: Left arm lymphedema risk and recently completed chemotherapy  RED FLAGS: None   WEIGHT BEARING RESTRICTIONS: No  FALLS:  Has patient fallen in last 6 months? 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  COGNITION:  Overall cognitive status: Within functional limits for tasks  assessed   PALPATION: Very tender to palpation left lateral breast with warmth to touch, edema present, and  redness (see photo).  OBSERVATIONS / OTHER ASSESSMENTS:  Left axillary cording visible and palpable. She reports feeling it into her lateral trunk but due to significant edema present today, PT is unable to see cording in that area.    POSTURE: Forward head and rounded shoulders posture  HAND DOMINANCE: Right  UPPER EXTREMITY AROM/PROM:  A/PROM RIGHT   eval   Shoulder extension 43  Shoulder flexion 125  Shoulder abduction 150  Shoulder internal rotation 58  Shoulder external rotation 87    (Blank rows = not tested)  A/PROM LEFT   eval  Shoulder extension 37  Shoulder flexion 96  Shoulder abduction 97  Shoulder internal rotation 65  Shoulder external rotation 72    (Blank rows = not tested)  UPPER EXTREMITY STRENGTH: WFL but left not tested due to pain   LYMPHEDEMA ASSESSMENTS:   SURGERY TYPE/DATE: Left lumpectomy and sentinel node biopsy 01/06/2024; breast reduction 01/13/2024  NUMBER OF LYMPH NODES REMOVED: 4  CHEMOTHERAPY: Neoadjuvant completed 12/01/2023  RADIATION:Upcoming  HORMONE TREATMENT: Upcoming  INFECTIONS: none  LYMPHEDEMA ASSESSMENTS:   LANDMARK RIGHT eval  Around trunk at axillary fold with arms by side   Around trunk 4 below sternal notch   10 cm proximal to olecranon process 28.2  Olecranon process 25  10 cm proximal to ulnar styloid process 22.2  Just proximal to ulnar styloid process 15.3  Across hand at thumb web space 18.5  At base of 2nd digit 6.1  (Blank rows = not tested)  LANDMARK LEFT  eval  Around trunk at axillary fold with arms by side   Around trunk 4 below sternal notch   10 cm proximal to olecranon process 28  Olecranon process 24.3  10 cm proximal to ulnar styloid process 21.4  Just proximal to ulnar styloid process 15  Across hand at thumb web space 18.5  At base of 2nd digit 5.9  (Blank rows = not  tested)                                                                                                                              TREATMENT DATE:  02/11/24: Therapeutic Exercises Roll yellow ball up wall into flex x 10 and Lt UE abd x 10 Pulleys into flex and abd x 2 mins each  Therapeutic Activities Modified downward dog on wall x 5 reps, 5 sec holds returning therapist demo Free Motion Machine 3# x 12 returning demo Manual Therapy P/ROM to Lt shoulder into flex, abd and D2 with scapular depression by therapist throughout, cuing throughout to relax though less so MFR to area of cording in Lt axilla but gently as pts seroma is healing  02/09/24: Therapeutic Exercises Pulleys into flex and abd x 2 mins each returning therapist demo  Roll yellow ball up wall into flex x 10 and Lt UE abd x 10 Therapeutic Activities Supine over half foam roll for following: Bil UE horz abd, bil UE scaption  into a V x 10 each, then bil UE abd in a snow angel x 10, 5 sec holds returning therapist demo for each Manual Therapy Issued 3 pieces of 1/2 gray foam for pt to wear: 1. Near axillary incision under bra over area of seroma (in TG soft), and other 2 (in Juzo cotton stockinette) at inferior aspect of compression bra where it has been rolling up and being too tight on her skin, tso the foam can help alleviate some of the pressure.  P/ROM to Lt shoulder into flex, abd and D2 with scapular depression by therapist throughout, cuing throughout to relax shoulder due to muscle guarding MFR to area of cording in Lt axilla but gently as pts seroma is draining Removed 'breast tape' that pt had and changed dressing to non adherent dressing and fixated with paper tape as her skin was becoming red and irritated from the other tape she was using. Issued extra paper tape and more non adherent dressing and gauze. Gauze for if she starts having increased drainage but advised her to place non adherent dressing over skin  first, not gauze.   02/05/24: Therapeutic Exercises Pulleys into flex and abd x 2 mins each returning therapist demo  Roll yellow ball up wall into flex x 10 and Lt UE abd x 5 Modified downward dog on wall x 5 reps, 5 sec holds returning demo Therapeutic Activities Supine over half foam roll for following: Bil UE horz abd, bil UE scaption into a V x 10 each, then bil UE abd in a snow angel x 10, 5 sec holds returning therapist demo for each Manual Therapy P/ROM to Lt shoulder into flex, abd and D2 with scapular depression by therapist throughout, cuing throughout to relax shoulder due to muscle guarding MFR to area of cording in Lt axilla but gently as pts seroma is refilling       PATIENT EDUCATION:  Education details: Lymphedema precautions Person educated: Patient Education method: Programmer, multimedia, Demonstration, Verbal cues, and Handouts Education comprehension: verbalized understanding and returned demonstration  HOME EXERCISE PROGRAM: Active assist and active shoulder ROM: flexion, abduction, ER, and scapular retraction  ASSESSMENT:  CLINICAL IMPRESSION: Pts incision where doctor opened to drain is healing well and has a small scab. Was careful with healing area of seroma with manual therapy to not create increased stretch where healing. Progressed her to include gentle postural strength which she tolerated well. Pts radiation will be on hold until early Oct to allow for more healing time at area of seroma.   OBJECTIVE IMPAIRMENTS: decreased ROM, increased edema, increased fascial restrictions, impaired UE functional use, postural dysfunction, and pain.   ACTIVITY LIMITATIONS: carrying and reach over head  PARTICIPATION LIMITATIONS: cleaning, laundry, and community activity  PERSONAL FACTORS: Time since onset of injury/illness/exacerbation are also affecting patient's functional outcome.   REHAB POTENTIAL: Excellent  CLINICAL DECISION MAKING:  Stable/uncomplicated  EVALUATION COMPLEXITY: Low  GOALS: Goals reviewed with patient? Yes  SHORT TERM GOALS = LONG TERM GOALS: Target date: 02/26/2024    Patient will be independent with her home exercise program for improving shoulder ROM and function. Goal status: INITIAL  2.  Patient will report >/= 50% reduction in left lateral breast and axillary pain to better tolerate daily activities. Goal status: INITIAL  3.  Patient will improve her DASH score to </= 8 for overall improved UE function   Goal status: INITIAL  4.  Patient will increase left shoulder flexion and abduction to >/= 120 degrees for increased  ability to reach overhead. Goal status: INITIAL  5.  Patient will verbalize good understanding of lymphedema risk reduction practices. Goal status: INITIAL   PLAN:  PT FREQUENCY: 3x/week  PT DURATION: 4 weeks  PLANNED INTERVENTIONS: 02835- PT Re-evaluation, 97110-Therapeutic exercises, 97530- Therapeutic activity, 97112- Neuromuscular re-education, 97535- Self Care, 02859- Manual therapy, 20560 (1-2 muscles), 20561 (3+ muscles)- Dry Needling, Patient/Family education, Manual lymph drainage, and Scar mobilization  PLAN FOR NEXT SESSION: Get appt at [facility] [place]? Cont PROM and manual techniques to improve cording, cont AA/ROM exs and progress to postural strength once able   Berwyn Knights, PTA 02/11/24 1:11 PM

## 2024-02-13 ENCOUNTER — Ambulatory Visit

## 2024-02-13 DIAGNOSIS — C50812 Malignant neoplasm of overlapping sites of left female breast: Secondary | ICD-10-CM

## 2024-02-13 DIAGNOSIS — M25512 Pain in left shoulder: Secondary | ICD-10-CM | POA: Diagnosis not present

## 2024-02-13 DIAGNOSIS — M25612 Stiffness of left shoulder, not elsewhere classified: Secondary | ICD-10-CM

## 2024-02-13 DIAGNOSIS — Z483 Aftercare following surgery for neoplasm: Secondary | ICD-10-CM

## 2024-02-13 NOTE — Therapy (Signed)
 OUTPATIENT PT  UPPER EXTREMITY LYMPHEDEMA TREATMENT  Patient Name: Kelsey Carlson MRN: [id number] DOB:1965-10-17, 58 y.o., female Today's Date: 02/13/2024  END OF SESSION:  PT End of Session - 02/13/24 0917     Visit Number 7    Number of Visits 13    Date for Recertification  02/20/24    PT Start Time 0906    PT Stop Time 1000    PT Time Calculation (min) 54 min    Activity Tolerance Patient tolerated treatment well    Behavior During Therapy [facility] for tasks assessed/performed          Past Medical History:  Diagnosis Date   Arthritis    left hip   Cancer (HCC) 07/2023   left breast IMC   Gallstones    GERD (gastroesophageal reflux disease)    History of kidney stones    Lumbar back pain 06/28/2013   Sleep apnea    does not use CPAP   Urolithiasis    Wears glasses    Past Surgical History:  Procedure Laterality Date   ANKLE ARTHRODESIS  1981   left ankle bone tumor-   BLADDER SUSPENSION  2008   sling-cysto   breast biopsy  2002   lt br bx   BREAST BIOPSY  01/02/2024   MM LT RADIOACTIVE SEED EA ADD LESION LOC MAMMO GUIDE 01/02/2024 [facility] MAMMOGRAPHY   BREAST BIOPSY  01/02/2024   MM LT RADIOACTIVE SEED LOC MAMMO GUIDE 01/02/2024 [facility] MAMMOGRAP[facility]   BREAST CYST EXCISION Left 02/19/2013   Procedure:  EXCISION BREAST MASS;  Surgeon: Debby A. Cornett, MD;  Location: [facility];  Service: General;  Laterality: Left;   BREAST EXCISIONAL BIOPSY Left 2014   benign   BREAST LUMPECTOMY WITH RADIOACTIVE SEED AND SENTINEL LYMPH NODE BIOPSY Left 01/06/2024   Procedure: BREAST LUMPECTOMY WITH RADIOACTIVE SEED AND SENTINEL LYMPH NODE BIOPSY;  Surgeon: Vanderbilt Debby, MD;  [facility]: [facility];  Service: General;  Laterality: Left;  BRACKETED LEFT BREAST SEED LUMPECTOMY LEFT SENTINEL LYMPH NODE MAPPING   BREAST REDUCTION SURGERY Bilateral 01/13/2024   Procedure: LEFT ONCOPLASTIC RECONSTRUCTION, RIGHT BREAST REDUCTION;  Surgeon: Arelia Filippo,  MD;  Location: [facility];  Service: Plastics;  Laterality: Bilateral;   BREAST SURGERY     CHOLECYSTECTOMY N/A 08/27/2017   Procedure: LAPAROSCOPIC CHOLECYSTECTOMY WITH INTRAOPERATIVE CHOLANGIOGRAM;  Surgeon: Curvin Deward MOULD, MD;  Location: [facility];  Service: General;  Laterality: N/A;   DILATION AND CURETTAGE OF UTERUS     EXCISION OF BREAST BIOPSY Left 01/06/2024   Procedure: EXCISION OF BREAST BIOPSY;  Surgeon: Vanderbilt Debby, MD;  Location: [facility];  Service: General;  Laterality: Left;  LEFT NIPPLE BIOPSY   IR IMAGING GUIDED PORT INSERTION  07/15/2023   LUMBAR DISC SURGERY  1997   lumb lam   PORT-A-CATH REMOVAL Right 01/13/2024   Procedure: REMOVAL PORT-A-CATH;  Surgeon: Arelia Filippo, MD;  Location: [facility];  Service: Plastics;  Laterality: Right;   TUBAL LIGATION     Essure   Patient Active Problem List   Diagnosis Date Noted   Port-A-Cath in place 07/21/2023   Malignant neoplasm of overlapping sites of left female breast (HCC) 06/09/2023   Bilateral hearing loss 12/24/2022   Tinnitus of both ears 06/09/2020   Acute gastritis 08/03/2019   Chronic left hip pain 12/02/2017   Chronic back pain 12/02/2017   Class 1 obesity due to excess calories with body mass index (BMI) of 34.0  to 34.9 in adult 12/08/2014   Varicose veins of both legs with edema 12/08/2014   Insomnia 02/18/2012   MDD (recurrent major depressive disorder) in remission 01/16/2012   TRANSAMINASES, SERUM, ELEVATED 05/27/2008   NEPHROLITHIASIS, HX OF 05/23/2008   Sleep apnea 05/30/2007   NECK PAIN, CHRONIC 02/25/2007   Chest pain 02/25/2007    REFERRING PROVIDER: Dr. Mackey Chad  REFERRING DIAG: Left axillary cording  THERAPY DIAG:  Stiffness of left shoulder, not elsewhere classified  Malignant neoplasm of overlapping sites of left breast in female, estrogen receptor positive (HCC)  Acute pain of left shoulder  Aftercare following surgery for  neoplasm  ONSET DATE: 01/26/2024  Rationale for Evaluation and Treatment: Rehabilitation  SUBJECTIVE                                                                                                                                                                                           SUBJECTIVE STATEMENT: The incision is healing well, it hasn't drained any more. It is a little red again though today so I don't know if it's filling up again under the skin. I'm wearing the foam in my bra you gave me and that is helping to make the bra more comfortable. I called [facility] to make an appt and they told me I needed a script from my doctor first.   PERTINENT HISTORY: Patient was diagnosed in January 2025 with left grade 2 invasive ductal carcinoma breast cancer. It was ER/PR positive, HER2 negative with a Ki67 of 20%. She underwent neoadjuvant chemotherapy followed by a left lumpectomy and sentinel node biopsy (4 negative nodes) on 01/06/2024. She had a bilateral reduction and lift on 01/13/2024. She is soon to begin radiation and reports axillary cording.  PAIN:  Are you having pain? No, not currently  PRECAUTIONS: Other: Left arm lymphedema risk and recently completed chemotherapy  RED FLAGS: None   WEIGHT BEARING RESTRICTIONS: No  FALLS:  Has patient fallen in last 6 months? 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  COGNITION:  Overall cognitive status: Within functional  limits for tasks assessed   PALPATION: Very tender to palpation left  lateral breast with warmth to touch, edema present, and redness (see photo).  OBSERVATIONS / OTHER ASSESSMENTS:  Left axillary cording visible and palpable. She reports feeling it into her lateral trunk but due to significant edema present today, PT is unable to see cording in that area.    POSTURE: Forward head and rounded shoulders posture  HAND DOMINANCE: Right  UPPER EXTREMITY AROM/PROM:  A/PROM RIGHT   eval   Shoulder extension 43  Shoulder flexion 125  Shoulder abduction 150  Shoulder internal rotation 58  Shoulder external rotation 87    (Blank rows = not tested)  A/PROM LEFT   eval  Shoulder extension 37  Shoulder flexion 96  Shoulder abduction 97  Shoulder internal rotation 65  Shoulder external rotation 72    (Blank rows = not tested)  UPPER EXTREMITY STRENGTH: WFL but left not tested due to pain   LYMPHEDEMA ASSESSMENTS:   SURGERY TYPE/DATE: Left lumpectomy and sentinel node biopsy 01/06/2024; breast reduction 01/13/2024  NUMBER OF LYMPH NODES REMOVED: 4  CHEMOTHERAPY: Neoadjuvant completed 12/01/2023  RADIATION:Upcoming  HORMONE TREATMENT: Upcoming  INFECTIONS: none  LYMPHEDEMA ASSESSMENTS:   LANDMARK RIGHT eval  Around trunk at axillary fold with arms by side   Around trunk 4 below sternal notch   10 cm proximal to olecranon process 28.2  Olecranon process 25  10 cm proximal to ulnar styloid process 22.2  Just proximal to ulnar styloid process 15.3  Across hand at thumb web space 18.5  At base of 2nd digit 6.1  (Blank rows = not tested)  LANDMARK LEFT  eval  Around trunk at axillary fold with arms by side   Around trunk 4 below sternal notch   10 cm proximal to olecranon process 28  Olecranon process 24.3  10 cm proximal to ulnar styloid process 21.4  Just proximal to ulnar styloid process 15  Across hand at thumb web space 18.5  At base of 2nd digit 5.9  (Blank  rows = not tested)                                                                                                                              TREATMENT DATE:  02/13/24: Therapeutic Exercises Pulleys into flex and abd x 2 mins each Roll yellow ball up wall into flex x 10 and Lt UE abd x 10 Therapeutic Activities Supine over half foam roll for following: Bil UE scaption in a V x 12 each, bil UE abd in a snow angel x 10, 5 sec holds Modified downward dog on wall x 5 reps, 5 sec holds returning therapist demo Free Motion Machine 3# x 15, then bil UE ext 3# x 12 returning demo Manual Therapy P/ROM to Lt shoulder into flex, abd and D2 with scapular depression by therapist throughout MFR to area of cording in Lt axilla but gently as pts seroma is healing, one very small pop noted in cording during MFR  02/11/24: Therapeutic  Exercises Roll yellow ball up wall into flex x 10 and Lt UE abd x 10 Pulleys into flex and abd x 2 mins each  Therapeutic Activities Modified downward dog on wall x 5 reps, 5 sec holds returning therapist demo Free Motion Machine 3# x 12 returning demo Manual Therapy P/ROM to Lt shoulder into flex, abd and D2 with scapular depression by therapist throughout, cuing throughout to relax though less so MFR to area of cording in Lt axilla but gently as pts seroma is healing  02/09/24: Therapeutic Exercises Pulleys into flex and abd x 2 mins each returning therapist demo  Roll yellow ball up wall into flex x 10 and Lt UE abd x 10 Therapeutic Activities Supine over half foam roll for following: Bil UE horz abd, bil UE scaption into a V x 10 each, then bil UE abd in a snow angel x 10, 5 sec holds returning therapist demo for each Manual Therapy Issued 3 pieces of 1/2 gray foam for pt to wear: 1. Near axillary incision under bra over area of seroma (in TG soft), and other 2 (in Juzo cotton stockinette) at inferior aspect of compression bra where it has been rolling up  and being too tight on her skin, tso the foam can help alleviate some of the pressure.  P/ROM to Lt shoulder into flex, abd and D2 with scapular depression by therapist throughout, cuing throughout to relax shoulder due to muscle guarding MFR to area of cording in Lt axilla but gently as pts seroma is draining Removed 'breast tape' that pt had and changed dressing to non adherent dressing and fixated with paper tape as her skin was becoming red and irritated from the other tape she was using. Issued extra paper tape and more non adherent dressing and gauze. Gauze for if she starts having increased drainage but advised her to place non adherent dressing over skin first, not gauze.        PATIENT EDUCATION:  Education details: Lymphedema precautions Person educated: Patient Education method: Programmer, multimedia, Demonstration, Verbal cues, and Handouts Education comprehension: verbalized understanding and returned demonstration  HOME EXERCISE PROGRAM: Active assist and active shoulder ROM: flexion, abduction, ER, and scapular retraction  ASSESSMENT:  CLINICAL IMPRESSION: Continued with AA/A/P/ROM exercises working to improve end Lt shoulder ROM as much as able before radiation begins in 2 weeks. Also progressed postural strength gently with light weights. Issued script for compression bra for pt to take to [facility] when she gets an appt. Incision area is slightly red but does not appear to be infected.  OBJECTIVE IMPAIRMENTS: decreased ROM, increased edema, increased fascial restrictions, impaired UE functional use, postural dysfunction, and pain.   ACTIVITY LIMITATIONS: carrying and reach over head  PARTICIPATION LIMITATIONS: cleaning, laundry, and community activity  PERSONAL FACTORS: Time since onset of injury/illness/exacerbation are also affecting patient's functional outcome.   REHAB POTENTIAL: Excellent  CLINICAL DECISION MAKING: Stable/uncomplicated  EVALUATION COMPLEXITY:  Low  GOALS: Goals reviewed with patient? Yes  SHORT TERM GOALS = LONG TERM GOALS: Target date: 02/26/2024    Patient will be independent with her home exercise program for improving shoulder ROM and function. Goal status: INITIAL  2.  Patient will report >/= 50% reduction in left lateral breast and axillary pain to better tolerate daily activities. Goal status: INITIAL  3.  Patient will improve her DASH score to </= 8 for overall improved UE function   Goal status: INITIAL  4.  Patient will increase left shoulder flexion and  abduction to >/= 120 degrees for increased ability to reach overhead. Goal status: INITIAL  5.  Patient will verbalize good understanding of lymphedema risk reduction practices. Goal status: INITIAL   PLAN:  PT FREQUENCY: 3x/week  PT DURATION: 4 weeks  PLANNED INTERVENTIONS: 02835- PT Re-evaluation, 97110-Therapeutic exercises, 97530- Therapeutic activity, 97112- Neuromuscular re-education, 97535- Self Care, 02859- Manual therapy, 20560 (1-2 muscles), 20561 (3+ muscles)- Dry Needling, Patient/Family education, Manual lymph drainage, and Scar mobilization  PLAN FOR NEXT SESSION: Get appt at [facility]? Cont PROM and manual techniques to improve cording, cont AA/ROM exs and progress to postural strength once able   Berwyn Knights, PTA 02/13/24 12:05 PM

## 2024-02-17 ENCOUNTER — Encounter: Payer: Self-pay | Admitting: *Deleted

## 2024-02-17 DIAGNOSIS — Z170 Estrogen receptor positive status [ER+]: Secondary | ICD-10-CM

## 2024-02-18 ENCOUNTER — Ambulatory Visit
Admission: RE | Admit: 2024-02-18 | Discharge: 2024-02-18 | Disposition: A | Discharge status: Home / Self Care | Source: Ambulatory Visit | Attending: Radiation Oncology | Admitting: Radiation Oncology

## 2024-02-18 DIAGNOSIS — Z510 Encounter for antineoplastic radiation therapy: Secondary | ICD-10-CM | POA: Insufficient documentation

## 2024-02-18 DIAGNOSIS — C50812 Malignant neoplasm of overlapping sites of left female breast: Secondary | ICD-10-CM | POA: Diagnosis present

## 2024-02-19 NOTE — Progress Notes (Signed)
   PROVIDER:  DEBBY CURTISTINE SHIPPER, MD  MRN: [id number] DOB: 24-Jun-1965 DATE OF ENCOUNTER: 02/19/2024 Subjective     Chief Complaint: Post Operative Visit     History of Present Illness: Kelsey Carlson is a 58 y.o. female who is seen today for recheck of left axillary incision.  The redness and pain are gone.  The drainage is stopped and her packing is out.  She feels well..     Review of Systems: A complete review of systems was obtained from the patient.  I have reviewed this information and discussed as appropriate with the patient.  See HPI as well for other ROS.      Medical History: History reviewed. No pertinent past medical history.  There is no problem list on file for this patient.   Past Surgical History:  Procedure Laterality Date  . CHOLECYSTECTOMY    . MASTECTOMY PARTIAL / LUMPECTOMY       Allergies  Allergen Reactions  . Chlorhexidine  Hives    Per pt's  report.    Current Outpatient Medications on File Prior to Visit  Medication Sig Dispense Refill  . triamcinolone  0.5 % ointment Apply 1 Application topically 2 (two) times daily     No current facility-administered medications on file prior to visit.    Family History  Problem Relation Age of Onset  . Skin cancer Mother   . Breast cancer Mother   . Skin cancer Father      Social History   Tobacco Use  Smoking Status Never  Smokeless Tobacco Never     Social History   Socioeconomic History  . Marital status: Married  Tobacco Use  . Smoking status: Never  . Smokeless tobacco: Never  Substance and Sexual Activity  . Alcohol use: Defer  . Drug use: Defer  . Sexual activity: Defer   Social Drivers of Health   Food Insecurity: No Food Insecurity (02/10/2024)   [facility]   Hunger Vital Sign   . Within the past 12 months, you worried that your food would run out before you got the money to buy more.: Never true   . Within the past 12 months, the food you bought just  didn't last and you didn't have money to get more.: Never true  Transportation Needs: No Transportation Needs (02/10/2024)   Received from [facility] - Transportation   . In the past 12 months, has lack of transportation kept you from medical appointments or from getting medications?: No   . In the past 12 months, has lack of transportation kept you from meetings, work, or from getting things needed for daily living?: No  Housing Stability: Unknown (05/23/2023)   Housing Stability Vital Sign   . Homeless in the Last Year: No    Objective:    Vitals:   02/19/24 1029  PainSc: 0-No pain    There is no height or weight on file to calculate BMI.           Assessment and Plan:     Diagnoses and all orders for this visit:  Post-operative state  Area of seroma infection left axilla has resolved.  Follow-up 3 months.  Call with questions or concerns.  No restrictions from my end.    No follow-ups on file.   DEBBY CURTISTINE SHIPPER, MD

## 2024-03-04 ENCOUNTER — Other Ambulatory Visit: Payer: Self-pay

## 2024-03-04 ENCOUNTER — Ambulatory Visit
Admission: RE | Admit: 2024-03-04 | Discharge: 2024-03-04 | Disposition: A | Discharge status: Home / Self Care | Source: Ambulatory Visit | Attending: Radiation Oncology | Admitting: Radiation Oncology

## 2024-03-04 DIAGNOSIS — C50812 Malignant neoplasm of overlapping sites of left female breast: Secondary | ICD-10-CM | POA: Diagnosis not present

## 2024-03-04 LAB — RAD ONC ARIA SESSION SUMMARY
Course Elapsed Days: 0
Plan Fractions Treated to Date: 1
Plan Prescribed Dose Per Fraction: 2.66 Gy
Plan Total Fractions Prescribed: 16
Plan Total Prescribed Dose: 42.56 Gy
Reference Point Dosage Given to Date: 2.66 Gy
Reference Point Session Dosage Given: 2.66 Gy
Session Number: 1

## 2024-03-05 ENCOUNTER — Ambulatory Visit
Admission: RE | Admit: 2024-03-05 | Discharge: 2024-03-05 | Disposition: A | Discharge status: Home / Self Care | Source: Ambulatory Visit | Attending: Radiation Oncology | Admitting: Radiation Oncology

## 2024-03-05 ENCOUNTER — Other Ambulatory Visit: Payer: Self-pay

## 2024-03-05 DIAGNOSIS — C50812 Malignant neoplasm of overlapping sites of left female breast: Secondary | ICD-10-CM | POA: Diagnosis not present

## 2024-03-05 LAB — RAD ONC ARIA SESSION SUMMARY
Course Elapsed Days: 1
Plan Fractions Treated to Date: 2
Plan Prescribed Dose Per Fraction: 2.66 Gy
Plan Total Fractions Prescribed: 16
Plan Total Prescribed Dose: 42.56 Gy
Reference Point Dosage Given to Date: 5.32 Gy
Reference Point Session Dosage Given: 2.66 Gy
Session Number: 2

## 2024-03-05 MED ORDER — RADIAPLEXRX EX GEL: Freq: Once | CUTANEOUS | Status: AC

## 2024-03-05 MED ORDER — ALRA NON-METALLIC DEODORANT (RAD-ONC)
1.0000 | Freq: Once | TOPICAL | Status: AC
  Administered 2024-03-05: 1 via TOPICAL

## 2024-03-08 ENCOUNTER — Other Ambulatory Visit: Payer: Self-pay

## 2024-03-08 ENCOUNTER — Ambulatory Visit
Admission: RE | Admit: 2024-03-08 | Discharge: 2024-03-08 | Disposition: A | Discharge status: Home / Self Care | Source: Ambulatory Visit | Attending: Radiation Oncology | Admitting: Radiation Oncology

## 2024-03-08 DIAGNOSIS — C50812 Malignant neoplasm of overlapping sites of left female breast: Secondary | ICD-10-CM | POA: Diagnosis not present

## 2024-03-08 LAB — RAD ONC ARIA SESSION SUMMARY
Course Elapsed Days: 4
Plan Fractions Treated to Date: 3
Plan Prescribed Dose Per Fraction: 2.66 Gy
Plan Total Fractions Prescribed: 16
Plan Total Prescribed Dose: 42.56 Gy
Reference Point Dosage Given to Date: 7.98 Gy
Reference Point Session Dosage Given: 2.66 Gy
Session Number: 3

## 2024-03-09 ENCOUNTER — Ambulatory Visit
Admission: RE | Admit: 2024-03-09 | Discharge: 2024-03-09 | Disposition: A | Discharge status: Home / Self Care | Source: Ambulatory Visit | Attending: Radiation Oncology | Admitting: Radiation Oncology

## 2024-03-09 ENCOUNTER — Other Ambulatory Visit: Payer: Self-pay

## 2024-03-09 DIAGNOSIS — C50812 Malignant neoplasm of overlapping sites of left female breast: Secondary | ICD-10-CM | POA: Diagnosis not present

## 2024-03-09 LAB — RAD ONC ARIA SESSION SUMMARY
Course Elapsed Days: 5
Plan Fractions Treated to Date: 4
Plan Prescribed Dose Per Fraction: 2.66 Gy
Plan Total Fractions Prescribed: 16
Plan Total Prescribed Dose: 42.56 Gy
Reference Point Dosage Given to Date: 10.64 Gy
Reference Point Session Dosage Given: 2.66 Gy
Session Number: 4

## 2024-03-10 ENCOUNTER — Other Ambulatory Visit: Payer: Self-pay

## 2024-03-10 ENCOUNTER — Ambulatory Visit
Admission: RE | Admit: 2024-03-10 | Discharge: 2024-03-10 | Disposition: A | Discharge status: Home / Self Care | Source: Ambulatory Visit | Attending: Radiation Oncology | Admitting: Radiation Oncology

## 2024-03-10 DIAGNOSIS — C50812 Malignant neoplasm of overlapping sites of left female breast: Secondary | ICD-10-CM | POA: Diagnosis not present

## 2024-03-10 LAB — RAD ONC ARIA SESSION SUMMARY
Course Elapsed Days: 6
Plan Fractions Treated to Date: 5
Plan Prescribed Dose Per Fraction: 2.66 Gy
Plan Total Fractions Prescribed: 16
Plan Total Prescribed Dose: 42.56 Gy
Reference Point Dosage Given to Date: 13.3 Gy
Reference Point Session Dosage Given: 2.66 Gy
Session Number: 5

## 2024-03-11 ENCOUNTER — Ambulatory Visit
Admission: RE | Admit: 2024-03-11 | Discharge: 2024-03-11 | Disposition: A | Discharge status: Home / Self Care | Source: Ambulatory Visit | Attending: Radiation Oncology | Admitting: Radiation Oncology

## 2024-03-11 ENCOUNTER — Other Ambulatory Visit: Payer: Self-pay

## 2024-03-11 DIAGNOSIS — C50812 Malignant neoplasm of overlapping sites of left female breast: Secondary | ICD-10-CM | POA: Diagnosis not present

## 2024-03-11 LAB — RAD ONC ARIA SESSION SUMMARY
Course Elapsed Days: 7
Plan Fractions Treated to Date: 6
Plan Prescribed Dose Per Fraction: 2.66 Gy
Plan Total Fractions Prescribed: 16
Plan Total Prescribed Dose: 42.56 Gy
Reference Point Dosage Given to Date: 15.96 Gy
Reference Point Session Dosage Given: 2.66 Gy
Session Number: 6

## 2024-03-12 ENCOUNTER — Other Ambulatory Visit: Payer: Self-pay

## 2024-03-12 ENCOUNTER — Ambulatory Visit
Admission: RE | Admit: 2024-03-12 | Discharge: 2024-03-12 | Disposition: A | Discharge status: Home / Self Care | Source: Ambulatory Visit | Attending: Radiation Oncology | Admitting: Radiation Oncology

## 2024-03-12 DIAGNOSIS — C50812 Malignant neoplasm of overlapping sites of left female breast: Secondary | ICD-10-CM | POA: Diagnosis not present

## 2024-03-12 LAB — RAD ONC ARIA SESSION SUMMARY
Course Elapsed Days: 8
Plan Fractions Treated to Date: 7
Plan Prescribed Dose Per Fraction: 2.66 Gy
Plan Total Fractions Prescribed: 16
Plan Total Prescribed Dose: 42.56 Gy
Reference Point Dosage Given to Date: 18.62 Gy
Reference Point Session Dosage Given: 2.66 Gy
Session Number: 7

## 2024-03-15 ENCOUNTER — Other Ambulatory Visit: Payer: Self-pay

## 2024-03-15 ENCOUNTER — Ambulatory Visit
Admission: RE | Admit: 2024-03-15 | Discharge: 2024-03-15 | Disposition: A | Discharge status: Home / Self Care | Source: Ambulatory Visit | Attending: Radiation Oncology | Admitting: Radiation Oncology

## 2024-03-15 DIAGNOSIS — C50812 Malignant neoplasm of overlapping sites of left female breast: Secondary | ICD-10-CM | POA: Diagnosis not present

## 2024-03-15 LAB — RAD ONC ARIA SESSION SUMMARY
Course Elapsed Days: 11
Plan Fractions Treated to Date: 8
Plan Prescribed Dose Per Fraction: 2.66 Gy
Plan Total Fractions Prescribed: 16
Plan Total Prescribed Dose: 42.56 Gy
Reference Point Dosage Given to Date: 21.28 Gy
Reference Point Session Dosage Given: 2.66 Gy
Session Number: 8

## 2024-03-16 ENCOUNTER — Other Ambulatory Visit: Payer: Self-pay

## 2024-03-16 ENCOUNTER — Ambulatory Visit
Admission: RE | Admit: 2024-03-16 | Discharge: 2024-03-16 | Disposition: A | Discharge status: Home / Self Care | Source: Ambulatory Visit | Attending: Radiation Oncology | Admitting: Radiation Oncology

## 2024-03-16 DIAGNOSIS — C50812 Malignant neoplasm of overlapping sites of left female breast: Secondary | ICD-10-CM | POA: Diagnosis not present

## 2024-03-16 LAB — RAD ONC ARIA SESSION SUMMARY
Course Elapsed Days: 12
Plan Fractions Treated to Date: 9
Plan Prescribed Dose Per Fraction: 2.66 Gy
Plan Total Fractions Prescribed: 16
Plan Total Prescribed Dose: 42.56 Gy
Reference Point Dosage Given to Date: 23.94 Gy
Reference Point Session Dosage Given: 2.66 Gy
Session Number: 9

## 2024-03-17 ENCOUNTER — Ambulatory Visit

## 2024-03-18 ENCOUNTER — Other Ambulatory Visit: Payer: Self-pay

## 2024-03-18 ENCOUNTER — Ambulatory Visit
Admission: RE | Admit: 2024-03-18 | Discharge: 2024-03-18 | Disposition: A | Discharge status: Home / Self Care | Source: Ambulatory Visit | Attending: Radiation Oncology | Admitting: Radiation Oncology

## 2024-03-18 DIAGNOSIS — C50812 Malignant neoplasm of overlapping sites of left female breast: Secondary | ICD-10-CM | POA: Diagnosis not present

## 2024-03-18 LAB — RAD ONC ARIA SESSION SUMMARY
Course Elapsed Days: 14
Plan Fractions Treated to Date: 10
Plan Prescribed Dose Per Fraction: 2.66 Gy
Plan Total Fractions Prescribed: 16
Plan Total Prescribed Dose: 42.56 Gy
Reference Point Dosage Given to Date: 26.6 Gy
Reference Point Session Dosage Given: 2.66 Gy
Session Number: 10

## 2024-03-19 ENCOUNTER — Ambulatory Visit
Admission: RE | Admit: 2024-03-19 | Discharge: 2024-03-19 | Disposition: A | Discharge status: Home / Self Care | Source: Ambulatory Visit | Attending: Radiation Oncology | Admitting: Radiation Oncology

## 2024-03-19 ENCOUNTER — Ambulatory Visit: Admitting: Radiation Oncology

## 2024-03-19 ENCOUNTER — Other Ambulatory Visit: Payer: Self-pay

## 2024-03-19 DIAGNOSIS — C50812 Malignant neoplasm of overlapping sites of left female breast: Secondary | ICD-10-CM | POA: Diagnosis not present

## 2024-03-19 LAB — RAD ONC ARIA SESSION SUMMARY
Course Elapsed Days: 15
Plan Fractions Treated to Date: 11
Plan Prescribed Dose Per Fraction: 2.66 Gy
Plan Total Fractions Prescribed: 16
Plan Total Prescribed Dose: 42.56 Gy
Reference Point Dosage Given to Date: 29.26 Gy
Reference Point Session Dosage Given: 2.66 Gy
Session Number: 11

## 2024-03-22 ENCOUNTER — Ambulatory Visit
Admission: RE | Admit: 2024-03-22 | Discharge: 2024-03-22 | Disposition: A | Discharge status: Home / Self Care | Source: Ambulatory Visit | Attending: Radiation Oncology | Admitting: Radiation Oncology

## 2024-03-22 ENCOUNTER — Other Ambulatory Visit: Payer: Self-pay

## 2024-03-22 DIAGNOSIS — Z923 Personal history of irradiation: Secondary | ICD-10-CM | POA: Diagnosis not present

## 2024-03-22 DIAGNOSIS — R232 Flushing: Secondary | ICD-10-CM | POA: Diagnosis not present

## 2024-03-22 DIAGNOSIS — Z170 Estrogen receptor positive status [ER+]: Secondary | ICD-10-CM | POA: Insufficient documentation

## 2024-03-22 DIAGNOSIS — C50812 Malignant neoplasm of overlapping sites of left female breast: Secondary | ICD-10-CM | POA: Diagnosis present

## 2024-03-22 DIAGNOSIS — Z510 Encounter for antineoplastic radiation therapy: Secondary | ICD-10-CM | POA: Insufficient documentation

## 2024-03-22 DIAGNOSIS — Z79899 Other long term (current) drug therapy: Secondary | ICD-10-CM | POA: Diagnosis not present

## 2024-03-22 LAB — RAD ONC ARIA SESSION SUMMARY
Course Elapsed Days: 18
Plan Fractions Treated to Date: 12
Plan Prescribed Dose Per Fraction: 2.66 Gy
Plan Total Fractions Prescribed: 16
Plan Total Prescribed Dose: 42.56 Gy
Reference Point Dosage Given to Date: 31.92 Gy
Reference Point Session Dosage Given: 2.66 Gy
Session Number: 12

## 2024-03-23 ENCOUNTER — Other Ambulatory Visit: Payer: Self-pay

## 2024-03-23 ENCOUNTER — Ambulatory Visit
Admission: RE | Admit: 2024-03-23 | Discharge: 2024-03-23 | Disposition: A | Discharge status: Home / Self Care | Source: Ambulatory Visit | Attending: Radiation Oncology | Admitting: Radiation Oncology

## 2024-03-23 DIAGNOSIS — C50812 Malignant neoplasm of overlapping sites of left female breast: Secondary | ICD-10-CM | POA: Diagnosis not present

## 2024-03-23 LAB — RAD ONC ARIA SESSION SUMMARY
Course Elapsed Days: 19
Plan Fractions Treated to Date: 13
Plan Prescribed Dose Per Fraction: 2.66 Gy
Plan Total Fractions Prescribed: 16
Plan Total Prescribed Dose: 42.56 Gy
Reference Point Dosage Given to Date: 34.58 Gy
Reference Point Session Dosage Given: 2.66 Gy
Session Number: 13

## 2024-03-24 ENCOUNTER — Other Ambulatory Visit: Payer: Self-pay

## 2024-03-24 ENCOUNTER — Ambulatory Visit
Admission: RE | Admit: 2024-03-24 | Discharge: 2024-03-24 | Disposition: A | Discharge status: Home / Self Care | Source: Ambulatory Visit | Attending: Radiation Oncology | Admitting: Radiation Oncology

## 2024-03-24 DIAGNOSIS — C50812 Malignant neoplasm of overlapping sites of left female breast: Secondary | ICD-10-CM | POA: Diagnosis not present

## 2024-03-24 LAB — RAD ONC ARIA SESSION SUMMARY
Course Elapsed Days: 20
Plan Fractions Treated to Date: 14
Plan Prescribed Dose Per Fraction: 2.66 Gy
Plan Total Fractions Prescribed: 16
Plan Total Prescribed Dose: 42.56 Gy
Reference Point Dosage Given to Date: 37.24 Gy
Reference Point Session Dosage Given: 2.66 Gy
Session Number: 14

## 2024-03-25 ENCOUNTER — Other Ambulatory Visit: Payer: Self-pay

## 2024-03-25 ENCOUNTER — Ambulatory Visit
Admission: RE | Admit: 2024-03-25 | Discharge: 2024-03-25 | Disposition: A | Discharge status: Home / Self Care | Source: Ambulatory Visit | Attending: Radiation Oncology | Admitting: Radiation Oncology

## 2024-03-25 DIAGNOSIS — C50812 Malignant neoplasm of overlapping sites of left female breast: Secondary | ICD-10-CM | POA: Diagnosis not present

## 2024-03-25 LAB — RAD ONC ARIA SESSION SUMMARY
Course Elapsed Days: 21
Plan Fractions Treated to Date: 15
Plan Prescribed Dose Per Fraction: 2.66 Gy
Plan Total Fractions Prescribed: 16
Plan Total Prescribed Dose: 42.56 Gy
Reference Point Dosage Given to Date: 39.9 Gy
Reference Point Session Dosage Given: 2.66 Gy
Session Number: 15

## 2024-03-26 ENCOUNTER — Ambulatory Visit
Admission: RE | Admit: 2024-03-26 | Discharge: 2024-03-26 | Disposition: A | Discharge status: Home / Self Care | Source: Ambulatory Visit | Attending: Radiation Oncology | Admitting: Radiation Oncology

## 2024-03-26 ENCOUNTER — Ambulatory Visit

## 2024-03-26 ENCOUNTER — Other Ambulatory Visit: Payer: Self-pay

## 2024-03-26 DIAGNOSIS — C50812 Malignant neoplasm of overlapping sites of left female breast: Secondary | ICD-10-CM

## 2024-03-26 LAB — RAD ONC ARIA SESSION SUMMARY
Course Elapsed Days: 22
Plan Fractions Treated to Date: 16
Plan Prescribed Dose Per Fraction: 2.66 Gy
Plan Total Fractions Prescribed: 16
Plan Total Prescribed Dose: 42.56 Gy
Reference Point Dosage Given to Date: 42.56 Gy
Reference Point Session Dosage Given: 2.66 Gy
Session Number: 16

## 2024-03-26 MED ORDER — RADIAPLEXRX EX GEL: Freq: Once | CUTANEOUS | Status: AC

## 2024-03-29 ENCOUNTER — Ambulatory Visit

## 2024-03-29 ENCOUNTER — Ambulatory Visit
Admission: RE | Admit: 2024-03-29 | Discharge: 2024-03-29 | Disposition: A | Discharge status: Home / Self Care | Source: Ambulatory Visit | Attending: Radiation Oncology | Admitting: Radiation Oncology

## 2024-03-29 ENCOUNTER — Other Ambulatory Visit: Payer: Self-pay

## 2024-03-29 DIAGNOSIS — C50812 Malignant neoplasm of overlapping sites of left female breast: Secondary | ICD-10-CM | POA: Diagnosis not present

## 2024-03-29 LAB — RAD ONC ARIA SESSION SUMMARY
Course Elapsed Days: 25
Plan Fractions Treated to Date: 1
Plan Prescribed Dose Per Fraction: 2 Gy
Plan Total Fractions Prescribed: 4
Plan Total Prescribed Dose: 8 Gy
Reference Point Dosage Given to Date: 2 Gy
Reference Point Session Dosage Given: 2 Gy
Session Number: 17

## 2024-03-30 ENCOUNTER — Other Ambulatory Visit: Payer: Self-pay

## 2024-03-30 ENCOUNTER — Ambulatory Visit
Admission: RE | Admit: 2024-03-30 | Discharge: 2024-03-30 | Disposition: A | Discharge status: Home / Self Care | Source: Ambulatory Visit | Attending: Radiation Oncology | Admitting: Radiation Oncology

## 2024-03-30 DIAGNOSIS — C50812 Malignant neoplasm of overlapping sites of left female breast: Secondary | ICD-10-CM | POA: Diagnosis not present

## 2024-03-30 LAB — RAD ONC ARIA SESSION SUMMARY
Course Elapsed Days: 26
Plan Fractions Treated to Date: 2
Plan Prescribed Dose Per Fraction: 2 Gy
Plan Total Fractions Prescribed: 4
Plan Total Prescribed Dose: 8 Gy
Reference Point Dosage Given to Date: 4 Gy
Reference Point Session Dosage Given: 2 Gy
Session Number: 18

## 2024-03-31 ENCOUNTER — Ambulatory Visit
Admission: RE | Admit: 2024-03-31 | Discharge: 2024-03-31 | Disposition: A | Discharge status: Home / Self Care | Source: Ambulatory Visit | Attending: Radiation Oncology | Admitting: Radiation Oncology

## 2024-03-31 ENCOUNTER — Ambulatory Visit

## 2024-03-31 ENCOUNTER — Other Ambulatory Visit: Payer: Self-pay

## 2024-03-31 DIAGNOSIS — C50812 Malignant neoplasm of overlapping sites of left female breast: Secondary | ICD-10-CM | POA: Diagnosis not present

## 2024-03-31 LAB — RAD ONC ARIA SESSION SUMMARY
Course Elapsed Days: 27
Plan Fractions Treated to Date: 3
Plan Prescribed Dose Per Fraction: 2 Gy
Plan Total Fractions Prescribed: 4
Plan Total Prescribed Dose: 8 Gy
Reference Point Dosage Given to Date: 6 Gy
Reference Point Session Dosage Given: 2 Gy
Session Number: 19

## 2024-04-01 ENCOUNTER — Other Ambulatory Visit: Payer: Self-pay

## 2024-04-01 ENCOUNTER — Ambulatory Visit
Admission: RE | Admit: 2024-04-01 | Discharge: 2024-04-01 | Disposition: A | Discharge status: Home / Self Care | Source: Ambulatory Visit | Attending: Radiation Oncology | Admitting: Radiation Oncology

## 2024-04-01 ENCOUNTER — Inpatient Hospital Stay: Attending: Hematology and Oncology | Admitting: Hematology and Oncology

## 2024-04-01 VITALS — BP 124/65 | HR 70 | Temp 97.9°F | Resp 17 | Ht 60.0 in | Wt 177.2 lb

## 2024-04-01 DIAGNOSIS — Z170 Estrogen receptor positive status [ER+]: Secondary | ICD-10-CM | POA: Insufficient documentation

## 2024-04-01 DIAGNOSIS — R232 Flushing: Secondary | ICD-10-CM | POA: Insufficient documentation

## 2024-04-01 DIAGNOSIS — Z79899 Other long term (current) drug therapy: Secondary | ICD-10-CM | POA: Insufficient documentation

## 2024-04-01 DIAGNOSIS — Z923 Personal history of irradiation: Secondary | ICD-10-CM | POA: Insufficient documentation

## 2024-04-01 DIAGNOSIS — C50812 Malignant neoplasm of overlapping sites of left female breast: Secondary | ICD-10-CM | POA: Insufficient documentation

## 2024-04-01 LAB — RAD ONC ARIA SESSION SUMMARY
Course Elapsed Days: 28
Plan Fractions Treated to Date: 4
Plan Prescribed Dose Per Fraction: 2 Gy
Plan Total Fractions Prescribed: 4
Plan Total Prescribed Dose: 8 Gy
Reference Point Dosage Given to Date: 8 Gy
Reference Point Session Dosage Given: 2 Gy
Session Number: 20

## 2024-04-01 MED ORDER — ANASTROZOLE 1 MG PO TABS
1.0000 mg | ORAL_TABLET | Freq: Every day | ORAL | Status: AC
Start: 2024-04-01 — End: ?

## 2024-04-01 NOTE — Progress Notes (Signed)
 Patient Care Team: Avelina Greig BRAVO, MD as PCP - General Tyree Nanetta SAILOR, RN as Oncology Nurse Navigator Odean Potts, MD as Consulting Physician (Hematology and Oncology) Vanderbilt Ned, MD as Consulting Physician (General Surgery) Dewey Rush, MD as Consulting Physician (Radiation Oncology)  DIAGNOSIS:  Encounter Diagnosis  Name Primary?   Malignant neoplasm of overlapping sites of left breast in female, estrogen receptor positive (HCC) Yes    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)  05/23/2023 Initial Biopsy   Left nipple punch biopsy: Infiltrating carcinoma consistent with breast origin (CK7 GATA3 and ER positive) ER 30%, PR 20%, HER2 1+ (send T4, N0, M0, stage IIIb)    06/02/2023 Breast MRI   Breast MRI: 2.8 cm biopsy-proven malignancy left nipple retroareolar left breast, 5 cm enhancement UOQ left breast posterior superior to the biopsy-proven malignancy, highly suspicious left breast enhancement spanning 9.8 cm.    06/09/2023 Initial Diagnosis   Malignant neoplasm of overlapping sites of left female breast (HCC)   06/10/2023 Cancer Staging   Staging form: Breast, AJCC 8th Edition - Clinical: cT4, cN0, cM0, GX, ER+, PR+, HER2- - Signed by Odean Potts, MD on 06/10/2023 Stage prefix: Initial diagnosis Histologic grading system: 3 grade system   06/18/2023 Initial Biopsy   Left breast biopsy 2:30 position: Grade 2 IDC with DCIS ER 75%, PR 75%, Ki67 20%, HER2 1+ negative   06/23/2023 Imaging   Staging Scans: CT C/A/P :IMPRESSION: 1. Asymmetric masslike breast tissue in the lateral left breast measures 2.2 x 1.2 cm, compatible with known primary breast malignancy. 2. Prominent left axillary lymph nodes measure up to 5 mm in short axis, nonspecific consider attention on dedicated ultrasound. 3. No  convincing evidence of metastatic disease in the chest, abdomen or pelvis. 4. Ill-defined hypodensity in the  pancreatic head/uncinate process, nonspecific but favored to reflect fatty infiltration consider attention on follow-up imaging versus further evaluation by pre and postcontrast enhanced MRC. 5. Nonobstructive 3  mm right interpolar renal stone.  BONE SCAN:  IMPRESSION: 1. No evidence for osseous metastatic disease 2. Degenerative uptake at the left hip, feet and ankles.    07/21/2023 -  Neo-Adjuvant Chemotherapy   Neoadjuvant chemotherapy with dose dense Adriamycin  and Cytoxan  x 4 followed by Taxol  x 12 started 07/21/2023      CHIEF COMPLIANT: Follow-up after radiation is complete  HISTORY OF PRESENT ILLNESS:  History of Present Illness Kelsey Carlson is a 58 year old female with breast cancer who presents for follow-up after completing radiation therapy.  She has completed radiation therapy for breast cancer and feels well overall, with a minor sensation of a 'tan line' from the treatment. She is resuming anastrozole , which she had paused during chemotherapy. She experiences occasional hot flashes and joint stiffness, known side effects of anastrozole . She also feels 'prickly' when hot, which she attributes to changes in her autonomic nervous system post-chemotherapy.     ALLERGIES:  is allergic to chlorhexidine .  MEDICATIONS:  Current Outpatient Medications  Medication Sig Dispense Refill   albuterol  (VENTOLIN  HFA) 108 (90 Base) MCG/ACT inhaler Inhale 2 puffs into the lungs every 6 (six) hours as needed for wheezing or shortness of breath. 8 g 2   ibuprofen  (ADVIL ) 800 MG tablet Take 1 tablet (800 mg total) by mouth every 8 (eight) hours as needed. 30 tablet 0   metroNIDAZOLE  (METROCREAM ) 0.75 % cream Apply to face one to two times a day for rosacea.  45 g 11   naproxen  sodium (ALEVE ) 220 MG tablet Take 220 mg by mouth.     pantoprazole  (PROTONIX ) 40 MG tablet TAKE 1 TABLET BY MOUTH EVERY DAY 90 tablet 1   triamcinolone  ointment (KENALOG ) 0.5 % Apply 1 Application topically 2 (two)  times daily. 30 g 0   No current facility-administered medications for this visit.    PHYSICAL EXAMINATION: ECOG PERFORMANCE STATUS: 1 - Symptomatic but completely ambulatory  Vitals:   04/01/24 1518  BP: 124/65  Pulse: 70  Resp: 17  Temp: 97.9 F (36.6 C)  SpO2: 100%   Filed Weights   04/01/24 1518  Weight: 177 lb 3.2 oz (80.4 kg)    LABORATORY DATA:  I have reviewed the data as listed    Latest Ref Rng & Units 12/01/2023   10:29 AM 11/24/2023   11:06 AM 11/17/2023    9:32 AM  CMP  Glucose 70 - 99 mg/dL 896  80  887   BUN 6 - 20 mg/dL 10  7  10    Creatinine 0.44 - 1.00 mg/dL 9.35  9.38  9.23   Sodium 135 - 145 mmol/L 141  141  137   Potassium 3.5 - 5.1 mmol/L 3.9  3.9  3.5   Chloride 98 - 111 mmol/L 110  109  105   CO2 22 - 32 mmol/L 27  27  26    Calcium 8.9 - 10.3 mg/dL 9.2  9.3  9.2   Total Protein 6.5 - 8.1 g/dL 5.7  5.9  6.2   Total Bilirubin 0.0 - 1.2 mg/dL 0.3  0.4  0.6   Alkaline Phos 38 - 126 U/L 61  58  65   AST 15 - 41 U/L 18  18  18    ALT 0 - 44 U/L 22  20  22      Lab Results  Component Value Date   WBC 4.5 12/01/2023   HGB 10.6 (L) 12/01/2023   HCT 31.5 (L) 12/01/2023   MCV 95.5 12/01/2023   PLT 330 12/01/2023   NEUTROABS 2.6 12/01/2023    ASSESSMENT & PLAN:  Malignant neoplasm of overlapping sites of left female breast (HCC) 05/23/2023: Left nipple punch biopsy: Infiltrating carcinoma consistent with breast origin (CK7 GATA3 and ER positive) ER 30%, PR 20%, HER2 1+ (send T4, N0, M0, stage IIIb) 06/02/2023: Breast MRI: 2.8 cm biopsy-proven malignancy left nipple retroareolar left breast, 5 cm enhancement UOQ left breast posterior superior to the biopsy-proven malignancy, highly suspicious left breast enhancement spanning 9.8 cm. 06/18/2023: Left breast biopsy 2:30 position: Grade 2 IDC with DCIS ER 75%, PR 75%, Ki67 20%, HER2 1+ negative   Treatment plan: Biopsy on the 5 cm of enhancement: Grade 2 IDC, ER 75%, PR 75%, Ki 67: 20%, HER 2 1+ Neg CT  scans and bone scan: 06/18/23:Left breast mass 2.2 cm, prom axillary LN, no distant mets, non specific pancreatic head/ uncinate mass 07/02/2023: Oncotype DX: 30 (distant recurrence at 9 years: 19%) Neoadjuvant chemotherapy with dose dense Adriamycin  and Cytoxan  x 4 followed by Taxol  x 12 started 07/21/2023 01/06/2024: Left lumpectomy: Grade 2 IDC 1.5 cm with DCIS, margins negative, 0/4 lymph nodes negative, ER 75%, PR 75%, Ki-67 20%, HER2 negative  01/13/2024: Bilateral mammoplasty reduction: Benign adjuvant radiation 03/08/2024-04/01/2024 Adjuvant antiestrogen therapy 04/19/2024 ------------------------------------------------------------------------------------------------------------------------------------ Chemo-induced peripheral neuropathy: Grandson was born 10/27/2023  Anastrozole  counseling: We discussed the risks and benefits of anti-estrogen therapy with aromatase inhibitors. These include but not limited to insomnia, hot flashes, mood changes, vaginal  dryness, bone density loss, and weight gain. We strongly believe that the benefits far outweigh the risks. Patient understands these risks and consented to starting treatment. Planned treatment duration is 5-7 years. Patient had some pills leftover from previously.  She will start taking them.  We also discussed the role of Ribociclib based upon nightly clinical trial.  I provided her with information on this.  She will think about this and will inform us  of her decision when she comes back for survivorship care plan visit.  Breast cancer surveillance: Alternating mammograms and MRIs and guardant reveal for MRD testing  Return to clinic in 3 months for survivorship care plan visit     No orders of the defined types were placed in this encounter.  The patient has a good understanding of the overall plan. she agrees with it. she will call with any problems that may develop before the next visit here.  I personally spent a total of 45 minutes  in the care of the patient today including preparing to see the patient, getting/reviewing separately obtained history, performing a medically appropriate exam/evaluation, counseling and educating, placing orders, referring and communicating with other health care professionals, documenting clinical information in the EHR, independently interpreting results, communicating results, and coordinating care.   Viinay K Zarion Oliff, MD 04/01/24

## 2024-04-01 NOTE — Assessment & Plan Note (Signed)
 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 06/18/2023: Left breast biopsy 2:30 position: Grade 2 IDC with DCIS ER 75%, PR 75%, Ki67 20%, HER2 1+ negative   Treatment plan: Biopsy on the 5 cm of enhancement: Grade 2 IDC, ER 75%, PR 75%, Ki 67: 20%, HER 2 1+ Neg CT scans and bone scan: 06/18/23:Left breast mass 2.2 cm, prom axillary LN, no distant mets, non specific pancreatic head/ uncinate mass 07/02/2023: Oncotype DX: 30 (distant recurrence at 9 years: 19%) Neoadjuvant chemotherapy with dose dense Adriamycin  and Cytoxan  x 4 followed by Taxol  x 12 started 07/21/2023 01/06/2024: Left lumpectomy: Grade 2 IDC 1.5 cm with DCIS, margins negative, 0/4 lymph nodes negative, ER 75%, PR 75%, Ki-67 20%, HER2 negative  01/13/2024: Bilateral mammoplasty reduction: Benign adjuvant radiation 03/08/2024-04/01/2024 Adjuvant antiestrogen therapy 04/19/2024 ------------------------------------------------------------------------------------------------------------------------------------ Chemo-induced peripheral neuropathy: Grandson was born 10/27/2023  Anastrozole  counseling: We discussed the risks and benefits of anti-estrogen therapy with aromatase inhibitors. These include but not limited to insomnia, hot flashes, mood changes, vaginal dryness, bone density loss, and weight gain. We strongly believe that the benefits far outweigh the risks. Patient understands these risks and consented to starting treatment. Planned treatment duration is 5-7 years.  Return to clinic in 3 months for survivorship care plan visit

## 2024-04-02 ENCOUNTER — Encounter: Payer: Self-pay | Admitting: *Deleted

## 2024-04-02 NOTE — Radiation Completion Notes (Addendum)
  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   Intent: Curative     ==========DELIVERED PLANS==========  First Treatment Date: 2024-03-04 Last Treatment Date: 2024-04-01   Plan Name: Breast_L_BH Site: Breast, Left Technique: 3D Mode: Photon Dose Per Fraction: 2.66 Gy Prescribed Dose (Delivered / Prescribed): 42.56 Gy / 42.56 Gy Prescribed Fxs (Delivered / Prescribed): 16 / 16   Plan Name: Brst_L_Bst_BH Site: Breast, Left Technique: 3D Mode: Photon Dose Per Fraction: 2 Gy Prescribed Dose (Delivered / Prescribed): 8 Gy / 8 Gy Prescribed Fxs (Delivered / Prescribed): 4 / 4     ==========ON TREATMENT VISIT DATES========== 2024-03-05, 2024-03-12, 2024-03-19, 2024-03-26    See weekly On Treatment Notes in Epic for details in the Media tab (listed as Progress notes on the On Treatment Visit Dates listed above). The patient tolerated radiation. She developed fatigue and anticipated skin changes in the treatment field.   The patient will receive a call in about one month from the radiation oncology department. She will continue follow up with Dr. Gudena as well.      Donald KYM Husband, PAC

## 2024-04-02 NOTE — Progress Notes (Signed)
 Per MD request, RN placed [facility] orders via their portal.

## 2024-04-22 ENCOUNTER — Encounter: Payer: Self-pay | Admitting: Hematology and Oncology

## 2024-04-22 NOTE — Progress Notes (Signed)
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   First Treatment Date: 2024-03-04 Last Treatment Date: 2024-04-01   Plan Name: Breast_L_BH Site: Breast, Left Technique: 3D Mode: Photon Dose Per Fraction: 2.66 Gy Prescribed Dose (Delivered / Prescribed): 42.56 Gy / 42.56 Gy Prescribed Fxs (Delivered / Prescribed): 16 / 16   Plan Name: Brst_L_Bst_BH Site: Breast, Left Technique: 3D Mode: Photon Dose Per Fraction: 2 Gy Prescribed Dose (Delivered / Prescribed): 8 Gy / 8 Gy Prescribed Fxs (Delivered / Prescribed): 4 / 4  The patient was available for call today.   Symptoms of fatigue have not improved since completing therapy.  Symptoms of skin changes have improved since completing therapy.  The patient was encouraged to avoid sun exposure in the area of prior treatment for up to one year following radiation with either sunscreen or by the style of clothing worn in the sun.  The patient has scheduled follow up with her medical oncologist Dr. Gudena for ongoing surveillance, and was encouraged to call if she develops concerns or questions regarding radiation.

## 2024-04-23 ENCOUNTER — Encounter: Payer: Self-pay | Admitting: Hematology and Oncology

## 2024-05-03 ENCOUNTER — Ambulatory Visit
Admission: RE | Admit: 2024-05-03 | Discharge: 2024-05-03 | Disposition: A | Source: Ambulatory Visit | Attending: Radiation Oncology

## 2024-05-03 DIAGNOSIS — Z170 Estrogen receptor positive status [ER+]: Secondary | ICD-10-CM

## 2024-05-12 ENCOUNTER — Encounter: Payer: Self-pay | Admitting: Hematology and Oncology

## 2024-06-10 ENCOUNTER — Ambulatory Visit: Admitting: Dermatology

## 2024-06-10 DIAGNOSIS — L821 Other seborrheic keratosis: Secondary | ICD-10-CM | POA: Diagnosis not present

## 2024-06-10 DIAGNOSIS — D225 Melanocytic nevi of trunk: Secondary | ICD-10-CM

## 2024-06-10 DIAGNOSIS — L814 Other melanin hyperpigmentation: Secondary | ICD-10-CM | POA: Diagnosis not present

## 2024-06-10 DIAGNOSIS — L820 Inflamed seborrheic keratosis: Secondary | ICD-10-CM

## 2024-06-10 NOTE — Patient Instructions (Addendum)
 Seborrheic Keratosis  What causes seborrheic keratoses? Seborrheic keratoses are harmless, common skin growths that first appear during adult life.  As time goes by, more growths appear.  Some people may develop a large number of them.  Seborrheic keratoses appear on both covered and uncovered body parts.  They are not caused by sunlight.  The tendency to develop seborrheic keratoses can be inherited.  They vary in color from skin-colored to gray, brown, or even black.  They can be either smooth or have a rough, warty surface.   Seborrheic keratoses are superficial and look as if they were stuck on the skin.  Under the microscope this type of keratosis looks like layers upon layers of skin.  That is why at times the top layer may seem to fall off, but the rest of the growth remains and re-grows.    Treatment Seborrheic keratoses do not need to be treated, but can easily be removed in the office.  Seborrheic keratoses often cause symptoms when they rub on clothing or jewelry.  Lesions can be in the way of shaving.  If they become inflamed, they can cause itching, soreness, or burning.  Removal of a seborrheic keratosis can be accomplished by freezing, burning, or surgery. If any spot bleeds, scabs, or grows rapidly, please return to have it checked, as these can be an indication of a skin cancer.  Due to recent changes in healthcare laws, you may see results of your pathology and/or laboratory studies on MyChart before the doctors have had a chance to review them. We understand that in some cases there may be results that are confusing or concerning to you. Please understand that not all results are received at the same time and often the doctors may need to interpret multiple results in order to provide you with the best plan of care or course of treatment. Therefore, we ask that you please give us  2 business days to thoroughly review all your results before contacting the office for clarification. Should  we see a critical lab result, you will be contacted sooner.   If You Need Anything After Your Visit  If you have any questions or concerns for your doctor, please call our main line at [phone] and press option 4 to reach your doctor's medical assistant. If no one answers, please leave a voicemail as directed and we will return your call as soon as possible. Messages left after 4 pm will be answered the following business day.   You may also send us  a message via MyChart. We typically respond to MyChart messages within 1-2 business days.  For prescription refills, please ask your pharmacy to contact our office. Our fax number is [phone].  If you have an urgent issue when the clinic is closed that cannot wait until the next business day, you can page your doctor at the number below.    Please note that while we do our best to be available for urgent issues outside of office hours, we are not available 24/7.   If you have an urgent issue and are unable to reach us , you may choose to seek medical care at your doctor's office, retail clinic, urgent care center, or emergency room.  If you have a medical emergency, please immediately call 911 or go to the emergency department.  Pager Numbers  - Dr. Hester: [phone]  - Dr. Jackquline: [phone]  - Dr. Claudene: [phone]   - Dr. Raymund: [phone]  In the event of inclement weather,  please call our main line at [phone] for an update on the status of any delays or closures.  Dermatology Medication Tips: Please keep the boxes that topical medications come in in order to help keep track of the instructions about where and how to use these. Pharmacies typically print the medication instructions only on the boxes and not directly on the medication tubes.   If your medication is too expensive, please contact our office at [phone] option 4 or send us  a message through MyChart.   We are unable to tell what your co-pay  for medications will be in advance as this is different depending on your insurance coverage. However, we may be able to find a substitute medication at lower cost or fill out paperwork to get insurance to cover a needed medication.   If a prior authorization is required to get your medication covered by your insurance company, please allow us  1-2 business days to complete this process.  Drug prices often vary depending on where the prescription is filled and some pharmacies may offer cheaper prices.  The website www.goodrx.com contains coupons for medications through different pharmacies. The prices here do not account for what the cost may be with help from insurance (it may be cheaper with your insurance), but the website can give you the price if you did not use any insurance.  - You can print the associated coupon and take it with your prescription to the pharmacy.  - You may also stop by our office during regular business hours and pick up a GoodRx coupon card.  - If you need your prescription sent electronically to a different pharmacy, notify our office through [facility] or by phone at [phone] option 4.     Si Usted Necesita Algo Despus de Su Visita  Tambin puede enviarnos un mensaje a travs de Clinical cytogeneticist. Por lo general respondemos a los mensajes de MyChart en el transcurso de 1 a 2 das hbiles.  Para renovar recetas, por favor pida a su farmacia que se ponga en contacto con nuestra oficina. Randi lakes de fax es [place] [phone].  Si tiene un asunto urgente cuando la clnica est cerrada y que no puede esperar hasta el siguiente da hbil, puede llamar/localizar a su doctor(a) al nmero que aparece a continuacin.   Por favor, tenga en cuenta que aunque hacemos todo lo posible para estar disponibles para asuntos urgentes fuera del horario de [place], no estamos disponibles las 24 horas del da, los 7 [address]  [address] de la [place].   Si tiene un problema urgente y no puede  comunicarse con nosotros, puede optar por buscar atencin mdica  en el consultorio de su doctor(a), en una clnica privada, en un centro de atencin urgente o en una sala de emergencias.  Si tiene Engineer, drilling, por favor llame inmediatamente al 911 o vaya a la sala de emergencias.  Nmeros de bper  - Dr. Hester: [phone]  - Dra. Jackquline: [phone]  - Dr. Claudene: [phone]  - Dra. Kitts: [phone]  En caso de inclemencias del [place], por favor llame a nuestra lnea principal al [phone] para una actualizacin sobre el estado de cualquier retraso o cierre.  Consejos para la medicacin en dermatologa: Por favor, guarde las cajas en las que vienen los medicamentos de uso tpico para ayudarle a seguir las instrucciones sobre dnde y cmo usarlos. Las farmacias generalmente imprimen las instrucciones del medicamento slo en las cajas y no directamente en los tubos del [place].   Si  su medicamento es muy caro, por favor, pngase en contacto con landry rieger llamando al [phone] y presione la opcin 4 o envenos un mensaje a travs de Clinical cytogeneticist.   No podemos decirle cul ser su copago por los medicamentos por adelantado ya que esto es diferente dependiendo de la cobertura de su seguro. Sin embargo, es posible que podamos encontrar un medicamento sustituto a Audiological scientist un formulario para que el seguro cubra el medicamento que se considera necesario.   Si se requiere una autorizacin previa para que su compaa de seguros malta su medicamento, por favor permtanos de 1 a 2 das hbiles para completar este proceso.  Los precios de los medicamentos varan con frecuencia dependiendo del Environmental consultant de dnde se surte la receta y alguna farmacias pueden ofrecer precios ms baratos.  El sitio web www.goodrx.com tiene cupones para medicamentos de Health and safety inspector. Los precios aqu no tienen en cuenta lo que podra costar con la ayuda del seguro (puede ser ms  barato con su seguro), pero el sitio web puede darle el precio si no utiliz Tourist information centre manager.  - Puede imprimir el cupn correspondiente y llevarlo con su receta a la farmacia.  - Tambin puede pasar por nuestra oficina durante el horario de atencin regular y Education officer, museum una tarjeta de cupones de GoodRx.  - Si necesita que su receta se enve electrnicamente a una farmacia diferente, informe a nuestra oficina a travs de MyChart de  o por telfono llamando al [phone] y presione la opcin 4.

## 2024-06-10 NOTE — Progress Notes (Signed)
" ° °  Follow-Up Visit   Subjective  Kelsey Carlson is a 59 y.o. female who presents for the following: spot at chest, has been there for a very long time but has changed color s/p radiation for breast cancer. Also a spot at right neck that she would removed, irritated.    The following portions of the chart were reviewed this encounter and updated as appropriate: medications, allergies, medical history  Review of Systems:  No other skin or systemic complaints except as noted in HPI or Assessment and Plan.  Objective  Well appearing patient in no apparent distress; mood and affect are within normal limits.   A focused examination was performed of the following areas: Chest, neck, back  Relevant exam findings are noted in the Assessment and Plan.  R lower neck x 1 Erythematous stuck-on, waxy papule or plaque  Assessment & Plan   SEBORRHEIC KERATOSIS - Stuck-on, waxy, tan-brown papules at back, chest - waxy stuck on dark brown papule at left medial breast - Benign-appearing - Discussed benign etiology and prognosis. - Observe - Call for any changes  LENTIGINES Exam: scattered tan macules Due to sun exposure Treatment Plan: Benign-appearing, observe. Recommend daily broad spectrum sunscreen SPF 30+ to sun-exposed areas, reapply every 2 hours as needed.  Call for any changes  MELANOCYTIC NEVI Exam: 4 mm medium brown macule at right mid lower back  Treatment Plan: Benign appearing on exam today. Recommend observation. Call clinic for new or changing moles. Recommend daily use of broad spectrum spf 30+ sunscreen to sun-exposed areas.   INFLAMED SEBORRHEIC KERATOSIS R lower neck x 1 Symptomatic, irritating, patient would like treated.  Benign-appearing.  Call clinic for new or changing lesions.   - Destruction of lesion - R lower neck x 1  Destruction method: cryotherapy   Informed consent: discussed and consent obtained   Lesion destroyed using liquid nitrogen: Yes    Region frozen until ice ball extended beyond lesion: Yes   Outcome: patient tolerated procedure well with no complications   Post-procedure details: wound care instructions given   Additional details:  Prior to procedure, discussed risks of blister formation, small wound, skin dyspigmentation, or rare scar following cryotherapy. Recommend Vaseline ointment to treated areas while healing.    Return for TBSE, with Dr. Jackquline, as scheduled.  Kelsey Carlson, RMA, am acting as scribe for Rexene Jackquline, MD .   Documentation: I have reviewed the above documentation for accuracy and completeness, and I agree with the above.  Rexene Jackquline, MD    "

## 2024-06-11 ENCOUNTER — Telehealth: Payer: Self-pay | Admitting: *Deleted

## 2024-06-11 DIAGNOSIS — Z13220 Encounter for screening for lipoid disorders: Secondary | ICD-10-CM

## 2024-06-11 NOTE — Telephone Encounter (Signed)
-----   Message from Veva JINNY Ferrari sent at 06/11/2024  3:49 PM EST ----- Regarding: Lab orders for Wed, 2.4.26 Patient is scheduled for CPX labs, please order future labs, Thanks , Veva

## 2024-06-22 ENCOUNTER — Encounter: Payer: Self-pay | Admitting: Hematology and Oncology

## 2024-06-23 ENCOUNTER — Other Ambulatory Visit

## 2024-06-23 DIAGNOSIS — Z13220 Encounter for screening for lipoid disorders: Secondary | ICD-10-CM

## 2024-06-23 LAB — LIPID PANEL
Cholesterol: 240 mg/dL — ABNORMAL HIGH (ref 28–200)
HDL: 61.2 mg/dL
LDL Cholesterol: 155 mg/dL — ABNORMAL HIGH (ref 10–99)
NonHDL: 179.17
Total CHOL/HDL Ratio: 4
Triglycerides: 119 mg/dL (ref 10.0–149.0)
VLDL: 23.8 mg/dL (ref 0.0–40.0)

## 2024-06-23 LAB — COMPREHENSIVE METABOLIC PANEL WITH GFR
ALT: 19 U/L (ref 3–35)
AST: 17 U/L (ref 5–37)
Albumin: 4.1 g/dL (ref 3.5–5.2)
Alkaline Phosphatase: 90 U/L (ref 39–117)
BUN: 15 mg/dL (ref 6–23)
CO2: 28 meq/L (ref 19–32)
Calcium: 9.8 mg/dL (ref 8.4–10.5)
Chloride: 106 meq/L (ref 96–112)
Creatinine, Ser: 0.73 mg/dL (ref 0.40–1.20)
GFR: 91.03 mL/min
Glucose, Bld: 89 mg/dL (ref 70–99)
Potassium: 4.2 meq/L (ref 3.5–5.1)
Sodium: 143 meq/L (ref 135–145)
Total Bilirubin: 0.6 mg/dL (ref 0.2–1.2)
Total Protein: 6.9 g/dL (ref 6.0–8.3)

## 2024-06-24 ENCOUNTER — Ambulatory Visit: Payer: Self-pay | Admitting: Family Medicine

## 2024-06-24 NOTE — Progress Notes (Signed)
 No critical labs need to be addressed urgently. We will discuss labs in detail at upcoming office visit.

## 2024-06-28 ENCOUNTER — Inpatient Hospital Stay: Attending: Hematology and Oncology | Admitting: Adult Health

## 2024-06-30 ENCOUNTER — Encounter: Admitting: Family Medicine

## 2025-01-10 ENCOUNTER — Encounter: Admitting: Dermatology
